# Patient Record
Sex: Female | Born: 1935 | Race: White | Hispanic: No | Smoking: Never smoker
Health system: Southern US, Community
[De-identification: ages and names within clinical notes are randomized; demographics above are authoritative.]

## PROBLEM LIST (undated history)

## (undated) DIAGNOSIS — I10 Essential (primary) hypertension: Secondary | ICD-10-CM

## (undated) DIAGNOSIS — I4891 Unspecified atrial fibrillation: Secondary | ICD-10-CM

## (undated) HISTORY — DX: Unspecified atrial fibrillation: I48.91

## (undated) HISTORY — DX: Essential (primary) hypertension: I10

---

## 2004-07-19 ENCOUNTER — Ambulatory Visit: Payer: Self-pay

## 2005-03-27 ENCOUNTER — Ambulatory Visit: Payer: Self-pay

## 2005-10-17 ENCOUNTER — Ambulatory Visit: Payer: Self-pay | Admitting: Internal Medicine

## 2010-09-21 DIAGNOSIS — M1990 Unspecified osteoarthritis, unspecified site: Secondary | ICD-10-CM | POA: Insufficient documentation

## 2010-09-21 DIAGNOSIS — M353 Polymyalgia rheumatica: Secondary | ICD-10-CM | POA: Insufficient documentation

## 2010-12-12 ENCOUNTER — Inpatient Hospital Stay: Payer: Self-pay | Admitting: Internal Medicine

## 2010-12-20 ENCOUNTER — Encounter: Payer: Self-pay | Admitting: Internal Medicine

## 2010-12-31 ENCOUNTER — Encounter: Payer: Self-pay | Admitting: Internal Medicine

## 2011-01-31 ENCOUNTER — Encounter: Payer: Self-pay | Admitting: Internal Medicine

## 2011-03-03 ENCOUNTER — Encounter: Payer: Self-pay | Admitting: Internal Medicine

## 2011-08-03 DIAGNOSIS — E559 Vitamin D deficiency, unspecified: Secondary | ICD-10-CM | POA: Insufficient documentation

## 2012-12-22 IMAGING — CT CT HEAD WITHOUT CONTRAST
1 series · 16 of 30 positions shown, 20 images · non-contrast
Comparison: none

REASON FOR EXAM: RLE>RUE weakness
COMMENTS:

PROCEDURE:     CT  - CT HEAD WITHOUT CONTRAST  - December 12, 2010  [DATE]
RESULT:     Comparison:  None
TECHNIQUE: Multiple axial images from the foramen magnum to the vertex were
obtained without IV contrast.

[Series 2: soft tissue · axial · 0.42mm/px · z∈[+888,+1022]mm · 16 of 31 slices shown, 20 images]
[im 2/31  brain]
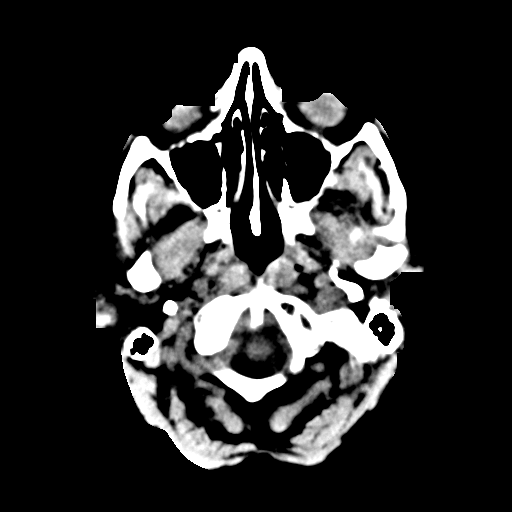
[im 2/31  bone]
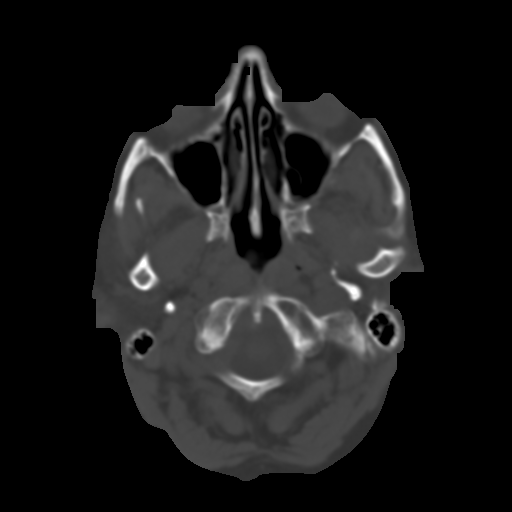
[im 4/31  brain]
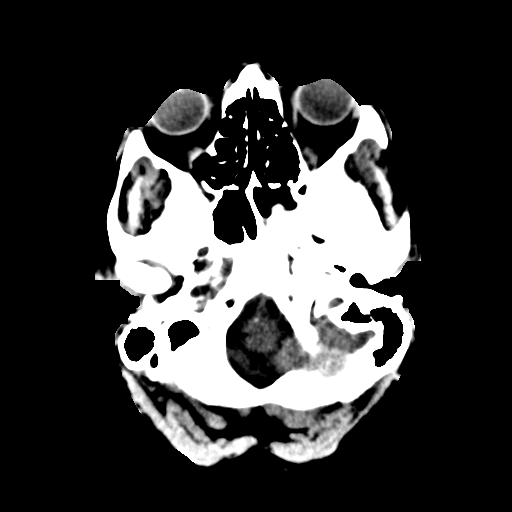
[im 6/31  brain]
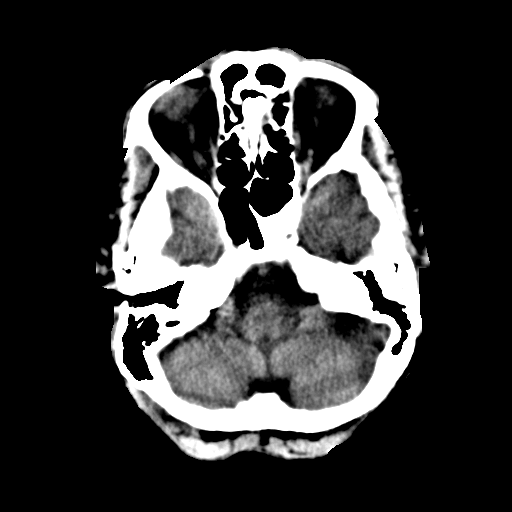
[im 8/31  brain]
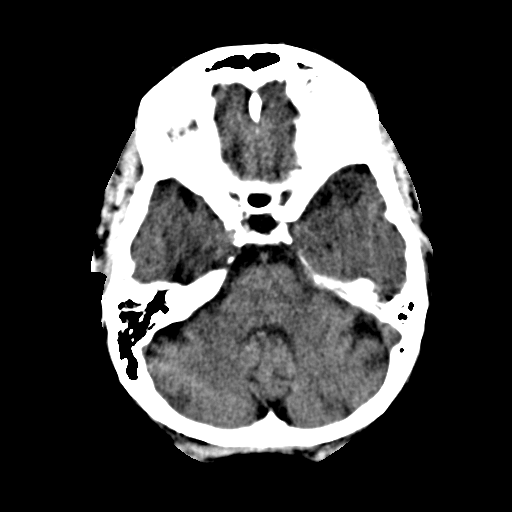
[im 9/31  brain]
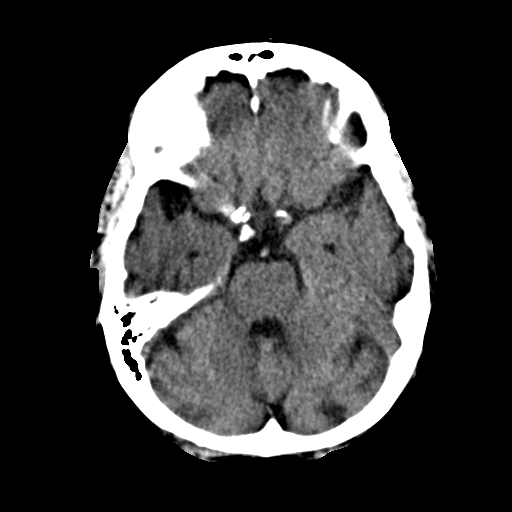
[im 9/31  bone]
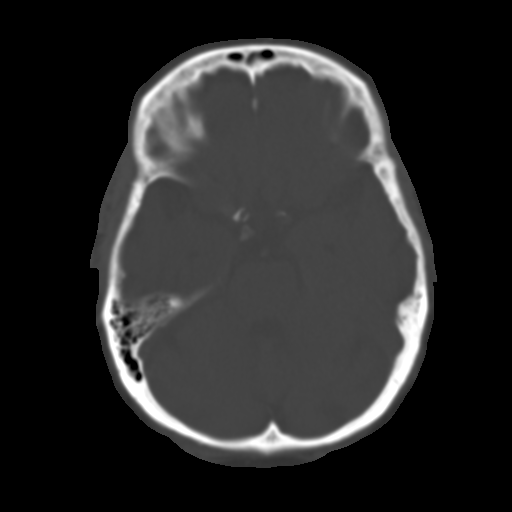
[im 11/31  brain]
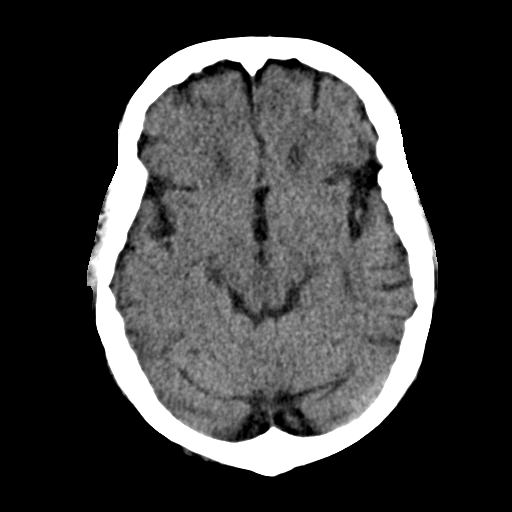
[im 13/31  brain]
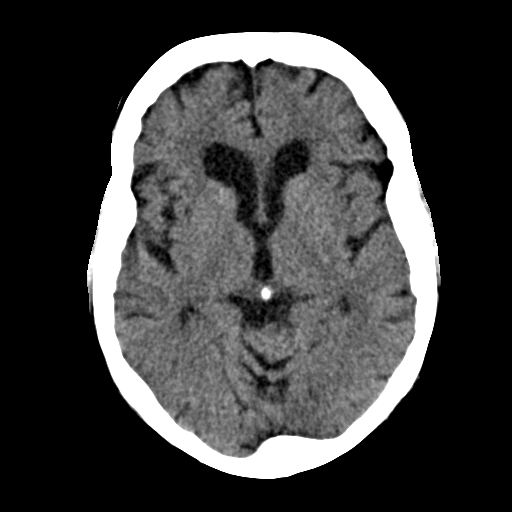
[im 15/31  brain]
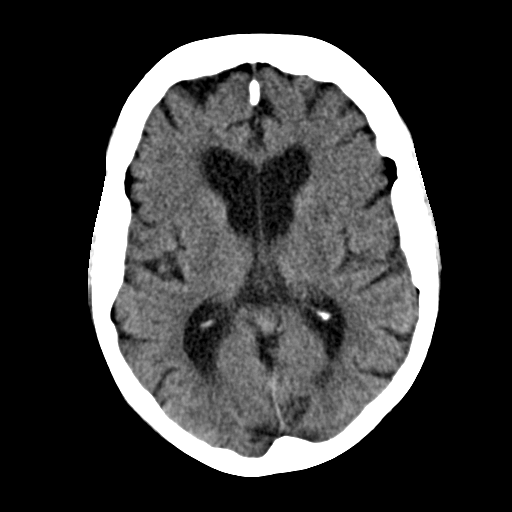
[im 16/31  brain]
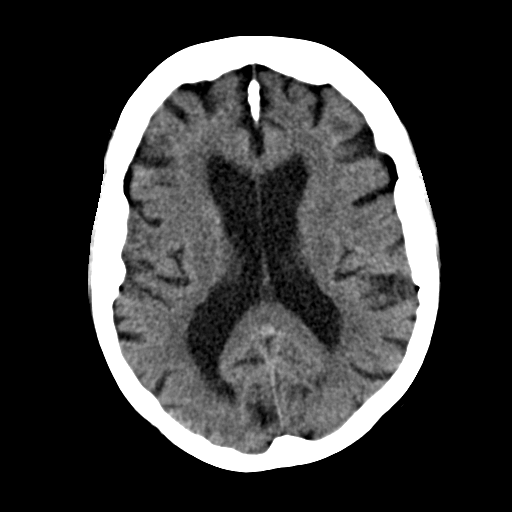
[im 16/31  bone]
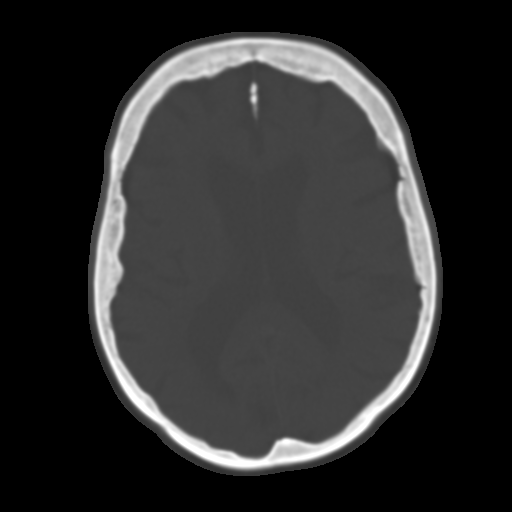
[im 18/31  brain]
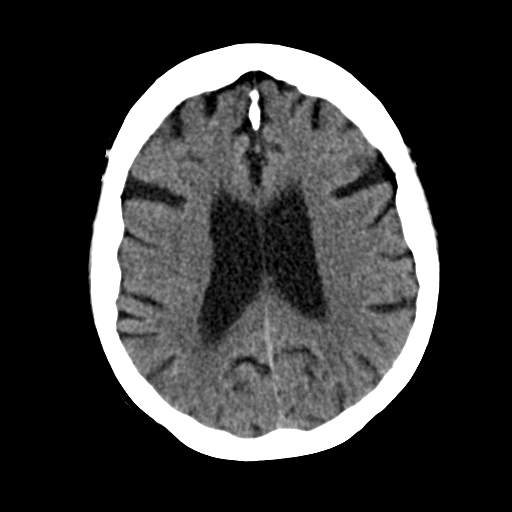
[im 20/31  brain]
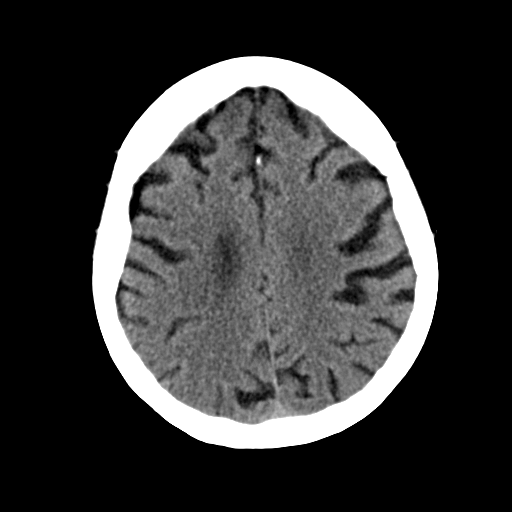
[im 22/31  brain]
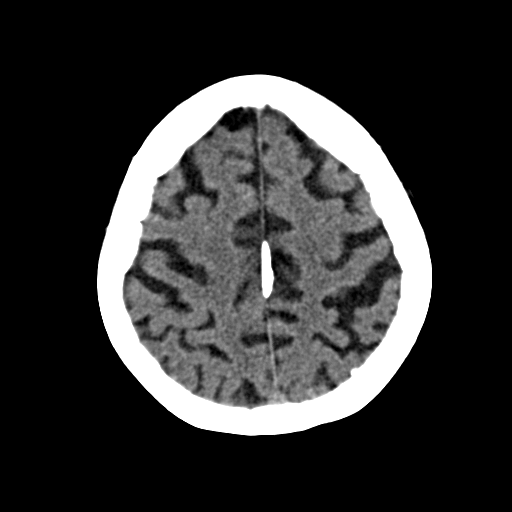
[im 23/31  brain]
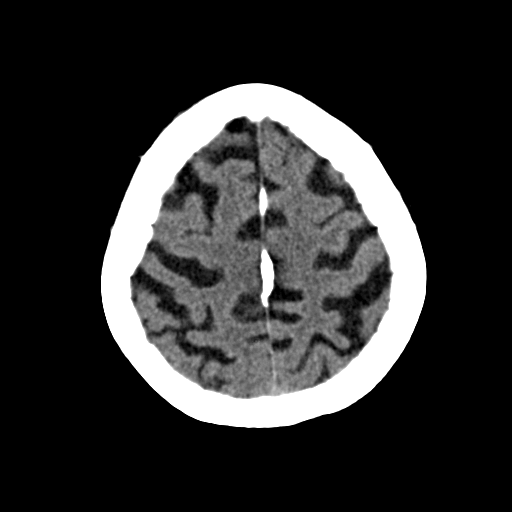
[im 23/31  bone]
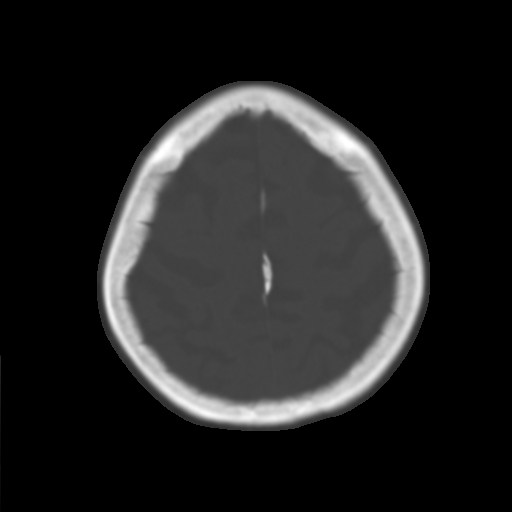
[im 25/31  brain]
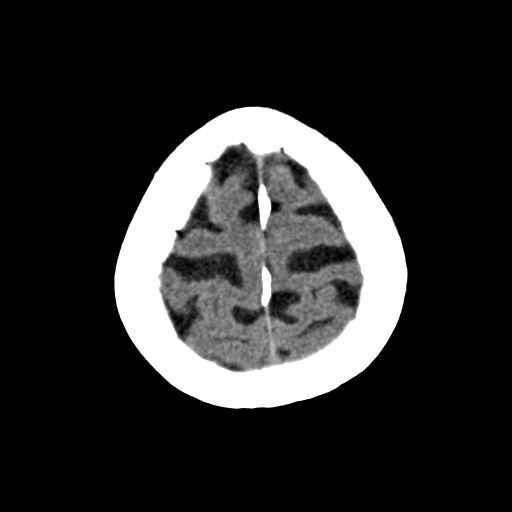
[im 27/31  brain]
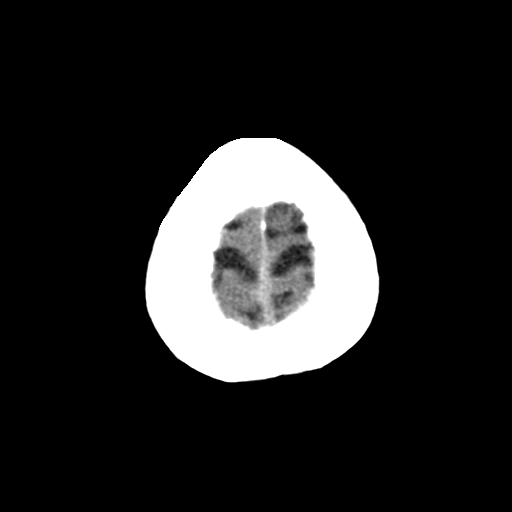
[im 29/31  brain]
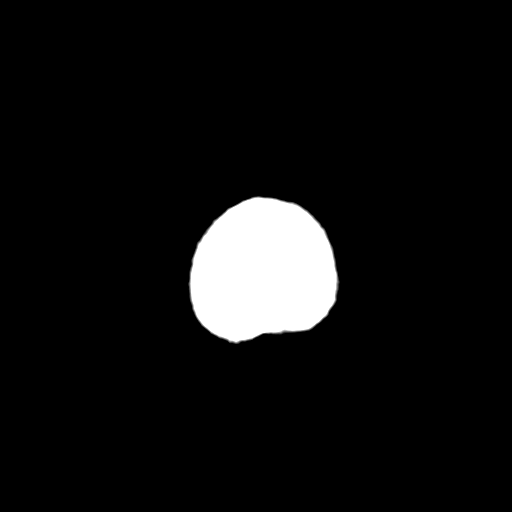

[16 of 30 positions shown; findings below may reference images not displayed]

FINDINGS: There is no evidence of mass effect, midline shift, or extra-axial fluid
collections.  There is no evidence of a space-occupying lesion or
intracranial hemorrhage. There is no evidence of a cortical-based area of
acute infarction. There is generalized cerebral atrophy. There is
periventricular white matter low attenuation likely secondary to
microangiopathy.

The ventricles and sulci are appropriate for the patient's age. The basal
cisterns are patent.

Visualized portions of the orbits are unremarkable. There is a small
air-fluid level in the left sphenoid sinus. Cerebrovascular atherosclerotic
calcifications are noted.

The osseous structures are unremarkable.
IMPRESSION: No acute intracranial process.

## 2013-03-08 ENCOUNTER — Emergency Department: Payer: Self-pay | Admitting: Emergency Medicine

## 2013-03-08 LAB — CBC
HCT: 41.9 % (ref 35.0–47.0)
HGB: 14.1 g/dL (ref 12.0–16.0)
MCH: 33.9 pg (ref 26.0–34.0)
MCHC: 33.7 g/dL (ref 32.0–36.0)
MCV: 101 fL — AB (ref 80–100)
PLATELETS: 213 10*3/uL (ref 150–440)
RBC: 4.16 10*6/uL (ref 3.80–5.20)
RDW: 14.2 % (ref 11.5–14.5)
WBC: 14.4 10*3/uL — ABNORMAL HIGH (ref 3.6–11.0)

## 2013-03-08 LAB — COMPREHENSIVE METABOLIC PANEL
ALK PHOS: 89 U/L
ALT: 25 U/L (ref 12–78)
Albumin: 3.7 g/dL (ref 3.4–5.0)
Anion Gap: 3 — ABNORMAL LOW (ref 7–16)
BILIRUBIN TOTAL: 1.1 mg/dL — AB (ref 0.2–1.0)
BUN: 25 mg/dL — ABNORMAL HIGH (ref 7–18)
CALCIUM: 9.1 mg/dL (ref 8.5–10.1)
Chloride: 109 mmol/L — ABNORMAL HIGH (ref 98–107)
Co2: 25 mmol/L (ref 21–32)
Creatinine: 1.09 mg/dL (ref 0.60–1.30)
EGFR (Non-African Amer.): 46 — ABNORMAL LOW
GFR CALC AF AMER: 53 — AB
Glucose: 101 mg/dL — ABNORMAL HIGH (ref 65–99)
Osmolality: 278 (ref 275–301)
POTASSIUM: 4.3 mmol/L (ref 3.5–5.1)
SGOT(AST): 37 U/L (ref 15–37)
SODIUM: 137 mmol/L (ref 136–145)
Total Protein: 7.1 g/dL (ref 6.4–8.2)

## 2013-03-08 LAB — URINALYSIS, COMPLETE
Bilirubin,UR: NEGATIVE
GLUCOSE, UR: NEGATIVE mg/dL (ref 0–75)
Ketone: NEGATIVE
Leukocyte Esterase: NEGATIVE
Nitrite: NEGATIVE
PH: 5 (ref 4.5–8.0)
Protein: 30
SPECIFIC GRAVITY: 1.017 (ref 1.003–1.030)

## 2013-03-08 LAB — LIPASE, BLOOD: Lipase: 93 U/L (ref 73–393)

## 2013-03-10 LAB — STOOL CULTURE

## 2015-01-17 ENCOUNTER — Ambulatory Visit (HOSPITAL_BASED_OUTPATIENT_CLINIC_OR_DEPARTMENT_OTHER): Payer: Medicare PPO | Attending: Family Medicine | Admitting: *Deleted

## 2015-01-17 VITALS — Ht 64.0 in | Wt 206.0 lb

## 2015-01-17 DIAGNOSIS — I493 Ventricular premature depolarization: Secondary | ICD-10-CM | POA: Insufficient documentation

## 2015-01-17 DIAGNOSIS — R0683 Snoring: Secondary | ICD-10-CM | POA: Diagnosis not present

## 2015-01-17 DIAGNOSIS — G4733 Obstructive sleep apnea (adult) (pediatric): Secondary | ICD-10-CM | POA: Diagnosis not present

## 2015-01-17 DIAGNOSIS — I4891 Unspecified atrial fibrillation: Secondary | ICD-10-CM | POA: Insufficient documentation

## 2015-01-17 DIAGNOSIS — R063 Periodic breathing: Secondary | ICD-10-CM | POA: Insufficient documentation

## 2015-01-17 DIAGNOSIS — R5383 Other fatigue: Secondary | ICD-10-CM | POA: Diagnosis not present

## 2015-01-30 DIAGNOSIS — G4733 Obstructive sleep apnea (adult) (pediatric): Secondary | ICD-10-CM | POA: Diagnosis not present

## 2015-01-30 NOTE — Progress Notes (Signed)
Patient Name: Cynthia Terry, Cynthia Terry Date: 01/17/2015 Gender: Female D.O.B: [birth year] Age (years): 22 Referring Provider: Guy Begin Olmedo Height (inches): 64 Interpreting Physician: Baird Lyons MD, ABSM Weight (lbs): 206 RPSGT: Gerhard Perches BMI: 35 MRN: [id number] Neck Size: 15.00 CLINICAL INFORMATION The patient is referred for a split night study with BPAP.  MEDICATIONS Medications taken by the patient : charted for review Medications administered by patient during sleep study : No sleep medicine administered.  SLEEP STUDY TECHNIQUE As per the AASM Manual for the Scoring of Sleep and Associated Events v2.3 (April 2016) with a hypopnea requiring 4% desaturations. The channels recorded and monitored were frontal, central and occipital EEG, electrooculogram (EOG), submentalis EMG (chin), nasal and oral airflow, thoracic and abdominal wall motion, anterior tibialis EMG, snore microphone, electrocardiogram, and pulse oximetry. Bi-level positive airway pressure (BiPAP) was initiated when the patient met split night criteria and was titrated according to treat sleep-disordered breathing.  RESPIRATORY PARAMETERS Diagnostic Total AHI (/hr): 61.0 RDI (/hr): 62.2 OA Index (/hr): 24.7 CA Index (/hr): 18.6 REM AHI (/hr): N/A NREM AHI (/hr): 61.0 Supine AHI (/hr): 58.9 Non-supine AHI (/hr): 63.54 Min O2 Sat (%): 85.00 Mean O2 (%): 92.95 Time below 88% (min): 6.2   Titration Optimal IPAP Pressure (cm): 17 Optimal EPAP Pressure (cm): 13 AHI at Optimal Pressure (/hr): 1.0 Min O2 at Optimal Pressure (%): 86.0 Sleep % at Optimal (%): 100 Supine % at Optimal (%): 100      SLEEP ARCHITECTURE The study was initiated at 11:18:55 PM and terminated at 6:14:50 AM. The total recorded time was 415.9 minutes. EEG confirmed total sleep time was 362.4 minutes yielding a sleep efficiency of 87.1%. Sleep onset after lights out was 11.5 minutes with a REM latency of 330.0 minutes. The patient spent  7.17% of the night in stage N1 sleep, 72.29% in stage N2 sleep, 0.00% in stage N3 and 20.54% in REM. Wake after sleep onset (WASO) was 42.0 minutes. The Arousal Index was 38.4/hour.  LEG MOVEMENT DATA The total Periodic Limb Movements of Sleep (PLMS) were 15. The PLMS index was 2.48 .  CARDIAC DATA The 2 lead EKG demonstrated pacemaker generated. The mean heart rate was 60.70 beats per minute. Other EKG findings include: Atrial Fibrillation, PVCs.  IMPRESSIONS - Severe  obstructive and ceentral sleep apnea occurred during the diagnostic portion of the study (AHI = 61.0 /hour). An optimal BiPAP pressure was selected for this patient ( 17 / 13 cm of water) - CPAP proved insufficient for control of the central apnea component and  The technician changed to BiPAP per protocol. - Severe central sleep apnea occurred during the diagnostic portion of the study (CAI = 18.6/hour). Cheyne-Stokes respiration noted. - Moderate oxygen desaturation was noted during the diagnostic portion of the study (Min O2 = 85.00%). - The patient snored with Soft snoring volume during the diagnostic portion of the study. - EKG findings include Atrial Fibrillation, PVCs. - Clinically significant periodic limb movements of sleep did not occur during the study.  DIAGNOSIS - Obstructive Sleep Apnea (327.23 [G47.33 ICD-10])  RECOMMENDATIONS - Trial of BiPAP therapy on 17/13 cm H2O with a Small size Resmed Full Face Mask Mirage [place] mask and heated humidification. - Avoid alcohol, sedatives and other CNS depressants that may worsen sleep apnea and disrupt normal sleep architecture. - Sleep hygiene should be reviewed to assess factors that may improve sleep quality. - Weight management and regular exercise should be initiated or continued. - Return to Sleep Center for re-evaluation.  Deneise Lever Diplomate, American Board of Sleep Medicine  ELECTRONICALLY SIGNED ON:  01/30/2015, 10:12 AM [facility] PH: [phone]   FX: [phone] [place]

## 2015-03-09 ENCOUNTER — Encounter: Payer: Self-pay | Admitting: Podiatry

## 2015-03-09 ENCOUNTER — Ambulatory Visit (INDEPENDENT_AMBULATORY_CARE_PROVIDER_SITE_OTHER): Payer: Medicare HMO | Admitting: Podiatry

## 2015-03-09 ENCOUNTER — Ambulatory Visit (INDEPENDENT_AMBULATORY_CARE_PROVIDER_SITE_OTHER): Payer: Medicare HMO

## 2015-03-09 DIAGNOSIS — L03032 Cellulitis of left toe: Secondary | ICD-10-CM | POA: Diagnosis not present

## 2015-03-09 DIAGNOSIS — R52 Pain, unspecified: Secondary | ICD-10-CM

## 2015-03-09 DIAGNOSIS — M109 Gout, unspecified: Secondary | ICD-10-CM | POA: Diagnosis not present

## 2015-03-09 MED ORDER — CEPHALEXIN 500 MG PO CAPS: 500.0000 mg | ORAL_CAPSULE | Freq: Three times a day (TID) | ORAL | Status: DC

## 2015-03-09 NOTE — Progress Notes (Signed)
   Subjective:    Patient ID: Cynthia Terry, female    DOB: 05/01/1924, 82 y.o.   MRN: [id number]  HPI  82 year old female presents the office with her son for concerns of left third toe swelling or redness which is been ongoing for the last month or so. She states that she has pain as he had multiple gout attacks to her left foot however more recently she states that she did develop a cut to her left third toe and the area didn't bleed and since then the toe has been somewhat red and swollen. She has not been on any antibiotics recently. There is been no other injury or trauma. She does take allopurinol daily for gout. She denies any change in her diet recently or other complaints.   Review of Systems  All other systems reviewed and are negative.      Objective:   Physical Exam General: AAO x3, NAD  Dermatological: On the left third toe there is erythema and edema to the distal portion of the toe. There is no open sore or any drainage from around the toenail or drainage to the toe. There is no ascending saline's. There is no fluctuance or crepitus. No malodor.  Vascular: Dorsalis Pedis artery and Posterior Tibial artery pedal pulses are 2/4 bilateral with immedate capillary fill time. Pedal hair growth present. No varicosities and no lower extremity edema present bilateral. There is no pain with calf compression, swelling, warmth, erythema.   Neruologic: Grossly intact via light touch bilateral. Vibratory intact via tuning fork bilateral. Protective threshold with Semmes Wienstein monofilament intact to all pedal sites bilateral. Patellar and Achilles deep tendon reflexes 2+ bilateral. No Babinski or clonus noted bilateral.   Musculoskeletal: There is plantarflexion the metatarsal heads and atrophy of the fat pad. There is hammertoe contractures present and there is apparent dorsal subluxation of the first MTPJ. HAV is present. There is no other areas of tenderness to bilateral lower  extremities.  Gait: Unassisted, Nonantalgic.      Assessment & Plan:  82 year old female left third toe cellulitis versus gout -Treatment options discussed including all alternatives, risks, and complications -X-rays were obtained and reviewed with the patient. There is extensive arthritic changes present and subluxation chronically the first MTPJ. There is chronic changes to the distal phalanx of the third toe likely consistent with either chronic osteomyelitis or gouty changes. For now we will go ahead and start Keflex for infection as this appears to be more likely given her history of a cut. -Offloading pads were dispensed. -Monitor for any clinical signs or symptoms of infection and directed to call the office immediately should any occur or go to the ER. -Follow-up as scheduled or sooner if any problems arise. In the meantime, encouraged to call the office with any questions, concerns, change in symptoms.   Celesta Gentile, DPM

## 2015-03-30 ENCOUNTER — Encounter: Payer: Self-pay | Admitting: Podiatry

## 2015-03-30 ENCOUNTER — Ambulatory Visit (INDEPENDENT_AMBULATORY_CARE_PROVIDER_SITE_OTHER): Payer: Medicare HMO | Admitting: Podiatry

## 2015-03-30 VITALS — BP 165/86 | HR 86 | Resp 18

## 2015-03-30 DIAGNOSIS — M109 Gout, unspecified: Secondary | ICD-10-CM | POA: Diagnosis not present

## 2015-03-31 NOTE — Progress Notes (Signed)
Patient ID: Cynthia Terry, female   DOB: [birth year], 82 y.o.   MRN: [id number]  Subjective: 82 year old female presents the office today for follow-up evaluation of left third toe swelling, redness. She states of this has been ongoing for several months. She states there is been no wound she's had no drainage his last appointment. She states that at times it gets more red and then will resolve. She believes it is gout. Ensure antibiotics. She has not noticed her difference. No other complaints at this time.  Objective: General: AAO x3, NAD  Dermatological: On the left third toe there is erythema and edema to the distal portion of the toe. There is no open sore or any drainage from around the toenail or drainage to the toe. There is no ascending saline's. There is no fluctuance or crepitus. No malodor. Exam appears to be mostly unchanged.   Vascular: Dorsalis Pedis artery and Posterior Tibial artery pedal pulses are 2/4 bilateral with immedate capillary fill time. Pedal hair growth present. No varicosities and no lower extremity edema present bilateral. There is no pain with calf compression, swelling, warmth, erythema.   Neruologic: Grossly intact via light touch bilateral. Vibratory intact via tuning fork bilateral. Protective threshold with Semmes Wienstein monofilament intact to all pedal sites bilateral. Patellar and Achilles deep tendon reflexes 2+ bilateral. No Babinski or clonus noted bilateral.   Musculoskeletal: There is plantarflexion the metatarsal heads and atrophy of the fat pad. There is hammertoe contractures present and there is apparent dorsal subluxation of the first MTPJ. HAV is present. There is no other areas of tenderness to bilateral lower extremities.  Gait: Unassisted, Nonantalgic.   Assessment: Likely chronic gout to the left third toe however cannot rule out infection.   Plan: -Treatment options discussed including all alternatives, risks, and complications -X-ray  findings to should distraction to the distal phalanx of the third toe however this is likely due to gout as opposed to infection. Antibiotics did not seem to help as well. His been ongoing for quite some time and I believe that this is likely due to chronic gout.  -Discussed treatment options for her however given her other medications, cost of colchicine she is not taking any other gout medicine. We'll discuss this with her primary care physician as well. -Follow-up with me if the symptoms are not resolved in the next 3-4 weeks or sooner for any worsening or change in symptoms.  Celesta Gentile, DPM

## 2015-04-01 ENCOUNTER — Telehealth: Payer: Self-pay | Admitting: *Deleted

## 2015-04-01 NOTE — Telephone Encounter (Addendum)
-----   Message from Trula Slade, DPM sent at 03/31/2015  1:53 PM EST ----- Can you call her and see who her PCP is and fax the last clinic note? It's not in the system (or at least I cannot see it). I told her I would talk to the PCP  04/01/2015-I SPOKE WITH PT, she sees Dr. Larita Fife at [facility] at [place] [phone]. I spoke with [place], she states Dr. Johny Drilling is with their facility, fax [phone].  Faxed last clinical notes to Dr. Kym Groom.

## 2015-04-09 LAB — WOUND CULTURE: ORGANISM ID, BACTERIA: NONE SEEN

## 2015-09-13 DIAGNOSIS — I272 Pulmonary hypertension, unspecified: Secondary | ICD-10-CM | POA: Insufficient documentation

## 2016-02-07 DIAGNOSIS — D485 Neoplasm of uncertain behavior of skin: Secondary | ICD-10-CM | POA: Diagnosis not present

## 2016-02-07 DIAGNOSIS — C44622 Squamous cell carcinoma of skin of right upper limb, including shoulder: Secondary | ICD-10-CM | POA: Diagnosis not present

## 2016-03-08 DIAGNOSIS — C44622 Squamous cell carcinoma of skin of right upper limb, including shoulder: Secondary | ICD-10-CM | POA: Diagnosis not present

## 2016-03-08 DIAGNOSIS — L570 Actinic keratosis: Secondary | ICD-10-CM | POA: Diagnosis not present

## 2016-03-09 DIAGNOSIS — I440 Atrioventricular block, first degree: Secondary | ICD-10-CM | POA: Diagnosis not present

## 2016-03-29 DIAGNOSIS — L820 Inflamed seborrheic keratosis: Secondary | ICD-10-CM | POA: Diagnosis not present

## 2016-03-29 DIAGNOSIS — C44622 Squamous cell carcinoma of skin of right upper limb, including shoulder: Secondary | ICD-10-CM | POA: Diagnosis not present

## 2016-04-17 DIAGNOSIS — R0602 Shortness of breath: Secondary | ICD-10-CM | POA: Diagnosis not present

## 2016-04-17 DIAGNOSIS — I340 Nonrheumatic mitral (valve) insufficiency: Secondary | ICD-10-CM | POA: Diagnosis not present

## 2016-04-17 DIAGNOSIS — I071 Rheumatic tricuspid insufficiency: Secondary | ICD-10-CM | POA: Diagnosis not present

## 2016-04-17 DIAGNOSIS — I272 Pulmonary hypertension, unspecified: Secondary | ICD-10-CM | POA: Diagnosis not present

## 2016-04-17 DIAGNOSIS — I10 Essential (primary) hypertension: Secondary | ICD-10-CM | POA: Diagnosis not present

## 2016-04-17 DIAGNOSIS — I495 Sick sinus syndrome: Secondary | ICD-10-CM | POA: Diagnosis not present

## 2016-04-17 DIAGNOSIS — I480 Paroxysmal atrial fibrillation: Secondary | ICD-10-CM | POA: Diagnosis not present

## 2016-06-15 DIAGNOSIS — K219 Gastro-esophageal reflux disease without esophagitis: Secondary | ICD-10-CM | POA: Diagnosis not present

## 2016-06-15 DIAGNOSIS — R609 Edema, unspecified: Secondary | ICD-10-CM | POA: Diagnosis not present

## 2016-06-15 DIAGNOSIS — M1000 Idiopathic gout, unspecified site: Secondary | ICD-10-CM | POA: Diagnosis not present

## 2016-06-15 DIAGNOSIS — F419 Anxiety disorder, unspecified: Secondary | ICD-10-CM | POA: Diagnosis not present

## 2016-06-15 DIAGNOSIS — I10 Essential (primary) hypertension: Secondary | ICD-10-CM | POA: Diagnosis not present

## 2016-07-17 DIAGNOSIS — R0602 Shortness of breath: Secondary | ICD-10-CM | POA: Diagnosis not present

## 2016-07-17 DIAGNOSIS — I480 Paroxysmal atrial fibrillation: Secondary | ICD-10-CM | POA: Diagnosis not present

## 2016-07-17 DIAGNOSIS — I071 Rheumatic tricuspid insufficiency: Secondary | ICD-10-CM | POA: Diagnosis not present

## 2016-07-17 DIAGNOSIS — I272 Pulmonary hypertension, unspecified: Secondary | ICD-10-CM | POA: Diagnosis not present

## 2016-07-17 DIAGNOSIS — I340 Nonrheumatic mitral (valve) insufficiency: Secondary | ICD-10-CM | POA: Diagnosis not present

## 2016-07-17 DIAGNOSIS — I10 Essential (primary) hypertension: Secondary | ICD-10-CM | POA: Diagnosis not present

## 2016-07-17 DIAGNOSIS — I495 Sick sinus syndrome: Secondary | ICD-10-CM | POA: Diagnosis not present

## 2016-08-09 DIAGNOSIS — L570 Actinic keratosis: Secondary | ICD-10-CM | POA: Diagnosis not present

## 2016-08-09 DIAGNOSIS — L918 Other hypertrophic disorders of the skin: Secondary | ICD-10-CM | POA: Diagnosis not present

## 2016-08-09 DIAGNOSIS — L821 Other seborrheic keratosis: Secondary | ICD-10-CM | POA: Diagnosis not present

## 2016-09-14 DIAGNOSIS — I440 Atrioventricular block, first degree: Secondary | ICD-10-CM | POA: Diagnosis not present

## 2016-09-18 DIAGNOSIS — M1000 Idiopathic gout, unspecified site: Secondary | ICD-10-CM | POA: Diagnosis not present

## 2016-09-18 DIAGNOSIS — R7981 Abnormal blood-gas level: Secondary | ICD-10-CM | POA: Diagnosis not present

## 2016-09-18 DIAGNOSIS — M2550 Pain in unspecified joint: Secondary | ICD-10-CM | POA: Diagnosis not present

## 2016-09-18 DIAGNOSIS — I10 Essential (primary) hypertension: Secondary | ICD-10-CM | POA: Diagnosis not present

## 2016-09-18 DIAGNOSIS — E559 Vitamin D deficiency, unspecified: Secondary | ICD-10-CM | POA: Diagnosis not present

## 2016-09-18 DIAGNOSIS — R609 Edema, unspecified: Secondary | ICD-10-CM | POA: Diagnosis not present

## 2016-09-18 DIAGNOSIS — R05 Cough: Secondary | ICD-10-CM | POA: Diagnosis not present

## 2016-09-18 DIAGNOSIS — R531 Weakness: Secondary | ICD-10-CM | POA: Diagnosis not present

## 2016-09-18 DIAGNOSIS — R0602 Shortness of breath: Secondary | ICD-10-CM | POA: Diagnosis not present

## 2016-09-18 DIAGNOSIS — F419 Anxiety disorder, unspecified: Secondary | ICD-10-CM | POA: Diagnosis not present

## 2016-09-21 DIAGNOSIS — R0602 Shortness of breath: Secondary | ICD-10-CM | POA: Diagnosis not present

## 2016-10-04 DIAGNOSIS — R600 Localized edema: Secondary | ICD-10-CM | POA: Diagnosis not present

## 2016-10-04 DIAGNOSIS — I480 Paroxysmal atrial fibrillation: Secondary | ICD-10-CM | POA: Diagnosis not present

## 2016-10-04 DIAGNOSIS — I495 Sick sinus syndrome: Secondary | ICD-10-CM | POA: Diagnosis not present

## 2016-10-04 DIAGNOSIS — I10 Essential (primary) hypertension: Secondary | ICD-10-CM | POA: Diagnosis not present

## 2016-10-18 DIAGNOSIS — R05 Cough: Secondary | ICD-10-CM | POA: Diagnosis not present

## 2016-10-18 DIAGNOSIS — M353 Polymyalgia rheumatica: Secondary | ICD-10-CM | POA: Diagnosis not present

## 2016-10-18 DIAGNOSIS — J449 Chronic obstructive pulmonary disease, unspecified: Secondary | ICD-10-CM | POA: Insufficient documentation

## 2016-10-18 DIAGNOSIS — J441 Chronic obstructive pulmonary disease with (acute) exacerbation: Secondary | ICD-10-CM | POA: Diagnosis not present

## 2016-10-22 DIAGNOSIS — R0602 Shortness of breath: Secondary | ICD-10-CM | POA: Diagnosis not present

## 2016-11-21 DIAGNOSIS — R0602 Shortness of breath: Secondary | ICD-10-CM | POA: Diagnosis not present

## 2016-11-23 DIAGNOSIS — Z23 Encounter for immunization: Secondary | ICD-10-CM | POA: Diagnosis not present

## 2016-11-23 DIAGNOSIS — M353 Polymyalgia rheumatica: Secondary | ICD-10-CM | POA: Diagnosis not present

## 2016-11-23 DIAGNOSIS — J411 Mucopurulent chronic bronchitis: Secondary | ICD-10-CM | POA: Diagnosis not present

## 2016-11-23 DIAGNOSIS — L97529 Non-pressure chronic ulcer of other part of left foot with unspecified severity: Secondary | ICD-10-CM | POA: Diagnosis not present

## 2016-12-11 ENCOUNTER — Encounter: Payer: PPO | Attending: Surgery | Admitting: Surgery

## 2016-12-11 ENCOUNTER — Ambulatory Visit
Admission: RE | Admit: 2016-12-11 | Discharge: 2016-12-11 | Disposition: A | Discharge status: Home / Self Care | Payer: PPO | Source: Ambulatory Visit | Attending: Surgery | Admitting: Surgery

## 2016-12-11 ENCOUNTER — Other Ambulatory Visit: Payer: Self-pay | Admitting: Surgery

## 2016-12-11 DIAGNOSIS — M353 Polymyalgia rheumatica: Secondary | ICD-10-CM | POA: Diagnosis not present

## 2016-12-11 DIAGNOSIS — Z79899 Other long term (current) drug therapy: Secondary | ICD-10-CM | POA: Insufficient documentation

## 2016-12-11 DIAGNOSIS — S81802A Unspecified open wound, left lower leg, initial encounter: Secondary | ICD-10-CM | POA: Insufficient documentation

## 2016-12-11 DIAGNOSIS — S81801A Unspecified open wound, right lower leg, initial encounter: Secondary | ICD-10-CM | POA: Diagnosis not present

## 2016-12-11 DIAGNOSIS — I509 Heart failure, unspecified: Secondary | ICD-10-CM | POA: Diagnosis not present

## 2016-12-11 DIAGNOSIS — M19072 Primary osteoarthritis, left ankle and foot: Secondary | ICD-10-CM | POA: Diagnosis not present

## 2016-12-11 DIAGNOSIS — M10072 Idiopathic gout, left ankle and foot: Secondary | ICD-10-CM | POA: Diagnosis not present

## 2016-12-11 DIAGNOSIS — L97521 Non-pressure chronic ulcer of other part of left foot limited to breakdown of skin: Secondary | ICD-10-CM | POA: Diagnosis not present

## 2016-12-11 DIAGNOSIS — I110 Hypertensive heart disease with heart failure: Secondary | ICD-10-CM | POA: Diagnosis not present

## 2016-12-11 DIAGNOSIS — M79671 Pain in right foot: Secondary | ICD-10-CM | POA: Diagnosis not present

## 2016-12-11 DIAGNOSIS — X58XXXA Exposure to other specified factors, initial encounter: Secondary | ICD-10-CM | POA: Insufficient documentation

## 2016-12-11 DIAGNOSIS — M79672 Pain in left foot: Secondary | ICD-10-CM | POA: Diagnosis not present

## 2016-12-11 DIAGNOSIS — M10071 Idiopathic gout, right ankle and foot: Secondary | ICD-10-CM | POA: Diagnosis not present

## 2016-12-11 DIAGNOSIS — Z7952 Long term (current) use of systemic steroids: Secondary | ICD-10-CM | POA: Diagnosis not present

## 2016-12-11 DIAGNOSIS — L97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 layer exposed: Secondary | ICD-10-CM | POA: Diagnosis not present

## 2016-12-11 DIAGNOSIS — J449 Chronic obstructive pulmonary disease, unspecified: Secondary | ICD-10-CM | POA: Insufficient documentation

## 2016-12-11 DIAGNOSIS — L89892 Pressure ulcer of other site, stage 2: Secondary | ICD-10-CM | POA: Diagnosis not present

## 2016-12-11 NOTE — Progress Notes (Addendum)
HECTOR, VENNE ([id number]) Visit Report for 12/11/2016 Chief Complaint Document Details Patient Name: Cynthia Terry, Cynthia Terry Date of Service: 12/11/2016 12:30 PM Medical Record Number: [id number] Patient Account Number: [id number] Date of Birth/Sex: September 24, 1924 (83 y.o. Female) Treating RN: Cornell Barman Primary Care Provider: Johny Drilling Other Clinician: Referring Provider: Johny Drilling Treating Provider/Extender: Frann Rider in Treatment: 0 Information Obtained from: Patient Chief Complaint Patient seen for complaints of Non-Healing Wound to the left plantar metatarsal region and the right dorsum of her third toe which she's had for about a month Electronic Signature(s) Signed: 12/11/2016 2:16:10 PM By: Christin Fudge MD, FACS Entered By: Christin Fudge on 12/11/2016 14:16:09 Cynthia Terry ([id number]) -------------------------------------------------------------------------------- Debridement Details Patient Name: Cynthia Terry Date of Service: 12/11/2016 12:30 PM Medical Record Number: [id number] Patient Account Number: [id number] Date of Birth/Sex: October 22, 1924 (83 y.o. Female) Treating RN: Cornell Barman Primary Care Provider: Johny Drilling Other Clinician: Referring Provider: Johny Drilling Treating Provider/Extender: Frann Rider in Treatment: 0 Debridement Performed for Wound #1 Left,Plantar Foot Assessment: Performed By: Physician Christin Fudge, MD Debridement: Debridement Pre-procedure Verification/Time Yes - 13:55 Out Taken: Start Time: 13:56 Pain Control: Other : lidocaine 4% Level: Skin/Subcutaneous Tissue Total Area Debrided (L x W): 1 (cm) x 1.2 (cm) = 1.2 (cm) Tissue and other material Viable, Non-Viable, Callus, Fibrin/Slough, Subcutaneous debrided: Instrument: Forceps, Scissors Bleeding: Minimum Hemostasis Achieved: Pressure End Time: 13:56 Procedural Pain: 0 Post Procedural Pain: 0 Response to Treatment: Procedure was tolerated well Post  Debridement Measurements of Total Wound Length: (cm) 1 Stage: Category/Stage II Width: (cm) 1.2 Depth: (cm) 0.3 Volume: (cm) 0.283 Character of Wound/Ulcer Post Stable Debridement: Post Procedure Diagnosis Same as Pre-procedure Electronic Signature(s) Signed: 12/11/2016 2:15:39 PM By: Christin Fudge MD, FACS Signed: 12/11/2016 5:45:37 PM By: Gretta Cool, BSN, RN, CWS, Kim RN, BSN Entered By: Christin Fudge on 12/11/2016 14:15:39 Cynthia Terry ([id number]) -------------------------------------------------------------------------------- HPI Details Patient Name: Cynthia Terry Date of Service: 12/11/2016 12:30 PM Medical Record Number: [id number] Patient Account Number: [id number] Date of Birth/Sex: May 07, 1924 (83 y.o. Female) Treating RN: Cornell Barman Primary Care Provider: Johny Drilling Other Clinician: Referring Provider: Johny Drilling Treating Provider/Extender: Frann Rider in Treatment: 0 History of Present Illness Location: plantar aspect of the left first metatarsal head and the dorsum of the right third toe Quality: Patient reports No Pain. Severity: Patient states wound are getting worse. Duration: Patient has had the wound for < 4 weeks prior to presenting for treatment Context: The wound appeared gradually over time Modifying Factors: Other treatment(s) tried include:local care with Neosporin and hydrogen peroxide Associated Signs and Symptoms: Patient reports having:seen a podiatrist over a year or 2 ago and they said they could not do much for her feet HPI Description: 83 year old female referred by her PCP Dr. Johny Drilling, for a ulcer on the left foot which has been there for about a month. They noted a progressive increase in size of the ulceration had not been treating it with peroxide and Neosporin. She also has polymyalgia rheumatica and is on prednisone chronically. She has never been a smoker. Review of her electronic medical records does not reveal any  recent x-ray of her foot nor is there any evidence of arterial studies done. the patient has seen podiatry about 2 years ago and at that time they did not recommend anything specific for her feet and said there was no solution to the problems due to her deformity with gout Electronic Signature(s) Signed: 12/11/2016 2:17:45 PM By: Christin Fudge MD, FACS  Previous Signature: 12/11/2016 1:19:20 PM Version By: Christin Fudge MD, FACS Previous Signature: 12/11/2016 1:17:28 PM Version By: Christin Fudge MD, FACS Previous Signature: 12/11/2016 1:10:43 PM Version By: Christin Fudge MD, FACS Previous Signature: 12/11/2016 1:00:32 PM Version By: Christin Fudge MD, FACS Entered By: Christin Fudge on 12/11/2016 14:17:44 Cynthia Terry ([id number]) -------------------------------------------------------------------------------- Physical Exam Details Patient Name: Cynthia Terry Date of Service: 12/11/2016 12:30 PM Medical Record Number: [id number] Patient Account Number: [id number] Date of Birth/Sex: [birth year] (83 y.o. Female) Treating RN: Cornell Barman Primary Care Provider: Johny Drilling Other Clinician: Referring Provider: Johny Drilling Treating Provider/Extender: Frann Rider in Treatment: 0 Constitutional . Pulse regular. Respirations normal and unlabored. Afebrile. . Eyes Nonicteric. Reactive to light. Ears, Nose, Mouth, and Throat Lips, teeth, and gums WNL.Marland Kitchen Moist mucosa without lesions. Neck supple and nontender. No palpable supraclavicular or cervical adenopathy. Normal sized without goiter. Respiratory WNL. No retractions.. Cardiovascular Pedal Pulses WNL. ABI on the left is 1.0 on the right is 0.81. No clubbing, cyanosis or edema. Chest Breasts symmetical and no nipple discharge.. Breast tissue WNL, no masses, lumps, or tenderness.. Gastrointestinal (GI) Abdomen without masses or tenderness.. No liver or spleen enlargement or tenderness.. Lymphatic No adneopathy. No adenopathy. No  adenopathy. Musculoskeletal Adexa without tenderness or enlargement.. Digits and nails w/o clubbing, cyanosis, infection, petechiae, ischemia, or inflammatory conditions.. Integumentary (Hair, Skin) No suspicious lesions. No crepitus or fluctuance. No peri-wound warmth or erythema. No masses.Marland Kitchen Psychiatric Judgement and insight Intact.. No evidence of depression, anxiety, or agitation.. 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 On the plantar aspect of her first metatarsal head she has a ulcerated area with a lot of surrounding callus which was sharply debrided with tooth forceps and scissors. She has a fairly superficial ulceration on the dorsum of her right toe in the region of the interphalangeal joints, again possibly due to the deformity of her toes rubbing against the footwear Electronic Signature(s) Signed: 12/11/2016 2:19:17 PM By: Christin Fudge MD, FACS Entered By: Christin Fudge on 12/11/2016 14:19:17 Cynthia Terry ([id number]) -------------------------------------------------------------------------------- Physician Orders Details Patient Name: Cynthia Terry Date of Service: 12/11/2016 12:30 PM Medical Record Number: [id number] Patient Account Number: [id number] Date of Birth/Sex: 12/15/1924 (83 y.o. Female) Treating RN: Roger Shelter Primary Care Provider: Johny Drilling Other Clinician: Referring Provider: Johny Drilling Treating Provider/Extender: Frann Rider in Treatment: 0 Verbal / Phone Orders: No Diagnosis Coding Wound Cleansing Wound #1 Left,Plantar Foot o Clean wound with Normal Saline. Wound #2 Right,Dorsal Foot o Clean wound with Normal Saline. Anesthetic Wound #1 Left,Plantar Foot o Topical Lidocaine 4% cream applied to wound bed prior to debridement Wound #2 Right,Dorsal Foot o Topical Lidocaine 4% cream applied to wound bed prior to debridement Primary Wound  Dressing Wound #1 Left,Plantar Foot o Silvercel Non-Adherent Wound #2 Right,Dorsal Foot o Silvercel Non-Adherent Secondary Dressing Wound #1 Left,Plantar Foot o Other - offloading felt Wound #2 Right,Dorsal Foot o Gauze and Kerlix/Conform Dressing Change Frequency Wound #1 Left,Plantar Foot o Change dressing every other day. Wound #2 Right,Dorsal Foot o Change dressing every other day. Follow-up Appointments Wound #1 Left,Plantar Foot o Return Appointment in 1 week. Wound #2 Right,Dorsal Foot o Return Appointment in 1 week. Consults JAYMIE, MCKIDDY ([id number]) o [facility] o X-ray, foot right - right foot o X-ray, foot left - left foot Electronic Signature(s) Signed: 12/12/2016 7:49:03 AM By: Roger Shelter Signed: 12/12/2016 10:20:41 AM  By: Christin Fudge MD, FACS Previous Signature: 12/11/2016 4:30:10 PM Version By: Christin Fudge MD, FACS Entered By: Roger Shelter on 12/11/2016 17:20:34 Cynthia Terry ([id number]) -------------------------------------------------------------------------------- Problem List Details Patient Name: Cynthia Terry Date of Service: 12/11/2016 12:30 PM Medical Record Number: [id number] Patient Account Number: [id number] Date of Birth/Sex: Feb 26, 1924 (83 y.o. 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/11/2016 Yes exposed M10.072 Idiopathic gout, left ankle and foot 12/11/2016 Yes M10.071 Idiopathic gout, right ankle and foot 12/11/2016 Yes Inactive Problems Resolved Problems Electronic  Signature(s) Signed: 12/11/2016 2:15:19 PM By: Christin Fudge MD, FACS Entered By: Christin Fudge on 12/11/2016 14:15:19 Cynthia Terry ([id number]) -------------------------------------------------------------------------------- Progress Note Details Patient Name: Cynthia Terry Date of Service: 12/11/2016 12:30 PM Medical Record Number: [id number] Patient Account Number: [id number] Date of Birth/Sex: 09-25-1924 (83 y.o. Female) Treating RN: Cornell Barman Primary Care Provider: Johny Drilling Other Clinician: Referring Provider: Johny Drilling Treating Provider/Extender: Frann Rider in Treatment: 0 Subjective Chief Complaint Information obtained from Patient Patient seen for complaints of Non-Healing Wound to the left plantar metatarsal region and the right dorsum of her third toe which she's had for about a month History of Present Illness (HPI) The following HPI elements were documented for the patient's wound: Location: plantar aspect of the left first metatarsal head and the dorsum of the right third toe Quality: Patient reports No Pain. Severity: Patient states wound are getting worse. Duration: Patient has had the wound for < 4 weeks prior to presenting for treatment Context: The wound appeared gradually over time Modifying Factors: Other treatment(s) tried include:local care with Neosporin and hydrogen peroxide Associated Signs and Symptoms: Patient reports having:seen a podiatrist over a year or 2 ago and they said they could not do much for her feet 83 year old female referred by her PCP Dr. Johny Drilling, for a ulcer on the left foot which has been there for about a month. They noted a progressive increase in size of the ulceration had not been treating it with peroxide and Neosporin. She also has polymyalgia rheumatica and is on prednisone chronically. She has never been a smoker. Review of her electronic medical records does not reveal any recent x-ray of her foot nor is  there any evidence of arterial studies done. the patient has seen podiatry about 2 years ago and at that time they did not recommend anything specific for her feet and said there was no solution to the problems due to her deformity with gout Wound History Patient presents with 3 open wounds that have been present for approximately 3 to 4 weeks. Patient has been treating wounds in the following manner: cleaning, peroxide, neosporin and dressing. Laboratory tests have not been performed in the last month. Patient reportedly has not tested positive for an antibiotic resistant organism. Patient reportedly has not tested positive for osteomyelitis. Patient reportedly has not had testing performed to evaluate circulation in the legs. Patient History Information obtained from Patient. Allergies No Known Drug Allergies Family History Cancer - Father, Heart Disease - Mother, Hypertension - Mother,Siblings, No family history of Diabetes, Kidney Disease, Lung Disease, Seizures, Stroke, Thyroid Problems, Tuberculosis. Social History Never smoker, Marital Status - Widowed, Alcohol  Use - Never, Drug Use - No History, Caffeine Use - Rarely. Medical History Cynthia Terry, Cynthia Terry ([id number]) Eyes Patient has history of Cataracts Denies history of Glaucoma, Optic Neuritis Ear/Nose/Mouth/Throat Denies history of Middle ear problems Hematologic/Lymphatic Denies history of Anemia, Hemophilia, Human Immunodeficiency Virus, Lymphedema, Sickle Cell Disease Respiratory Patient has history of Chronic Obstructive Pulmonary Disease (COPD) Denies history of Aspiration, Asthma, Pneumothorax, Sleep Apnea, Tuberculosis Cardiovascular Patient has history of Congestive Heart Failure, Hypertension Denies history of Angina, Arrhythmia, Coronary Artery Disease, Deep Vein Thrombosis, Myocardial Infarction, Peripheral Arterial Disease, Peripheral Venous Disease, Phlebitis, Vasculitis Gastrointestinal Denies history of  Cirrhosis , Colitis, Crohn s, Hepatitis A, Hepatitis B, Hepatitis C Endocrine Denies history of Type I Diabetes, Type II Diabetes Genitourinary Denies history of End Stage Renal Disease Immunological Denies history of Lupus Erythematosus, Raynaud s, Scleroderma Integumentary (Skin) Denies history of History of Burn, History of pressure wounds Musculoskeletal Patient has history of Gout, Rheumatoid Arthritis Denies history of Osteoarthritis, Osteomyelitis Neurologic Denies history of Dementia, Neuropathy, Quadriplegia, Paraplegia, Seizure Disorder Oncologic Denies history of Received Chemotherapy, Received Radiation Psychiatric Denies history of Anorexia/bulimia, Confinement Anxiety Review of Systems (ROS) Constitutional Symptoms (General Health) Denies complaints or symptoms of Fatigue, Fever, Chills, Marked Weight Change. Eyes Denies complaints or symptoms of Dry Eyes, Vision Changes, Glasses / Contacts. Ear/Nose/Mouth/Throat Denies complaints or symptoms of Difficult clearing ears, Sinusitis. Hematologic/Lymphatic Denies complaints or symptoms of Bleeding / Clotting Disorders, Human Immunodeficiency Virus. Respiratory Complains or has symptoms of Chronic or frequent coughs - with exertion, Shortness of Breath - with exertion. Cardiovascular Complains or has symptoms of LE edema. Gastrointestinal Denies complaints or symptoms of Frequent diarrhea, Nausea, Vomiting. Endocrine Denies complaints or symptoms of Hepatitis, Thyroid disease, Polydypsia (Excessive Thirst). Genitourinary Complains or has symptoms of Incontinence/dribbling. Denies complaints or symptoms of Kidney failure/ Dialysis. Immunological Denies complaints or symptoms of Hives, Itching. Integumentary (Skin) Complains or has symptoms of Wounds, Bleeding or bruising tendency, Breakdown, Swelling. Musculoskeletal Denies complaints or symptoms of Muscle Pain, Muscle Weakness. Cynthia Terry, Cynthia Terry  ([id number]) Neurologic Denies complaints or symptoms of Numbness/parasthesias, Focal/Weakness. Oncologic The patient has no complaints or symptoms. Psychiatric Denies complaints or symptoms of Anxiety, Claustrophobia. Medications spironolactone 1 1 tablet daily Atrovent HFA 17 mcg/actuation aerosol inhaler inhalation 2 2 HFA aerosol inhaler inhalations four times daily metoprolol tartrate 100 mg tablet oral one and one half tablets (150 mg.) oral two times daily coenzyme Q10 100 mg tablet oral 1 1 tablet oral daily Xarelto 15 mg tablet oral 1 1 tablet oral daily allopurinol 100 mg tablet oral 1 1 tablet oral daily montelukast 10 mg tablet oral 1 1 tablet oral daily Fish Oil 1,000 mg (120 mg-180 mg) capsule oral 1 1 capsule oral daily furosemide 20 mg tablet oral 1 1 tablet oral daily omeprazole 20 mg tablet,delayed release oral 1 1 tablet,delayed release (DR/EC) oral daily Celexa 10 mg tablet oral 1 1 tablet oral daily Vitamin D2 50,000 unit capsule oral 1 1 capsule oral weekly Objective Constitutional Pulse regular. Respirations normal and unlabored. Afebrile. Vitals Time Taken: 12:47 PM, Height: 63 in, Source: Stated, Weight: 202 lbs, Source: Stated, BMI: 35.8, Temperature: 98.2  F, Pulse: 84 bpm, Respiratory Rate: 18 breaths/min, Blood Pressure: 142/78 mmHg. Eyes Nonicteric. Reactive to light. Ears, Nose, Mouth, and Throat Lips, teeth, and gums WNL.Marland Kitchen Moist mucosa without lesions. Neck supple and nontender. No palpable supraclavicular or cervical adenopathy. Normal sized without goiter. Respiratory WNL. No retractions.. Cardiovascular Pedal Pulses WNL. ABI on the left is 1.0 on the  right is 0.81. No clubbing, cyanosis or edema. Chest Breasts symmetical and no nipple discharge.. Breast tissue WNL, no masses, lumps, or tenderness.. Gastrointestinal (GI) Cynthia Terry, Cynthia S. ([id number]) Abdomen without masses or tenderness.. No liver or spleen enlargement or  tenderness.. Lymphatic No adneopathy. No adenopathy. No adenopathy. Musculoskeletal Adexa without tenderness or enlargement.. Digits and nails w/o clubbing, cyanosis, infection, petechiae, ischemia, or inflammatory conditions.Marland Kitchen Psychiatric Judgement and insight Intact.. No evidence of depression, anxiety, or agitation.. 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 On the plantar aspect of her first metatarsal head she has a ulcerated area with a lot of surrounding callus which was sharply debrided with tooth forceps and scissors. She has a fairly superficial ulceration on the dorsum of her right toe in the region of the interphalangeal joints, again possibly due to the deformity of her toes rubbing against the footwear Integumentary (Hair, Skin) No suspicious lesions. No crepitus or fluctuance. No peri-wound warmth or erythema. No masses.. Wound #1 status is Open. Original cause of wound was Gradually Appeared. The wound is located on the [place]. The wound measures 1cm length x 1.2cm width x 0.2cm depth; 0.942cm^2 area and 0.188cm^3 volume. There is Fat Layer (Subcutaneous Tissue) Exposed exposed. There is a medium amount of serous drainage noted. The wound margin is flat and intact. There is medium (34-66%) pink granulation within the wound bed. There is a medium (34-66%) amount of necrotic tissue within the wound bed including Adherent Slough. The periwound skin appearance exhibited: Callus, Maceration. The periwound skin appearance did not exhibit: Crepitus, Excoriation, Induration, Rash, Scarring, Dry/Scaly, Atrophie Blanche, Cyanosis, Ecchymosis, Hemosiderin Staining, Mottled, Pallor, Rubor, Erythema. Periwound temperature was noted as No Abnormality. Wound #2 status is Open. Original cause of wound was Gradually Appeared. The wound is located on the Right,Dorsal Foot. The wound measures 0.3cm  length x 0.5cm width x 0.2cm depth; 0.118cm^2 area and 0.024cm^3 volume. There is Fat Layer (Subcutaneous Tissue) Exposed exposed. There is no tunneling or undermining noted. There is a medium amount of serosanguineous drainage noted. The wound margin is flat and intact. There is no granulation within the wound bed. There is a large (67-100%) amount of necrotic tissue within the wound bed. The periwound skin appearance exhibited: Excoriation. The periwound skin appearance did not exhibit: Callus, Crepitus, Induration, Rash, Scarring, Dry/Scaly, Maceration, Atrophie Blanche, Cyanosis, Ecchymosis, Hemosiderin Staining, Mottled, Pallor, Rubor, Erythema. 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 After review today and some sharp debridement on the left plantar aspect of her foot I have recommended: Cynthia Terry, Cynthia Terry. ([id number]) 1. Silver alginate and an offloading felt on the plantar aspect of the left foot and also some silver alginate on the dorsum of the right third toe. 2. x-ray of both feet 3. Offloading has been discussed with her in great detail 4. Adequate protein, Vitamin A, vitamin C and zinc. 5. I have asked her to see our podiatry group again to reevaluate regarding options of treating her deformity and also the possibility of getting  her orthotic shoes made 6. regular visits to the wound center Procedures Wound #1 Pre-procedure diagnosis of Wound #1 is a Pressure Ulcer located on the Left,Plantar Foot . There was a Skin/Subcutaneous Tissue  Debridement ([id number]) debridement with total area of 1.2 sq cm performed by Christin Fudge, MD. with the following instrument(s): Forceps and Scissors to remove Viable and Non-Viable tissue/material including Fibrin/Slough, Callus, and Subcutaneous after achieving pain control using Other (lidocaine 4%). A time out was conducted at 13:55, prior to the start of the procedure. A Minimum amount of bleeding was controlled with Pressure. The procedure was tolerated well with a pain level of 0 throughout and a pain level of 0 following the procedure. Post Debridement Measurements: 1cm length x 1.2cm width x 0.3cm depth; 0.283cm^3 volume. Post debridement Stage noted as Category/Stage II. Character of Wound/Ulcer Post Debridement is stable. Post procedure Diagnosis Wound #1: Same as Pre-Procedure Plan Wound Cleansing: Wound #1 Left,Plantar Foot: Clean wound with Normal Saline. Wound #2 Right,Dorsal Foot: Clean wound with Normal Saline. Anesthetic: Wound #1 Left,Plantar Foot: Topical Lidocaine 4% cream applied to wound bed prior to debridement Wound #2 Right,Dorsal Foot: Topical Lidocaine 4% cream applied to wound bed prior to debridement Primary Wound Dressing: Wound #1 Left,Plantar Foot: Silvercel Non-Adherent Wound #2 Right,Dorsal Foot: Silvercel Non-Adherent Secondary Dressing: Wound #1 Left,Plantar Foot: Other - offloading felt Wound #2 Right,Dorsal Foot: Gauze and Kerlix/Conform Dressing Change Frequency: Wound #1 Left,Plantar Foot: Change dressing every other day. Wound #2 Right,Dorsal Foot: Change dressing every other day. Follow-up Appointments: Wound #1 Left,Plantar Foot: Return Appointment in 1 week. Wound #2 Right,Dorsal Foot: Cynthia Terry, Cynthia Terry. ([id number]) Return Appointment in 1 week. 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 After review today and some sharp debridement on the left plantar aspect of her foot I have recommended: 1. Silver alginate and an offloading felt on the plantar aspect of the left foot and also some silver alginate on the dorsum of the right third toe. 2. x-ray of both feet 3. Offloading has been discussed with her in great detail 4. Adequate protein, Vitamin A, vitamin C and zinc. 5. I have asked her to see our podiatry group again to reevaluate regarding options of treating her deformity and also the possibility of getting her orthotic shoes made 6. regular visits to the wound center Electronic Signature(s) Signed: 12/12/2016 10:20:01 AM By: Christin Fudge MD, FACS Previous Signature: 12/11/2016 2:22:01 PM Version By: Christin Fudge MD, FACS Entered By: Christin Fudge on 12/12/2016 10:20:01 Cynthia Terry ([id number]) -------------------------------------------------------------------------------- ROS/PFSH Details Patient Name: Cynthia Terry Date of Service: 12/11/2016 12:30 PM Medical Record Number: [id number] Patient Account Number: [id number] Date of Birth/Sex: 1924-07-24 (83 y.o. Female) Treating RN: Roger Shelter Primary Care Provider: Johny Drilling Other Clinician: Referring Provider: Johny Drilling Treating Provider/Extender: Frann Rider in Treatment: 0 Information Obtained From Patient Wound History Do you currently have one or more open woundso Yes How many open wounds do you currently haveo 3 Approximately how long have you had your woundso 3 to 4 weeks How have you been treating your wound(s) until nowo cleaning, peroxide, neosporin and dressing Has your wound(s) ever healed and then re-openedo No Have you had any lab work done in the past montho No Have  you tested positive for an antibiotic resistant organism (MRSA,  No VRE)o Have you tested positive for osteomyelitis (bone infection)o No Have you had any tests for circulation on your legso No Constitutional Symptoms (General Health) Complaints and Symptoms: Negative for: Fatigue; Fever; Chills; Marked Weight Change Eyes Complaints and Symptoms: Negative for: Dry Eyes; Vision Changes; Glasses / Contacts Medical History: Positive for: Cataracts Negative for: Glaucoma; Optic Neuritis Ear/Nose/Mouth/Throat Complaints and Symptoms: Negative for: Difficult clearing ears; Sinusitis Medical History: Negative for: Middle ear problems Hematologic/Lymphatic Complaints and Symptoms: Negative for: Bleeding / Clotting Disorders; Human Immunodeficiency Virus Medical History: Negative for: Anemia; Hemophilia; Human Immunodeficiency Virus; Lymphedema; Sickle Cell Disease Respiratory Complaints and Symptoms: Positive for: Chronic or frequent coughs - with exertion; Shortness of Breath - with exertion Cynthia Terry, Cynthia S. ([id number]) Medical History: Positive for: Chronic Obstructive Pulmonary Disease (COPD) Negative for: Aspiration; Asthma; Pneumothorax; Sleep Apnea; Tuberculosis Cardiovascular Complaints and Symptoms: Positive for: LE edema Medical History: Positive for: Congestive Heart Failure; Hypertension Negative for: Angina; Arrhythmia; Coronary Artery Disease; Deep Vein Thrombosis; Myocardial Infarction; Peripheral Arterial Disease; Peripheral Venous Disease; Phlebitis; Vasculitis Gastrointestinal Complaints and Symptoms: Negative for: Frequent diarrhea; Nausea; Vomiting Medical History: Negative for: Cirrhosis ; Colitis; Crohnos; Hepatitis A; Hepatitis B; Hepatitis C Endocrine Complaints and Symptoms: Negative for: Hepatitis; Thyroid disease; Polydypsia (Excessive Thirst) Medical History: Negative for: Type I Diabetes; Type II Diabetes Genitourinary Complaints and Symptoms: Positive for: Incontinence/dribbling Negative for: Kidney failure/  Dialysis Medical History: Negative for: End Stage Renal Disease Immunological Complaints and Symptoms: Negative for: Hives; Itching Medical History: Negative for: Lupus Erythematosus; Raynaudos; Scleroderma Integumentary (Skin) Complaints and Symptoms: Positive for: Wounds; Bleeding or bruising tendency; Breakdown; Swelling Medical History: Negative for: History of Burn; History of pressure wounds Musculoskeletal Complaints and Symptoms: Negative for: Muscle Pain; Muscle Weakness Cynthia Terry, Cynthia S. ([id number]) Medical History: Positive for: Gout; Rheumatoid Arthritis Negative for: Osteoarthritis; Osteomyelitis Neurologic Complaints and Symptoms: Negative for: Numbness/parasthesias; Focal/Weakness Medical History: Negative for: Dementia; Neuropathy; Quadriplegia; Paraplegia; Seizure Disorder Psychiatric Complaints and Symptoms: Negative for: Anxiety; Claustrophobia Medical History: Negative for: Anorexia/bulimia; Confinement Anxiety Oncologic Complaints and Symptoms: No Complaints or Symptoms Medical History: Negative for: Received Chemotherapy; Received Radiation HBO Extended History Items Eyes: Cataracts Immunizations Pneumococcal Vaccine: Received Pneumococcal Vaccination: Yes Tetanus Vaccine: Last tetanus shot: 12/12/2014 Implantable Devices Family and Social History Cancer: Yes - Father; Diabetes: No; Heart Disease: Yes - Mother; Hypertension: Yes - Mother,Siblings; Kidney Disease: No; Lung Disease: No; Seizures: No; Stroke: No; Thyroid Problems: No; Tuberculosis: No; Never smoker; Marital Status - Widowed; Alcohol Use: Never; Drug Use: No History; Caffeine Use: Rarely; Financial Concerns: No; Food, Clothing or Shelter Needs: No; Support System Lacking: No; Transportation Concerns: No; Advanced Directives: Yes (Not Provided); Patient does not want information on Advanced Directives; Do not resuscitate: No; Living Will: Yes (Not Provided) Physician Affirmation I  have reviewed and agree with the above information. Electronic Signature(s) Signed: 12/11/2016 4:30:10 PM By: Christin Fudge MD, FACS Signed: 12/12/2016 7:49:03 AM By: Roger Shelter Entered By: Christin Fudge on 12/11/2016 14:13:17 Cynthia Terry ([id number]) -------------------------------------------------------------------------------- SuperBill Details Patient Name: Cynthia Terry Date of Service: 12/11/2016 Medical Record Number: [id number] Patient Account Number: [id number] Date of Birth/Sex: 1924-01-31 (83 y.o. 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, left ankle and foot M10.071 Idiopathic gout, right ankle and foot Facility Procedures CPT4 Code Description: [id number] 99213 - WOUND CARE VISIT-LEV 3 EST PT Modifier: Quantity: 1 CPT4 Code Description: [id number] 11042 - DEB SUBQ TISSUE 20 SQ CM/< ICD-10 Diagnosis Description L97.521 Non-pressure chronic ulcer of other part of left foot limited t M10.071 Idiopathic gout, right ankle and foot L97.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, left ankle and foot Modifier: o breakdown of s layer exposed Quantity: 1 kin Physician Procedures CPT4 Code Description: [id number] 75170 - WC PHYS LEVEL 4 - NEW PT ICD-10 Diagnosis Description L97.521 Non-pressure chronic ulcer of other part of left foot limited t L97.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 M10.072  Idiopathic gout, left ankle and foot M10.071 Idiopathic gout, right ankle and foot Modifier: 25 o breakdown of sk layer  exposed Quantity: 1 in CPT4 Code Description: [id number] 96759 - WC PHYS SUBQ TISS 20 SQ CM ICD-10 Diagnosis Description L97.521 Non-pressure chronic ulcer of other part of left foot limited t M10.071 Idiopathic gout, right ankle and foot L97.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

## 2016-12-12 NOTE — Progress Notes (Signed)
DAWNETTA, COPENHAVER ([id number]) Visit Report for 12/11/2016 Abuse/Suicide Risk Screen Details Patient Name: Cynthia Terry, Cynthia Terry Date of Service: 12/11/2016 12:30 PM Medical Record Number: [id number] Patient Account Number: [id number] Date of Birth/Sex: 11/28/1924 (83 y.o. Female) Treating RN: Roger Shelter Primary Care Ashford Clouse: Johny Drilling Other Clinician: Referring Miaa Latterell: Johny Drilling Treating Robb Sibal/Extender: Frann Rider in Treatment: 0 Abuse/Suicide Risk Screen Items Answer ABUSE/SUICIDE RISK SCREEN: Has anyone close to you tried to hurt or harm you recentlyo No Do you feel uncomfortable with anyone in your familyo No Has anyone forced you do things that you didnot want to doo No Do you have any thoughts of harming yourselfo No Patient displays signs or symptoms of abuse and/or neglect. No Electronic Signature(s) Signed: 12/12/2016 7:49:03 AM By: Roger Shelter Entered By: Roger Shelter on 12/11/2016 13:03:52 Cynthia Terry ([id number]) -------------------------------------------------------------------------------- Activities of Daily Living Details Patient Name: Cynthia Terry, Cynthia Terry Date of Service: 12/11/2016 12:30 PM Medical Record Number: [id number] Patient Account Number: [id number] Date of Birth/Sex: 03-Jan-1925 (83 y.o. Female) Treating RN: Roger Shelter Primary Care Ruqayyah Lute: Johny Drilling Other Clinician: Referring Marly Schuld: Johny Drilling Treating Donyale Falcon/Extender: Frann Rider in Treatment: 0 Activities of Daily Living Items Answer Activities of Daily Living (Please select one for each item) Drive Automobile Need Assistance Take Medications Need Assistance Use Telephone Completely Able Care for Appearance Need Assistance Use Toilet Need Assistance Bath / Shower Completely Able Dress Self Completely Able Feed Self Completely Able Walk Need Assistance Get In / Out Bed Need Assistance Housework Need Assistance Prepare Meals Not  Able Handle Money Need Assistance Shop for Self Need Assistance Electronic Signature(s) Signed: 12/12/2016 7:49:03 AM By: Roger Shelter Entered By: Roger Shelter on 12/11/2016 13:05:18 Cynthia Terry ([id number]) -------------------------------------------------------------------------------- Education Assessment Details Patient Name: Cynthia Terry Date of Service: 12/11/2016 12:30 PM Medical Record Number: [id number] Patient Account Number: [id number] Date of Birth/Sex: May 11, 1924 (83 y.o. Female) Treating RN: Roger Shelter Primary Care Gillie Crisci: Johny Drilling Other Clinician: Referring Jodeen Mclin: Johny Drilling Treating Koden Hunzeker/Extender: Frann Rider in Treatment: 0 Primary Learner Assessed: Patient Learning Preferences/Education Level/Primary Language Learning Preference: Explanation Highest Education Level: High School Preferred Language: English Cognitive Barrier Assessment/Beliefs Language Barrier: No Translator Needed: No Memory Deficit: No Emotional Barrier: No Cultural/Religious Beliefs Affecting Medical Care: No Physical Barrier Assessment Impaired Vision: No Impaired Hearing: No Decreased Hand dexterity: No Knowledge/Comprehension Assessment Knowledge Level: High Comprehension Level: High Ability to understand written High instructions: Ability to understand verbal High instructions: Motivation Assessment Anxiety Level: Calm Cooperation: Cooperative Education Importance: Acknowledges Need Interest in Health Problems: Asks Questions Perception: Coherent Willingness to Engage in Self- High Management Activities: Readiness to Engage in Self- High Management Activities: Electronic Signature(s) Signed: 12/12/2016 7:49:03 AM By: Roger Shelter Entered By: Roger Shelter on 12/11/2016 13:05:58 Cynthia Terry ([id number]) -------------------------------------------------------------------------------- Fall Risk Assessment  Details Patient Name: Cynthia Terry Date of Service: 12/11/2016 12:30 PM Medical Record Number: [id number] Patient Account Number: [id number] Date of Birth/Sex: Feb 08, 1924 (83 y.o. Female) Treating RN: Roger Shelter Primary Care Kendric Sindelar: Johny Drilling Other Clinician: Referring Debbera Wolken: Johny Drilling Treating Islah Eve/Extender: Frann Rider in Treatment: 0 Fall Risk Assessment Items Have you had 2 or more falls in the last 12 monthso 0 Yes Have you had any fall that resulted in injury in the last 12 monthso 0 No FALL RISK ASSESSMENT: History of falling - immediate or within 3 months 0 No Secondary diagnosis 0 No Ambulatory aid None/bed rest/wheelchair/nurse 0 Yes Crutches/cane/walker 15 Yes Furniture 0 No IV Access/Saline Lock 0  No Gait/Training Normal/bed rest/immobile 0 No Weak 10 Yes Impaired 0 No Mental Status Oriented to own ability 0 Yes Electronic Signature(s) Signed: 12/12/2016 7:49:03 AM By: Roger Shelter Entered By: Roger Shelter on 12/11/2016 13:06:59 Cynthia Terry ([id number]) -------------------------------------------------------------------------------- Foot Assessment Details Patient Name: Cynthia Terry Date of Service: 12/11/2016 12:30 PM Medical Record Number: [id number] Patient Account Number: [id number] Date of Birth/Sex: 11-12-1924 (83 y.o. Female) Treating RN: Roger Shelter Primary Care Glorious Flicker: Johny Drilling Other Clinician: Referring Terea Neubauer: Johny Drilling Treating Skyann Ganim/Extender: Frann Rider in Treatment: 0 Foot Assessment Items Site Locations + = Sensation present, - = Sensation absent, C = Callus, U = Ulcer R = Redness, W = Warmth, M = Maceration, PU = Pre-ulcerative lesion F = Fissure, S = Swelling, D = Dryness Assessment Right: Left: Other Deformity: Yes Yes Prior Foot Ulcer: Yes Yes Prior Amputation: No No Charcot Joint: No No Ambulatory Status: Ambulatory With Help Assistance Device: Walker Gait:  Administrator, arts) Signed: 12/12/2016 7:49:03 AM By: Roger Shelter Entered By: Roger Shelter on 12/11/2016 13:12:52 Cynthia Terry ([id number]) -------------------------------------------------------------------------------- Nutrition Risk Assessment Details Patient Name: Cynthia Terry Date of Service: 12/11/2016 12:30 PM Medical Record Number: [id number] Patient Account Number: [id number] Date of Birth/Sex: 07-06-1924 (83 y.o. Female) Treating RN: Roger Shelter Primary Care Brayden Brodhead: Johny Drilling Other Clinician: Referring Jin Capote: Johny Drilling Treating Cabrini Ruggieri/Extender: Frann Rider in Treatment: 0 Height (in): 63 Weight (lbs): 202 Body Mass Index (BMI): 35.8 Nutrition Risk Assessment Items NUTRITION RISK SCREEN: I have an illness or condition that made me change the kind and/or amount of 0 No food I eat I eat fewer than two meals per day 0 No I eat few fruits and vegetables, or milk products 0 No I have three or more drinks of beer, liquor or wine almost every day 0 No I have tooth or mouth problems that make it hard for me to eat 0 No I don't always have enough money to buy the food I need 0 No I eat alone most of the time 0 No I take three or more different prescribed or over-the-counter drugs a day 0 No Without wanting to, I have lost or gained 10 pounds in the last six months 0 No I am not always physically able to shop, cook and/or feed myself 0 No Nutrition Protocols Good Risk Protocol 0 No interventions needed Moderate Risk Protocol Electronic Signature(s) Signed: 12/12/2016 7:49:03 AM By: Roger Shelter Entered By: Roger Shelter on 12/11/2016 13:07:50

## 2016-12-14 NOTE — Progress Notes (Signed)
INAYA, GILLHAM ([id number]) Visit Report for 12/11/2016 Allergy List Details Patient Name: Cynthia Terry, Cynthia Terry Date of Service: 12/11/2016 12:30 PM Medical Record Number: [id number] Patient Account Number: [id number] Date of Birth/Sex: August 24, 1924 (83 y.o. Female) Treating RN: Roger Shelter Primary Care Manraj Yeo: Johny Drilling Other Clinician: Referring Derra Shartzer: Johny Drilling Treating Kuper Rennels/Extender: Frann Rider in Treatment: 0 Allergies Active Allergies No Known Drug Allergies Allergy Notes Electronic Signature(s) Signed: 12/12/2016 7:49:03 AM By: Roger Shelter Entered By: Roger Shelter on 12/11/2016 12:49:54 Cynthia Terry ([id number]) -------------------------------------------------------------------------------- Arrival Information Details Patient Name: Cynthia Terry Date of Service: 12/11/2016 12:30 PM Medical Record Number: [id number] Patient Account Number: [id number] Date of Birth/Sex: [birth year] (83 y.o. Female) Treating RN: Roger Shelter Primary Care Senora Lacson: Johny Drilling Other Clinician: Referring Leah Thornberry: Johny Drilling Treating Jordyn Hofacker/Extender: Frann Rider in Treatment: 0 Visit Information Patient Arrived: Wheel Chair Arrival Time: 12:46 Accompanied By: son and daughter Transfer Assistance: None Patient Identification Verified: Yes Secondary Verification Process Completed: Yes Patient Requires Transmission-Based No Precautions: Patient Has Alerts: No Electronic Signature(s) Signed: 12/12/2016 7:49:03 AM By: Roger Shelter Entered By: Roger Shelter on 12/11/2016 12:46:42 Cynthia Terry ([id number]) -------------------------------------------------------------------------------- Clinic Level of Care Assessment Details Patient Name: Cynthia Terry Date of Service: 12/11/2016 12:30 PM Medical Record Number: [id number] Patient Account Number: [id number] Date of Birth/Sex: May 06, 1924 (83 y.o. Female) Treating RN: Roger Shelter Primary Care Chamille Werntz: Johny Drilling Other Clinician: Referring Amias Hutchinson: Johny Drilling Treating Vinny Taranto/Extender: Frann Rider in Treatment: 0 Clinic Level of Care Assessment Items TOOL 1 Quantity Score []  - Use when EandM and Procedure is performed on INITIAL visit 0 ASSESSMENTS - Nursing Assessment / Reassessment X - General Physical Exam (combine w/ comprehensive assessment (listed just below) when 1 20 performed on new pt. evals) X- 1 25 Comprehensive Assessment (HX, ROS, Risk Assessments, Wounds Hx, etc.) ASSESSMENTS - Wound and Skin Assessment / Reassessment []  - Dermatologic / Skin Assessment (not related to wound area) 0 ASSESSMENTS - Ostomy and/or Continence Assessment and Care []  - Incontinence Assessment and Management 0 []  - 0 Ostomy Care Assessment and Management (repouching, etc.) PROCESS - Coordination of Care X - Simple Patient / Family Education for ongoing care 1 15 []  - 0 Complex (extensive) Patient / Family Education for ongoing care X- 1 10 Staff obtains Programmer, systems, Records, Test Results / Process Orders []  - 0 Staff telephones HHA, Nursing Homes / Clarify orders / etc []  - 0 Routine Transfer to another Facility (non-emergent condition) []  - 0 Routine Hospital Admission (non-emergent condition) X- 1 15 New Admissions / Biomedical engineer / Ordering NPWT, Apligraf, etc. []  - 0 Emergency Hospital Admission (emergent condition) PROCESS - Special Needs []  - Pediatric / Minor Patient Management 0 []  - 0 Isolation Patient Management []  - 0 Hearing / Language / Visual special needs []  - 0 Assessment of Community assistance (transportation, D/C planning, etc.) []  - 0 Additional assistance / Altered mentation []  - 0 Support Surface(s) Assessment (bed, cushion, seat, etc.) Fluharty, Dail S. ([id number]) INTERVENTIONS - Miscellaneous []  - External ear exam 0 []  - 0 Patient Transfer (multiple staff / Civil Service fast streamer / Similar devices) []  -  0 Simple Staple / Suture removal (25 or less) []  - 0 Complex Staple / Suture removal (26 or more) []  - 0 Hypo/Hyperglycemic Management (do not check if billed separately) X- 1 15 Ankle / Brachial Index (ABI) - do not check if billed separately Has the patient been seen at the hospital within the last three years: Yes Total  Score: 100 Level Of Care: New/Established - Level 3 Electronic Signature(s) Signed: 12/12/2016 7:49:03 AM By: Roger Shelter Entered By: Roger Shelter on 12/11/2016 13:55:10 Cynthia Terry ([id number]) -------------------------------------------------------------------------------- Encounter Discharge Information Details Patient Name: Cynthia Terry Date of Service: 12/11/2016 12:30 PM Medical Record Number: [id number] Patient Account Number: [id number] Date of Birth/Sex: 1924/07/07 (83 y.o. Female) Treating RN: Cornell Barman Primary Care Lutie Pickler: Johny Drilling Other Clinician: Referring Ryanne Morand: Johny Drilling Treating Aliviana Burdell/Extender: Frann Rider in Treatment: 0 Encounter Discharge Information Items Schedule Follow-up Appointment: No Medication Reconciliation completed and No provided to Patient/Care Darra Rosa: Provided on Clinical Summary of Care: 12/11/2016 Form Type Recipient Paper Patient BI Electronic Signature(s) Signed: 12/13/2016 11:35:39 AM By: Ruthine Dose Entered By: Ruthine Dose on 12/11/2016 14:17:52 Cynthia Terry ([id number]) -------------------------------------------------------------------------------- Lower Extremity Assessment Details Patient Name: Cynthia Terry Date of Service: 12/11/2016 12:30 PM Medical Record Number: [id number] Patient Account Number: [id number] Date of Birth/Sex: 07-03-1924 (83 y.o. Female) Treating RN: Roger Shelter Primary Care Nuno Brubacher: Johny Drilling Other Clinician: Referring Tremont Gavitt: Johny Drilling Treating [place] Lesure/Extender: Frann Rider in Treatment: 0 Edema  Assessment Assessed: Cynthia Terry: No] [Right: No] Edema: [Left: No] [Right: No] Calf Left: Right: Point of Measurement: 30 cm From Medial Instep 43 cm 43 cm Ankle Left: Right: Point of Measurement: 10 cm From Medial Instep 26 cm 25.6 cm Vascular Assessment Claudication: Claudication Assessment [Left:None] [Right:None] Pulses: Dorsalis Pedis Palpable: [Left:Yes] [Right:Yes] Doppler Audible: [Left:Yes] [Right:Yes] Posterior Tibial Palpable: [Left:No] [Right:Yes] Doppler Audible: [Left:Yes] [Right:Yes] Extremity colors, hair growth, and conditions: Extremity Color: [Left:Normal] [Right:Normal] Hair Growth on Extremity: [Left:No] [Right:No] Temperature of Extremity: [Left:Cool] [Right:Cool] Capillary Refill: [Left:> 3 seconds] [Right:> 3 seconds] Blood Pressure: Brachial: [Left:160] [Right:130] Dorsalis Pedis: 160 [Left:Dorsalis Pedis: 70] Ankle: Posterior Tibial: [Left:Posterior Tibial: 130 1.00] [Right:0.81] Toe Nail Assessment Left: Right: Thick: No No Discolored: No No Deformed: No No Improper Length and Hygiene: No No Electronic Signature(s) Signed: 12/12/2016 7:49:03 AM By: Marijean Bravo ([id number]) Entered By: Roger Shelter on 12/11/2016 13:27:31 Cynthia Terry ([id number]) -------------------------------------------------------------------------------- Multi Wound Chart Details Patient Name: Cynthia Terry Date of Service: 12/11/2016 12:30 PM Medical Record Number: [id number] Patient Account Number: [id number] Date of Birth/Sex: 1924-11-30 (83 y.o. Female) Treating RN: Roger Shelter Primary Care Shean Gerding: Johny Drilling Other Clinician: Referring Corion Sherrod: Johny Drilling Treating Tarris Delbene/Extender: Frann Rider in Treatment: 0 Vital Signs Height(in): 11 Pulse(bpm): 75 Weight(lbs): 202 Blood Pressure(mmHg): 142/78 Body Mass Index(BMI): 36 Temperature(F): 98.2 Respiratory Rate 18 (breaths/min): Photos: [N/A:N/A] Wound  Location: Left Foot - Plantar Right Foot - Dorsal N/A Wounding Event: Gradually Appeared Gradually Appeared N/A Primary Etiology: Pressure Ulcer Pressure Ulcer N/A Comorbid History: Cataracts, Chronic Obstructive Cataracts, Chronic Obstructive N/A Pulmonary Disease (COPD), Pulmonary Disease (COPD), Congestive Heart Failure, Congestive Heart Failure, Hypertension, Gout, Hypertension, Gout, Rheumatoid Arthritis Rheumatoid Arthritis Date Acquired: 11/03/2016 11/03/2016 N/A Weeks of Treatment: 0 0 N/A Wound Status: Open Open N/A Measurements L x W x D 1x1.2x0.2 0.3x0.5x0.2 N/A (cm) Area (cm) : 0.942 0.118 N/A Volume (cm) : 0.188 0.024 N/A % Reduction in Area: 0.00% 0.00% N/A % Reduction in Volume: 0.00% 0.00% N/A Classification: Category/Stage II Category/Stage II N/A Exudate Amount: Medium Medium N/A Exudate Type: Serous Serosanguineous N/A Exudate Color: amber red, brown N/A Wound Margin: Flat and Intact Flat and Intact N/A Granulation Amount: Medium (34-66%) None Present (0%) N/A Granulation Quality: Pink N/A N/A Necrotic Amount: Medium (34-66%) Large (67-100%) N/A Exposed Structures: Fat Layer (Subcutaneous Fat Layer (Subcutaneous N/A Tissue) Exposed: Yes Tissue) Exposed: Yes Fascia: No  Fascia: No Tendon: No Tendon: No Muscle: No Muscle: No Copado, Faylynn S. ([id number]) Joint: No Joint: No Bone: No Bone: No Epithelialization: None None N/A Debridement: Debridement ([id number]) N/A N/A Pre-procedure 13:55 N/A N/A Verification/Time Out Taken: Pain Control: Other N/A N/A Tissue Debrided: Fibrin/Slough, Callus, N/A N/A Subcutaneous Level: Skin/Subcutaneous Tissue N/A N/A Debridement Area (sq cm): 1.2 N/A N/A Instrument: Forceps, Scissors N/A N/A Bleeding: Minimum N/A N/A Hemostasis Achieved: Pressure N/A N/A Procedural Pain: 0 N/A N/A Post Procedural Pain: 0 N/A N/A Debridement Treatment Procedure was tolerated well N/A N/A Response: Post Debridement 1x1.2x0.3 N/A  N/A Measurements L x W x D (cm) Post Debridement Volume: 0.283 N/A N/A (cm) Post Debridement Stage: Category/Stage II N/A N/A Periwound Skin Texture: Callus: Yes Excoriation: Yes N/A Excoriation: No Induration: No Induration: No Callus: No Crepitus: No Crepitus: No Rash: No Rash: No Scarring: No Scarring: No Periwound Skin Moisture: Maceration: Yes Maceration: No N/A Dry/Scaly: No Dry/Scaly: No Periwound Skin Color: Atrophie Blanche: No Atrophie Blanche: No N/A Cyanosis: No Cyanosis: No Ecchymosis: No Ecchymosis: No Erythema: No Erythema: No Hemosiderin Staining: No Hemosiderin Staining: No Mottled: No Mottled: No Pallor: No Pallor: No Rubor: No Rubor: No Temperature: No Abnormality N/A N/A Tenderness on Palpation: No No N/A Wound Preparation: Ulcer Cleansing: Ulcer Cleansing: N/A Rinsed/Irrigated with Saline Rinsed/Irrigated with Saline Topical Anesthetic Applied: Topical Anesthetic Applied: Other: lidocaine 4 % Other: lidocaine 4 % Procedures Performed: Debridement N/A N/A Treatment Notes Electronic Signature(s) Signed: 12/11/2016 2:15:26 PM By: Christin Fudge MD, FACS Entered By: Christin Fudge on 12/11/2016 14:15:26 Cynthia Terry ([id number]) -------------------------------------------------------------------------------- [place] Details Patient Name: Cynthia Terry Date of Service: 12/11/2016 12:30 PM Medical Record Number: [id number] Patient Account Number: [id number] Date of Birth/Sex: 13-Jan-1925 (83 y.o. Female) Treating RN: Roger Shelter Primary Care Teneisha Gignac: Johny Drilling Other Clinician: Referring Ordean Fouts: Johny Drilling Treating Fotini Lemus/Extender: Frann Rider in Treatment: 0 Active Inactive ` Orientation to the Wound Care Program Nursing Diagnoses: Knowledge deficit related to the wound healing center program Goals: Patient/caregiver will verbalize understanding of the [place]  Program Date Initiated: 12/11/2016 Target Resolution Date: 01/01/2017 Goal Status: Active Interventions: Provide education on orientation to the wound center Notes: ` Pressure Nursing Diagnoses: Knowledge deficit related to causes and risk factors for pressure ulcer development Knowledge deficit related to management of pressures ulcers Goals: Patient will remain free from development of additional pressure ulcers Date Initiated: 12/11/2016 Target Resolution Date: 01/02/2017 Goal Status: Active Patient will remain free of pressure ulcers Date Initiated: 12/11/2016 Target Resolution Date: 01/02/2017 Goal Status: Active Patient/caregiver will verbalize risk factors for pressure ulcer development Date Initiated: 12/11/2016 Target Resolution Date: 01/02/2017 Goal Status: Active Patient/caregiver will verbalize understanding of pressure ulcer management Date Initiated: 12/11/2016 Target Resolution Date: 01/02/2017 Goal Status: Active Interventions: Assess: immobility, friction, shearing, incontinence upon admission and as needed Assess potential for pressure ulcer upon admission and as needed Provide education on pressure ulcers Notes: Cynthia Terry, Cynthia Terry ([id number]) ` Wound/Skin Impairment Nursing Diagnoses: Impaired tissue integrity Knowledge deficit related to ulceration/compromised skin integrity Goals: Ulcer/skin breakdown will have a volume reduction of 30% by week 4 Date Initiated: 12/11/2016 Target Resolution Date: 01/02/2017 Goal Status: Active Interventions: Assess patient/caregiver ability to obtain necessary supplies Assess patient/caregiver ability to perform ulcer/skin care regimen upon admission and as needed Assess ulceration(s) every visit Provide education on ulcer and skin care Notes: Electronic Signature(s) Signed: 12/12/2016 7:49:03 AM By: Roger Shelter Entered By: Roger Shelter on 12/11/2016 13:40:06 Cynthia Terry  ([id number]) -------------------------------------------------------------------------------- Pain  Assessment Details Patient Name: Cynthia Terry, Cynthia Terry Date of Service: 12/11/2016 12:30 PM Medical Record Number: [id number] Patient Account Number: [id number] Date of Birth/Sex: 10-Jul-1924 (83 y.o. Female) Treating RN: Roger Shelter Primary Care Jelitza Manninen: Johny Drilling Other Clinician: Referring Arabelle Bollig: Johny Drilling Treating Luisdavid Hamblin/Extender: Frann Rider in Treatment: 0 Active Problems Location of Pain Severity and Description of Pain Patient Has Paino No Site Locations Pain Management and Medication Current Pain Management: Electronic Signature(s) Signed: 12/12/2016 7:49:03 AM By: Roger Shelter Entered By: Roger Shelter on 12/11/2016 12:46:57 Cynthia Terry ([id number]) -------------------------------------------------------------------------------- Wound Assessment Details Patient Name: Cynthia Terry Date of Service: 12/11/2016 12:30 PM Medical Record Number: [id number] Patient Account Number: [id number] Date of Birth/Sex: [birth year] (83 y.o. Female) Treating RN: Roger Shelter Primary Care Staley Budzinski: Johny Drilling Other Clinician: Referring Katrece Roediger: Johny Drilling Treating Yamaira Spinner/Extender: Frann Rider in Treatment: 0 Wound Status Wound Number: 1 Primary Pressure Ulcer Etiology: Wound Location: Left Foot - Plantar Wound Open Wounding Event: Gradually Appeared Status: Date Acquired: 11/03/2016 Comorbid Cataracts, Chronic Obstructive Pulmonary Weeks Of Treatment: 0 History: Disease (COPD), Congestive Heart Failure, Clustered Wound: No Hypertension, Gout, Rheumatoid Arthritis Photos Wound Measurements Length: (cm) 1 Width: (cm) 1.2 Depth: (cm) 0.2 Area: (cm) 0.942 Volume: (cm) 0.188 % Reduction in Area: 0% % Reduction in Volume: 0% Epithelialization: None Wound Description Classification: Category/Stage II Wound Margin: Flat and  Intact Exudate Amount: Medium Exudate Type: Serous Exudate Color: amber Foul Odor After Cleansing: No Slough/Fibrino No Wound Bed Granulation Amount: Medium (34-66%) Exposed Structure Granulation Quality: Pink Fascia Exposed: No Necrotic Amount: Medium (34-66%) Fat Layer (Subcutaneous Tissue) Exposed: Yes Necrotic Quality: Adherent Slough Tendon Exposed: No Muscle Exposed: No Joint Exposed: No Bone Exposed: No Periwound Skin Texture Texture Color No Abnormalities Noted: No No Abnormalities Noted: No Callus: Yes Atrophie Blanche: No Cynthia Terry, PHO. ([id number]) Crepitus: No Cyanosis: No Excoriation: No Ecchymosis: No Induration: No Erythema: No Rash: No Hemosiderin Staining: No Scarring: No Mottled: No Pallor: No Moisture Rubor: No No Abnormalities Noted: No Dry / Scaly: No Temperature / Pain Maceration: Yes Temperature: No Abnormality Wound Preparation Ulcer Cleansing: Rinsed/Irrigated with Saline Topical Anesthetic Applied: Other: lidocaine 4 %, Electronic Signature(s) Signed: 12/12/2016 8:57:55 AM By: Roger Shelter Previous Signature: 12/12/2016 7:49:03 AM Version By: Roger Shelter Entered By: Roger Shelter on 12/12/2016 07:53:52 Cynthia Terry ([id number]) -------------------------------------------------------------------------------- Wound Assessment Details Patient Name: Cynthia Terry Date of Service: 12/11/2016 12:30 PM Medical Record Number: [id number] Patient Account Number: [id number] Date of Birth/Sex: Sep 26, 1924 (83 y.o. Female) Treating RN: Roger Shelter Primary Care Evelean Bigler: Johny Drilling Other Clinician: Referring Deshanta Lady: Johny Drilling Treating Amel Kitch/Extender: Frann Rider in Treatment: 0 Wound Status Wound Number: 2 Primary Pressure Ulcer Etiology: Wound Location: Right Foot - Dorsal Wound Open Wounding Event: Gradually Appeared Status: Date Acquired: 11/03/2016 Comorbid Cataracts, Chronic Obstructive  Pulmonary Weeks Of Treatment: 0 History: Disease (COPD), Congestive Heart Failure, Clustered Wound: No Hypertension, Gout, Rheumatoid Arthritis Photos Wound Measurements Length: (cm) 0.3 Width: (cm) 0.5 Depth: (cm) 0.2 Area: (cm) 0.118 Volume: (cm) 0.024 % Reduction in Area: 0% % Reduction in Volume: 0% Epithelialization: None Tunneling: No Undermining: No Wound Description Classification: Category/Stage II Foul Odor Wound Margin: Flat and Intact Slough/Fi Exudate Amount: Medium Exudate Type: Serosanguineous Exudate Color: red, brown After Cleansing: No brino Yes Wound Bed Granulation Amount: None Present (0%) Exposed Structure Necrotic Amount: Large (67-100%) Fascia Exposed: No Fat Layer (Subcutaneous Tissue) Exposed: Yes Tendon Exposed: No Muscle Exposed: No Joint Exposed: No Bone Exposed: No Periwound Skin Texture Texture Color  No Abnormalities Noted: No No Abnormalities Noted: No Callus: No Atrophie Blanche: No Cynthia Terry, ORMAND. ([id number]) Crepitus: No Cyanosis: No Excoriation: Yes Ecchymosis: No Induration: No Erythema: No Rash: No Hemosiderin Staining: No Scarring: No Mottled: No Pallor: No Moisture Rubor: No No Abnormalities Noted: No Dry / Scaly: No Maceration: No Wound Preparation Ulcer Cleansing: Rinsed/Irrigated with Saline Topical Anesthetic Applied: Other: lidocaine 4 %, Electronic Signature(s) Signed: 12/12/2016 8:57:55 AM By: Roger Shelter Previous Signature: 12/12/2016 7:49:03 AM Version By: Roger Shelter Entered By: Roger Shelter on 12/12/2016 07:54:09 Cynthia Terry ([id number]) -------------------------------------------------------------------------------- Vitals Details Patient Name: Cynthia Terry Date of Service: 12/11/2016 12:30 PM Medical Record Number: [id number] Patient Account Number: [id number] Date of Birth/Sex: 30-May-1924 (83 y.o. Female) Treating RN: Roger Shelter Primary Care Laelle Bridgett: Johny Drilling Other Clinician: Referring Felisia Balcom: Johny Drilling Treating Jef Futch/Extender: Frann Rider in Treatment: 0 Vital Signs Time Taken: 12:47 Temperature (F): 98.2 Height (in): 63 Pulse (bpm): 84 Source: Stated Respiratory Rate (breaths/min): 18 Weight (lbs): 202 Blood Pressure (mmHg): 142/78 Source: Stated Reference Range: 80 - 120 mg / dl Body Mass Index (BMI): 35.8 Electronic Signature(s) Signed: 12/12/2016 7:49:03 AM By: Roger Shelter Entered By: Roger Shelter on 12/11/2016 12:49:07

## 2016-12-18 ENCOUNTER — Encounter: Payer: PPO | Admitting: Surgery

## 2016-12-18 DIAGNOSIS — L89892 Pressure ulcer of other site, stage 2: Secondary | ICD-10-CM | POA: Diagnosis not present

## 2016-12-18 DIAGNOSIS — L97521 Non-pressure chronic ulcer of other part of left foot limited to breakdown of skin: Secondary | ICD-10-CM | POA: Diagnosis not present

## 2016-12-18 NOTE — Progress Notes (Addendum)
Cynthia Terry ([id number]) Visit Report for 12/18/2016 Chief Complaint Document Details Patient Name: Cynthia Terry Date of Service: 12/18/2016 3:30 PM Medical Record Number: [id number] Patient Account Number: [id number] Date of Birth/Sex: Sep 01, 1924 (83 y.o. Female) Treating RN: Roger Shelter Primary Care Provider: Johny Drilling Other Clinician: Referring Provider: Johny Drilling Treating Provider/Extender: Frann Rider in Treatment: 1 Information Obtained from: Patient Chief Complaint Patient seen for complaints of Non-Healing Wound to the left plantar metatarsal region and the right dorsum of her third toe which she's had for about a month Electronic Signature(s) Signed: 12/18/2016 4:09:03 PM By: Christin Fudge MD, FACS Entered By: Christin Fudge on 12/18/2016 16:09:03 Cynthia Terry ([id number]) -------------------------------------------------------------------------------- Debridement Details Patient Name: Cynthia Terry Date of Service: 12/18/2016 3:30 PM Medical Record Number: [id number] Patient Account Number: [id number] Date of Birth/Sex: [birth year] (83 y.o. Female) Treating RN: Roger Shelter Primary Care Provider: Johny Drilling Other Clinician: Referring Provider: Johny Drilling Treating Provider/Extender: Frann Rider in Treatment: 1 Debridement Performed for Wound #1 Left,Plantar Foot Assessment: Performed By: Physician Christin Fudge, MD Debridement: Debridement Pre-procedure Verification/Time Yes - 03:41 Out Taken: Start Time: 03:42 Pain Control: Other : lidocaine 4% Level: Skin/Subcutaneous Tissue Total Area Debrided (L x W): 0.8 (cm) x 0.8 (cm) = 0.64 (cm) Tissue and other material Viable, Non-Viable, Callus, Eschar, Fibrin/Slough, Subcutaneous debrided: Instrument: Curette Bleeding: Minimum Hemostasis Achieved: Pressure End Time: 03:42 Procedural Pain: 0 Post Procedural Pain: 0 Response to Treatment: Procedure was tolerated  well Post Debridement Measurements of Total Wound Length: (cm) 0.8 Stage: Category/Stage II Width: (cm) 0.8 Depth: (cm) 0.2 Volume: (cm) 0.101 Character of Wound/Ulcer Post Stable Debridement: Post Procedure Diagnosis Same as Pre-procedure Electronic Signature(s) Signed: 12/18/2016 4:01:21 PM By: Roger Shelter Signed: 12/18/2016 4:20:07 PM By: Christin Fudge MD, FACS Entered By: Roger Shelter on 12/18/2016 15:43:28 Cynthia Terry ([id number]) -------------------------------------------------------------------------------- HPI Details Patient Name: Cynthia Terry Date of Service: 12/18/2016 3:30 PM Medical Record Number: [id number] Patient Account Number: [id number] Date of Birth/Sex: 1924-06-17 (83 y.o. Female) Treating RN: Roger Shelter Primary Care Provider: Johny Drilling Other Clinician: Referring Provider: Johny Drilling Treating Provider/Extender: Frann Rider in Treatment: 1 History of Present Illness Location: plantar aspect of the left first metatarsal head and the dorsum of the right third toe Quality: Patient reports No Pain. Severity: Patient states wound are getting worse. Duration: Patient has had the wound for < 4 weeks prior to presenting for treatment Context: The wound appeared gradually over time Modifying Factors: Other treatment(s) tried include:local care with Neosporin and hydrogen peroxide Associated Signs and Symptoms: Patient reports having:seen a podiatrist over a year or 2 ago and they said they could not do much for her feet HPI Description: 83 year old female referred by her PCP Dr. Johny Drilling, for a ulcer on the left foot which has been there for about a month. They noted a progressive increase in size of the ulceration had not been treating it with peroxide and Neosporin. She also has polymyalgia rheumatica and is on prednisone chronically. She has never been a smoker. Review of her electronic medical records does not reveal any  recent x-ray of her foot nor is there any evidence of arterial studies done. the patient has seen podiatry about 2 years ago and at that time they did not recommend anything specific for her feet and said there was no solution to the problems due to her deformity with gout. 12/18/2016 -- -- left foot x-ray -- IMPRESSION: Soft tissue wound and degenerative changes as described.  No acute abnormality noted. Right foot x-ray -- IMPRESSION: Soft tissue changes without acute abnormality. she is still awaiting an appointment from the podiatrist Dr. Elvina Mattes Electronic Signature(s) Signed: 12/18/2016 4:09:26 PM By: Christin Fudge MD, FACS Previous Signature: 12/18/2016 3:16:13 PM Version By: Christin Fudge MD, FACS Entered By: Christin Fudge on 12/18/2016 16:09:26 Cynthia Terry ([id number]) -------------------------------------------------------------------------------- Physical Exam Details Patient Name: Cynthia Terry Date of Service: 12/18/2016 3:30 PM Medical Record Number: [id number] Patient Account Number: [id number] Date of Birth/Sex: 1925-01-16 (83 y.o. Female) Treating RN: Roger Shelter Primary Care Provider: Johny Drilling Other Clinician: Referring Provider: Johny Drilling Treating Provider/Extender: Frann Rider in Treatment: 1 Constitutional . Pulse regular. Respirations normal and unlabored. Afebrile. . Eyes Nonicteric. Reactive to light. Ears, Nose, Mouth, and Throat Lips, teeth, and gums WNL.Marland Kitchen Moist mucosa without lesions. Neck supple and nontender. No palpable supraclavicular or cervical adenopathy. Normal sized without goiter. Respiratory WNL. No retractions.. Cardiovascular Pedal Pulses WNL. No clubbing, cyanosis or edema. Lymphatic No adneopathy. No adenopathy. No adenopathy. Musculoskeletal Adexa without tenderness or enlargement.. Digits and nails w/o clubbing, cyanosis, infection, petechiae, ischemia, or inflammatory conditions.. Integumentary (Hair,  Skin) No suspicious lesions. No crepitus or fluctuance. No peri-wound warmth or erythema. No masses.Marland Kitchen Psychiatric Judgement and insight Intact.. No evidence of depression, anxiety, or agitation.. Notes the plantar aspect of her first metatarsal head needed some sharp debridement with a #3 curet and the wound was saucerized hence a lot of the superficial debris and subcutaneous tissue was removed. Electronic Signature(s) Signed: 12/18/2016 4:10:36 PM By: Christin Fudge MD, FACS Entered By: Christin Fudge on 12/18/2016 16:10:35 Cynthia Terry ([id number]) -------------------------------------------------------------------------------- Physician Orders Details Patient Name: Cynthia Terry Date of Service: 12/18/2016 3:30 PM Medical Record Number: [id number] Patient Account Number: [id number] Date of Birth/Sex: 1924/03/30 (83 y.o. Female) Treating RN: Roger Shelter Primary Care Provider: Johny Drilling Other Clinician: Referring Provider: Johny Drilling Treating Provider/Extender: Frann Rider in Treatment: 1 Verbal / Phone Orders: No Diagnosis Coding Wound Cleansing Wound #1 Left,Plantar Foot o Clean wound with Normal Saline. Wound #2 Right,Dorsal Foot o Clean wound with Normal Saline. Anesthetic Wound #1 Left,Plantar Foot o Topical Lidocaine 4% cream applied to wound bed prior to debridement Wound #2 Right,Dorsal Foot o Topical Lidocaine 4% cream applied to wound bed prior to debridement Primary Wound Dressing Wound #1 Left,Plantar Foot o Silvercel Non-Adherent Wound #2 Right,Dorsal Foot o Silvercel Non-Adherent Secondary Dressing Wound #1 Left,Plantar Foot o Dry Gauze Wound #2 Right,Dorsal Foot o Gauze and Kerlix/Conform Dressing Change Frequency Wound #1 Left,Plantar Foot o Change dressing every other day. Wound #2 Right,Dorsal Foot o Change dressing every other day. Follow-up Appointments Wound #1 Left,Plantar Foot o Return Appointment in  1 week. Wound #2 Right,Dorsal Foot o Return Appointment in 1 week. Cynthia Terry, Cynthia Terry ([id number]) Electronic Signature(s) Signed: 12/18/2016 4:20:07 PM By: Christin Fudge MD, FACS Previous Signature: 12/18/2016 4:01:21 PM Version By: Roger Shelter Entered By: Christin Fudge on 12/18/2016 16:10:51 Cynthia Terry ([id number]) -------------------------------------------------------------------------------- Problem List Details Patient Name: Cynthia Terry Date of Service: 12/18/2016 3:30 PM Medical Record Number: [id number] Patient Account Number: [id number] Date of Birth/Sex: Dec 29, 1924 (83 y.o. Female) Treating RN: Roger Shelter Primary Care Provider: Johny Drilling Other Clinician: Referring Provider: Johny Drilling Treating Provider/Extender: Frann Rider in Treatment: 1 Active Problems ICD-10 Encounter Code Description Active Date Diagnosis L97.521 Non-pressure chronic ulcer of other part of left foot limited to 12/11/2016 Yes breakdown of skin L97.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  layer 12/11/2016 Yes exposed M10.072 Idiopathic gout, left ankle and foot 12/11/2016 Yes M10.071 Idiopathic gout, right ankle and foot 12/11/2016 Yes Inactive Problems Resolved Problems Electronic Signature(s) Signed: 12/18/2016 4:08:42 PM By: Christin Fudge MD, FACS Entered By: Christin Fudge on 12/18/2016 16:08:42 Cynthia Terry ([id number]) -------------------------------------------------------------------------------- Progress Note Details Patient Name: Cynthia Terry Date of Service: 12/18/2016 3:30 PM Medical Record Number: [id number] Patient Account Number: [id number] Date of Birth/Sex: [birth year] (83 y.o. Female) Treating RN: Roger Shelter Primary Care Provider: Johny Drilling Other Clinician: Referring Provider: Johny Drilling Treating Provider/Extender: Frann Rider in Treatment: 1 Subjective Chief Complaint Information obtained  from Patient Patient seen for complaints of Non-Healing Wound to the left plantar metatarsal region and the right dorsum of her third toe which she's had for about a month History of Present Illness (HPI) The following HPI elements were documented for the patient's wound: Location: plantar aspect of the left first metatarsal head and the dorsum of the right third toe Quality: Patient reports No Pain. Severity: Patient states wound are getting worse. Duration: Patient has had the wound for < 4 weeks prior to presenting for treatment Context: The wound appeared gradually over time Modifying Factors: Other treatment(s) tried include:local care with Neosporin and hydrogen peroxide Associated Signs and Symptoms: Patient reports having:seen a podiatrist over a year or 2 ago and they said they could not do much for her feet 83 year old female referred by her PCP Dr. Johny Drilling, for a ulcer on the left foot which has been there for about a month. They noted a progressive increase in size of the ulceration had not been treating it with peroxide and Neosporin. She also has polymyalgia rheumatica and is on prednisone chronically. She has never been a smoker. Review of her electronic medical records does not reveal any recent x-ray of her foot nor is there any evidence of arterial studies done. the patient has seen podiatry about 2 years ago and at that time they did not recommend anything specific for her feet and said there was no solution to the problems due to her deformity with gout. 12/18/2016 -- -- left foot x-ray -- IMPRESSION: Soft tissue wound and degenerative changes as described. No acute abnormality noted. Right foot x-ray -- IMPRESSION: Soft tissue changes without acute abnormality. she is still awaiting an appointment from the podiatrist Dr. Elvina Mattes Patient History Information obtained from Patient. Family History Cancer - Father, Heart Disease - Mother, Hypertension -  Mother,Siblings, No family history of Diabetes, Kidney Disease, Lung Disease, Seizures, Stroke, Thyroid Problems, Tuberculosis. Social History Never smoker, Marital Status - Widowed, Alcohol Use - Never, Drug Use - No History, Caffeine Use - Rarely. Cynthia Terry, Cynthia Terry ([id number]) Objective Constitutional Pulse regular. Respirations normal and unlabored. Afebrile. Vitals Time Taken: 3:15 AM, Height: 63 in, Weight: 202 lbs, BMI: 35.8, Temperature: 97.5 F, Pulse: 68 bpm, Respiratory Rate: 18 breaths/min, Blood Pressure: 177/74 mmHg. Eyes Nonicteric. Reactive to light. Ears, Nose, Mouth, and Throat Lips, teeth, and gums WNL.Marland Kitchen Moist mucosa without lesions. Neck supple and nontender. No palpable supraclavicular or cervical adenopathy. Normal sized without goiter. Respiratory WNL. No retractions.. Cardiovascular Pedal Pulses WNL. No clubbing, cyanosis or edema. Lymphatic No adneopathy. No adenopathy. No adenopathy. Musculoskeletal Adexa without tenderness or enlargement.. Digits and nails w/o clubbing, cyanosis, infection, petechiae, ischemia, or inflammatory conditions.Marland Kitchen Psychiatric Judgement and insight Intact.. No evidence of depression, anxiety, or agitation.. General Notes: the plantar aspect of her first metatarsal head needed some sharp debridement with a #3 curet and the  wound was saucerized hence a lot of the superficial debris and subcutaneous tissue was removed. Integumentary (Hair, Skin) No suspicious lesions. No crepitus or fluctuance. No peri-wound warmth or erythema. No masses.. Wound #1 status is Open. Original cause of wound was Gradually Appeared. The wound is located on the Windmill. The wound measures 0.8cm length x 0.8cm width x 0.2cm depth; 0.503cm^2 area and 0.101cm^3 volume. There is Fat Layer (Subcutaneous Tissue) Exposed exposed. There is no tunneling or undermining noted. There is a medium amount of serous drainage noted. The wound margin is flat and  intact. There is large (67-100%) pink granulation within the wound bed. There is a small (1-33%) amount of necrotic tissue within the wound bed including Adherent Slough. The periwound skin appearance exhibited: Callus, Maceration. The periwound skin appearance did not exhibit: Crepitus, Excoriation, Induration, Rash, Scarring, Dry/Scaly, Atrophie Blanche, Cyanosis, Ecchymosis, Hemosiderin Staining, Mottled, Pallor, Rubor, Erythema. Periwound temperature was noted as No Abnormality. Wound #2 status is Open. Original cause of wound was Gradually Appeared. The wound is located on the Right,Dorsal Foot. The wound measures 0.4cm length x 0.6cm width x 0.1cm depth; 0.188cm^2 area and 0.019cm^3 volume. There is Fat Layer (Subcutaneous Tissue) Exposed exposed. There is no tunneling or undermining noted. There is a medium amount of Shook, Cynthia S. ([id number]) serosanguineous drainage noted. The wound margin is flat and intact. There is no granulation within the wound bed. There is a large (67-100%) amount of necrotic tissue within the wound bed including Eschar. The periwound skin appearance exhibited: Excoriation. The periwound skin appearance did not exhibit: Callus, Crepitus, Induration, Rash, Scarring, Dry/Scaly, Maceration, Atrophie Blanche, Cyanosis, Ecchymosis, Hemosiderin Staining, Mottled, Pallor, Rubor, Erythema. 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 There was a Skin/Subcutaneous Tissue Debridement (16606-30160) debridement with total area of 0.64 sq cm performed by Christin Fudge, MD. with the following  instrument(s): Curette to remove Viable and Non-Viable tissue/material including Fibrin/Slough, Eschar, Callus, and Subcutaneous after achieving pain control using Other (lidocaine 4%). A time out was conducted at 03:41, prior to the start of the procedure. A Minimum amount of bleeding was controlled with Pressure. The procedure was tolerated well with a pain level of 0 throughout and a pain level of 0 following the procedure. Post Debridement Measurements: 0.8cm length x 0.8cm width x 0.2cm depth; 0.101cm^3 volume. Post debridement Stage noted as Category/Stage II. Character of Wound/Ulcer Post Debridement is stable. Post procedure Diagnosis Wound #1: Same as Pre-Procedure Plan Wound Cleansing: Wound #1 Left,Plantar Foot: Clean wound with Normal Saline. Wound #2 Right,Dorsal Foot: Clean wound with Normal Saline. Anesthetic: Wound #1 Left,Plantar Foot: Topical Lidocaine 4% cream applied to wound bed prior to debridement Wound #2 Right,Dorsal Foot: Topical Lidocaine 4% cream applied to wound bed prior to debridement Primary Wound Dressing: Wound #1 Left,Plantar Foot: Silvercel Non-Adherent Wound #2 Right,Dorsal Foot: Silvercel Non-Adherent Cynthia Terry, Cynthia S. ([id number]) Secondary Dressing: Wound #1 Left,Plantar Foot: Dry Gauze Wound #2 Right,Dorsal Foot: Gauze and Kerlix/Conform Dressing Change Frequency: Wound #1 Left,Plantar Foot: Change dressing every other day. Wound #2 Right,Dorsal Foot: Change dressing every other day. Follow-up Appointments: Wound #1 Left,Plantar Foot: Return Appointment in 1 week. Wound #  2 Right,Dorsal Foot: Return Appointment in 1 week. She is still awaiting her appointment from the podiatrist. After review today and some sharp debridement on the left plantar aspect of her foot I have recommended: 1. Silver alginate and an offloading felt on the plantar aspect of the left foot and also some silver alginate on the dorsum of the right third toe. 2. x-ray  of both feet -- results reviewed with the patient and her family 3. Offloading has been discussed with her in great detail 4. Adequate protein, Vitamin A, vitamin C and zinc. 5. I have asked her to see our podiatry group again to reevaluate regarding options of treating her deformity and also the possibility of getting her orthotic shoes made -- appointment still pending 6. regular visits to the wound center Electronic Signature(s) Signed: 12/18/2016 4:12:00 PM By: Christin Fudge MD, FACS Entered By: Christin Fudge on 12/18/2016 16:12:00 Cynthia Terry ([id number]) -------------------------------------------------------------------------------- ROS/PFSH Details Patient Name: Cynthia Terry Date of Service: 12/18/2016 3:30 PM Medical Record Number: [id number] Patient Account Number: [id number] Date of Birth/Sex: 1924/05/24 (83 y.o. Female) Treating RN: Roger Shelter Primary Care Provider: Johny Drilling Other Clinician: Referring Provider: Johny Drilling Treating Provider/Extender: Frann Rider in Treatment: 1 Information Obtained From Patient Wound History Do you currently have one or more open woundso Yes How many open wounds do you currently haveo 3 Approximately how long have you had your woundso 3 to 4 weeks How have you been treating your wound(s) until nowo cleaning, peroxide, neosporin and dressing Has your wound(s) ever healed and then re-openedo No Have you had any lab work done in the past montho No Have you tested positive for an antibiotic resistant organism (MRSA, No VRE)o Have you tested positive for osteomyelitis (bone infection)o No Have you had any tests for circulation on your legso No Eyes Medical History: Positive for: Cataracts Negative for: Glaucoma; Optic Neuritis Ear/Nose/Mouth/Throat Medical History: Negative for: Middle ear problems Hematologic/Lymphatic Medical History: Negative for: Anemia; Hemophilia; Human Immunodeficiency Virus;  Lymphedema; Sickle Cell Disease Respiratory Medical History: Positive for: Chronic Obstructive Pulmonary Disease (COPD) Negative for: Aspiration; Asthma; Pneumothorax; Sleep Apnea; Tuberculosis Cardiovascular Medical History: Positive for: Congestive Heart Failure; Hypertension Negative for: Angina; Arrhythmia; Coronary Artery Disease; Deep Vein Thrombosis; Myocardial Infarction; Peripheral Arterial Disease; Peripheral Venous Disease; Phlebitis; Vasculitis Gastrointestinal Medical History: Negative for: Cirrhosis ; Colitis; Crohnos; Hepatitis A; Hepatitis B; Hepatitis C Antony, Maytal S. ([id number]) Endocrine Medical History: Negative for: Type I Diabetes; Type II Diabetes Genitourinary Medical History: Negative for: End Stage Renal Disease Immunological Medical History: Negative for: Lupus Erythematosus; Raynaudos; Scleroderma Integumentary (Skin) Medical History: Negative for: History of Burn; History of pressure wounds Musculoskeletal Medical History: Positive for: Gout; Rheumatoid Arthritis Negative for: Osteoarthritis; Osteomyelitis Neurologic Medical History: Negative for: Dementia; Neuropathy; Quadriplegia; Paraplegia; Seizure Disorder Oncologic Medical History: Negative for: Received Chemotherapy; Received Radiation Psychiatric Medical History: Negative for: Anorexia/bulimia; Confinement Anxiety HBO Extended History Items Eyes: Cataracts Immunizations Pneumococcal Vaccine: Received Pneumococcal Vaccination: Yes Tetanus Vaccine: Last tetanus shot: 12/12/2014 Implantable Devices Family and Social History Cancer: Yes - Father; Diabetes: No; Heart Disease: Yes - Mother; Hypertension: Yes - Mother,Siblings; Kidney Disease: No; Lung Disease: No; Seizures: No; Stroke: No; Thyroid Problems: No; Tuberculosis: No; Never smoker; Marital Status - Widowed; Alcohol Use: Never; Drug Use: No History; Caffeine Use: Rarely; Financial Concerns: No; Food, Clothing or Shelter  Needs: No; Support System Lacking: No; Transportation Concerns: No; Advanced Directives: Yes (Not Provided); Patient does not want information on Advanced Directives; Do not  resuscitate: No; Living Will: Yes (Not Provided) Cynthia Terry, Cynthia Terry ([id number]) Physician Affirmation I have reviewed and agree with the above information. Electronic Signature(s) Signed: 12/18/2016 4:16:06 PM By: Roger Shelter Signed: 12/18/2016 4:20:07 PM By: Christin Fudge MD, FACS Previous Signature: 12/18/2016 4:01:21 PM Version By: Roger Shelter Entered By: Christin Fudge on 12/18/2016 16:09:41 Cynthia Terry ([id number]) -------------------------------------------------------------------------------- SuperBill Details Patient Name: Cynthia Terry Date of Service: 12/18/2016 Medical Record Number: [id number] Patient Account Number: [id number] Date of Birth/Sex: 04-30-1924 (83 y.o. 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 M10.072 Idiopathic gout, left ankle and foot M10.071 Idiopathic gout, right ankle and foot Facility Procedures CPT4 Code Description: [id number] 11042 - DEB SUBQ TISSUE 20 SQ CM/< ICD-10 Diagnosis Description L97.521 Non-pressure chronic ulcer of other part of left foot limited t L97.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 M10.072  Idiopathic gout, left ankle and foot M10.071 Idiopathic gout, right ankle and foot Modifier: o breakdown of s layer exposed Quantity: 1 kin Physician Procedures CPT4 Code Description: [id number] 76195 - WC PHYS LEVEL 3 - EST PT ICD-10 Diagnosis Description L97.521  Non-pressure chronic ulcer of other part of left foot limited t L97.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 M10.072  Idiopathic gout, left ankle and foot M10.071 Idiopathic gout, right ankle and foot Modifier: 25 o breakdown of sk layer exposed Quantity: 1 in CPT4 Code Description: [id number] 11042 - WC PHYS SUBQ TISS 20 SQ CM ICD-10 Diagnosis Description L97.521 Non-pressure chronic ulcer of other part of left foot limited t L97.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 M10.072  Idiopathic gout, left ankle and foot M10.071 Idiopathic gout, right ankle and foot Modifier: o breakdown of sk layer exposed Quantity: 1 in Electronic Signature(s) Signed: 12/18/2016 4:12:22 PM By: Christin Fudge MD, FACS Entered By: Christin Fudge on 12/18/2016 16:12:21

## 2016-12-18 NOTE — Progress Notes (Addendum)
Cynthia Terry, Cynthia Terry ([id number]) Visit Report for 12/18/2016 Arrival Information Details Patient Name: Cynthia Terry, Cynthia Terry Date of Service: 12/18/2016 3:30 PM Medical Record Number: [id number] Patient Account Number: [id number] Date of Birth/Sex: 06/09/1924 (83 y.o. Female) Treating RN: Roger Shelter Primary Care Jaquawn Saffran: Johny Drilling Other Clinician: Referring Primrose Oler: Johny Drilling Treating Isac Lincks/Extender: Frann Rider in Treatment: 1 Visit Information History Since Last Visit All ordered tests and consults were completed: No Patient Arrived: Wheel Chair Added or deleted any medications: No Arrival Time: 15:14 Any new allergies or adverse reactions: No Accompanied By: family Had a fall or experienced change in No activities of daily living that may affect Transfer Assistance: None risk of falls: Patient Identification Verified: Yes Signs or symptoms of abuse/neglect since last visito No Secondary Verification Process Completed: Yes Hospitalized since last visit: No Patient Requires Transmission-Based No Pain Present Now: No Precautions: Patient Has Alerts: No Electronic Signature(s) Signed: 12/18/2016 4:01:21 PM By: Roger Shelter Entered By: Roger Shelter on 12/18/2016 15:15:27 Cynthia Terry ([id number]) -------------------------------------------------------------------------------- Encounter Discharge Information Details Patient Name: Cynthia Terry Date of Service: 12/18/2016 3:30 PM Medical Record Number: [id number] Patient Account Number: [id number] Date of Birth/Sex: 01/08/1925 (83 y.o. Female) Treating RN: Roger Shelter Primary Care Ozell Ferrera: Johny Drilling Other Clinician: Referring Westen Dinino: Johny Drilling Treating Yoseph Haile/Extender: Frann Rider in Treatment: 1 Encounter Discharge Information Items Discharge Pain Level: 0 Discharge Condition: Stable Ambulatory Status: Wheelchair Discharge Destination: Home Transportation: Private  Auto daughter and Accompanied By: son Schedule Follow-up Appointment: Yes Medication Reconciliation completed and No provided to Patient/Care Stephaun Million: Provided on Clinical Summary of Care: 12/18/2016 Form Type Recipient Paper Patient BI Electronic Signature(s) Signed: 12/18/2016 4:01:21 PM By: Roger Shelter Entered By: Roger Shelter on 12/18/2016 15:59:10 Cynthia Terry ([id number]) -------------------------------------------------------------------------------- Lower Extremity Assessment Details Patient Name: Cynthia Terry Date of Service: 12/18/2016 3:30 PM Medical Record Number: [id number] Patient Account Number: [id number] Date of Birth/Sex: April 10, 1924 (83 y.o. Female) Treating RN: Roger Shelter Primary Care Vihana Kydd: Johny Drilling Other Clinician: Referring Jakya Dovidio: Johny Drilling Treating Robbert Langlinais/Extender: Frann Rider in Treatment: 1 Edema Assessment Assessed: [Left: No] [Right: No] Edema: [Left: Yes] [Right: Yes] Vascular Assessment Claudication: Claudication Assessment [Left:None] [Right:None] Pulses: Dorsalis Pedis Palpable: [Left:Yes] [Right:Yes] Posterior Tibial Extremity colors, hair growth, and conditions: Extremity Color: [Left:Normal] [Right:Normal] Hair Growth on Extremity: [Left:No] [Right:No] Temperature of Extremity: [Left:Cool] [Right:Cool] Capillary Refill: [Left:< 3 seconds] [Right:< 3 seconds] Toe Nail Assessment Left: Right: Thick: No No Discolored: No No Deformed: No No Improper Length and Hygiene: No No Electronic Signature(s) Signed: 12/18/2016 4:01:21 PM By: Roger Shelter Entered By: Roger Shelter on 12/18/2016 15:30:38 Cynthia Terry ([id number]) -------------------------------------------------------------------------------- Multi Terry Chart Details Patient Name: Cynthia Terry Date of Service: 12/18/2016 3:30 PM Medical Record Number: [id number] Patient Account Number: [id number] Date of Birth/Sex:  [birth year] (83 y.o. Female) Treating RN: Roger Shelter Primary Care Boyd Buffalo: Johny Drilling Other Clinician: Referring Marquell Saenz: Johny Drilling Treating Lanson Randle/Extender: Frann Rider in Treatment: 1 Vital Signs Height(in): 47 Pulse(bpm): 44 Weight(lbs): 202 Blood Pressure(mmHg): 177/74 Body Mass Index(BMI): 36 Temperature(F): 97.5 Respiratory Rate 18 (breaths/min): Photos: [N/A:N/A] Terry Location: Left Foot - Plantar Right Foot - Dorsal N/A Wounding Event: Gradually Appeared Gradually Appeared N/A Primary Etiology: Pressure Ulcer Pressure Ulcer N/A Comorbid History: Cataracts, Chronic Obstructive Cataracts, Chronic Obstructive N/A Pulmonary Disease (COPD), Pulmonary Disease (COPD), Congestive Heart Failure, Congestive Heart Failure, Hypertension, Gout, Hypertension, Gout, Rheumatoid Arthritis Rheumatoid Arthritis Date Acquired: 11/03/2016 11/03/2016 N/A Weeks of Treatment: 1 1 N/A Terry Status: Open Open N/A Measurements L  x W x D 0.8x0.8x0.2 0.4x0.6x0.1 N/A (cm) Area (cm) : 0.503 0.188 N/A Volume (cm) : 0.101 0.019 N/A % Reduction in Area: 46.60% -59.30% N/A % Reduction in Volume: 46.30% 20.80% N/A Classification: Category/Stage II Category/Stage II N/A Exudate Amount: Medium Medium N/A Exudate Type: Serous Serosanguineous N/A Exudate Color: amber red, brown N/A Terry Margin: Flat and Intact Flat and Intact N/A Granulation Amount: Large (67-100%) None Present (0%) N/A Granulation Quality: Pink N/A N/A Necrotic Amount: Small (1-33%) Large (67-100%) N/A Necrotic Tissue: Adherent Slough Eschar N/A Exposed Structures: Fat Layer (Subcutaneous Fat Layer (Subcutaneous N/A Tissue) Exposed: Yes Tissue) Exposed: Yes Fascia: No Fascia: No Tendon: No Tendon: No Muscle: No Muscle: No Cynthia, Carlotta S. ([id number]) Joint: No Joint: No Bone: No Bone: No Epithelialization: None None N/A Debridement: Debridement ([id number]) N/A N/A Pre-procedure 03:41 N/A  N/A Verification/Time Out Taken: Pain Control: Other N/A N/A Tissue Debrided: Necrotic/Eschar, N/A N/A Fibrin/Slough, Callus, Subcutaneous Level: Skin/Subcutaneous Tissue N/A N/A Debridement Area (sq cm): 0.64 N/A N/A Instrument: Curette N/A N/A Bleeding: Minimum N/A N/A Hemostasis Achieved: Pressure N/A N/A Procedural Pain: 0 N/A N/A Post Procedural Pain: 0 N/A N/A Debridement Treatment Procedure was tolerated well N/A N/A Response: Post Debridement 0.8x0.8x0.2 N/A N/A Measurements L x W x D (cm) Post Debridement Volume: 0.101 N/A N/A (cm) Post Debridement Stage: Category/Stage II N/A N/A Periwound Skin Texture: Callus: Yes Excoriation: Yes N/A Excoriation: No Induration: No Induration: No Callus: No Crepitus: No Crepitus: No Rash: No Rash: No Scarring: No Scarring: No Periwound Skin Moisture: Maceration: Yes Maceration: No N/A Dry/Scaly: No Dry/Scaly: No Periwound Skin Color: Atrophie Blanche: No Atrophie Blanche: No N/A Cyanosis: No Cyanosis: No Ecchymosis: No Ecchymosis: No Erythema: No Erythema: No Hemosiderin Staining: No Hemosiderin Staining: No Mottled: No Mottled: No Pallor: No Pallor: No Rubor: No Rubor: No Temperature: No Abnormality N/A N/A Tenderness on Palpation: No No N/A Terry Preparation: Ulcer Cleansing: Ulcer Cleansing: N/A Rinsed/Irrigated with Saline Rinsed/Irrigated with Saline Topical Anesthetic Applied: Topical Anesthetic Applied: Other: lidocaine 4 % Other: lidocaine 4 % Procedures Performed: Debridement N/A N/A Treatment Notes Terry #1 (Left, Plantar Foot) 1. Cleansed with: Clean Terry with Normal Saline 2. Anesthetic Topical Lidocaine 4% cream to Terry bed prior to debridement 4. Dressing Applied: Cynthia Terry, Cynthia Terry ([id number]) Other dressing (specify in notes) 5. Secondary Dressing Applied Dry Gauze Notes kerlix and conform Terry #2 (Right, Dorsal Foot) 1. Cleansed with: Clean Terry with Normal Saline 2.  Anesthetic Topical Lidocaine 4% cream to Terry bed prior to debridement Notes coverlet Electronic Signature(s) Signed: 12/18/2016 4:08:49 PM By: Christin Fudge MD, FACS Previous Signature: 12/18/2016 4:01:21 PM Version By: Roger Shelter Entered By: Christin Fudge on 12/18/2016 16:08:49 Cynthia Terry ([id number]) -------------------------------------------------------------------------------- Towns Details Patient Name: Cynthia Terry Date of Service: 12/18/2016 3:30 PM Medical Record Number: [id number] Patient Account Number: [id number] Date of Birth/Sex: 10/16/1924 (83 y.o. Female) Treating RN: Roger Shelter Primary Care Marguerette Sheller: Johny Drilling Other Clinician: Referring Gabrille Kilbride: Johny Drilling Treating Gennesis Hogland/Extender: Frann Rider in Treatment: 1 Active Inactive ` Orientation to the [facility] Nursing Diagnoses: Knowledge deficit related to the [facility] program Goals: Patient/caregiver will verbalize understanding of the [place] [facility] Date Initiated: 12/11/2016 Target Resolution Date: 01/01/2017 Goal Status: Active Interventions: Provide education on orientation to the [facility] Notes: ` Pressure Nursing Diagnoses: Knowledge deficit related to causes and risk factors for pressure ulcer development Knowledge deficit related to management of pressures ulcers Goals: Patient will remain free from development of additional  pressure ulcers Date Initiated: 12/11/2016 Target Resolution Date: 01/02/2017 Goal Status: Active Patient will remain free of pressure ulcers Date Initiated: 12/11/2016 Target Resolution Date: 01/02/2017 Goal Status: Active Patient/caregiver will verbalize risk factors for pressure ulcer development Date Initiated: 12/11/2016 Target Resolution Date: 01/02/2017 Goal Status: Active Patient/caregiver will verbalize understanding of pressure ulcer management Date Initiated:  12/11/2016 Target Resolution Date: 01/02/2017 Goal Status: Active Interventions: Assess: immobility, friction, shearing, incontinence upon admission and as needed Assess potential for pressure ulcer upon admission and as needed Provide education on pressure ulcers Notes: EMILY, MASSAR ([id number]) ` Terry/Skin Impairment Nursing Diagnoses: Impaired tissue integrity Knowledge deficit related to ulceration/compromised skin integrity Goals: Ulcer/skin breakdown will have a volume reduction of 30% by week 4 Date Initiated: 12/11/2016 Target Resolution Date: 01/02/2017 Goal Status: Active Interventions: Assess patient/caregiver ability to obtain necessary supplies Assess patient/caregiver ability to perform ulcer/skin care regimen upon admission and as needed Assess ulceration(s) every visit Provide education on ulcer and skin care Notes: Electronic Signature(s) Signed: 12/18/2016 4:01:21 PM By: Roger Shelter Entered By: Roger Shelter on 12/18/2016 15:30:46 Cynthia Terry ([id number]) -------------------------------------------------------------------------------- Pain Assessment Details Patient Name: Cynthia Terry Date of Service: 12/18/2016 3:30 PM Medical Record Number: [id number] Patient Account Number: [id number] Date of Birth/Sex: 10/20/1924 (83 y.o. Female) Treating RN: Roger Shelter Primary Care Tanji Storrs: Johny Drilling Other Clinician: Referring Edda Orea: Johny Drilling Treating Nolita Kutter/Extender: Frann Rider in Treatment: 1 Active Problems Location of Pain Severity and Description of Pain Patient Has Paino No Site Locations Pain Management and Medication Current Pain Management: Electronic Signature(s) Signed: 12/18/2016 4:01:21 PM By: Roger Shelter Entered By: Roger Shelter on 12/18/2016 15:15:35 Cynthia Terry ([id number]) -------------------------------------------------------------------------------- Patient/Caregiver Education  Details Patient Name: Cynthia Terry Date of Service: 12/18/2016 3:30 PM Medical Record Number: [id number] Patient Account Number: [id number] Date of Birth/Gender: 02-01-1924 (83 y.o. Female) Treating RN: Roger Shelter Primary Care Physician: Johny Drilling Other Clinician: Referring Physician: Johny Drilling Treating Physician/Extender: Frann Rider in Treatment: 1 Education Assessment Education Provided To: Patient and Caregiver daughter Education Topics Provided Terry Debridement: Handouts: Terry Debridement Methods: Explain/Verbal Responses: State content correctly Terry/Skin Impairment: Handouts: Caring for Your Ulcer Methods: Explain/Verbal Responses: State content correctly Electronic Signature(s) Signed: 12/18/2016 4:01:21 PM By: Roger Shelter Entered By: Roger Shelter on 12/18/2016 15:59:35 Cynthia Terry ([id number]) -------------------------------------------------------------------------------- Terry Assessment Details Patient Name: Cynthia Terry Date of Service: 12/18/2016 3:30 PM Medical Record Number: [id number] Patient Account Number: [id number] Date of Birth/Sex: Dec 01, 1924 (83 y.o. Female) Treating RN: Roger Shelter Primary Care Linzy Laury: Johny Drilling Other Clinician: Referring Amaura Authier: Johny Drilling Treating Ebunoluwa Gernert/Extender: Frann Rider in Treatment: 1 Terry Status Terry Number: 1 Primary Pressure Ulcer Etiology: Terry Location: Left Foot - Plantar Terry Open Wounding Event: Gradually Appeared Status: Date Acquired: 11/03/2016 Comorbid Cataracts, Chronic Obstructive Pulmonary Weeks Of Treatment: 1 History: Disease (COPD), Congestive Heart Failure, Clustered Terry: No Hypertension, Gout, Rheumatoid Arthritis Photos Terry Measurements Length: (cm) 0.8 Width: (cm) 0.8 Depth: (cm) 0.2 Area: (cm) 0.503 Volume: (cm) 0.101 % Reduction in Area: 46.6% % Reduction in Volume: 46.3% Epithelialization: None Tunneling:  No Undermining: No Terry Description Classification: Category/Stage II Foul Odo Terry Margin: Flat and Intact Slough/F Exudate Amount: Medium Exudate Type: Serous Exudate Color: amber r After Cleansing: No ibrino No Terry Bed Granulation Amount: Large (67-100%) Exposed Structure Granulation Quality: Pink Fascia Exposed: No Necrotic Amount: Small (1-33%) Fat Layer (Subcutaneous Tissue) Exposed: Yes Necrotic Quality: Adherent Slough Tendon Exposed: No Muscle Exposed: No Joint Exposed: No Bone Exposed: No Periwound Skin  Texture Texture Color No Abnormalities Noted: No No Abnormalities Noted: No Callus: Yes Atrophie Blanche: No Cynthia Terry, Cynthia Terry. ([id number]) Crepitus: No Cyanosis: No Excoriation: No Ecchymosis: No Induration: No Erythema: No Rash: No Hemosiderin Staining: No Scarring: No Mottled: No Pallor: No Moisture Rubor: No No Abnormalities Noted: No Dry / Scaly: No Temperature / Pain Maceration: Yes Temperature: No Abnormality Terry Preparation Ulcer Cleansing: Rinsed/Irrigated with Saline Topical Anesthetic Applied: Other: lidocaine 4 %, Treatment Notes Terry #1 (Left, Plantar Foot) 1. Cleansed with: Clean Terry with Normal Saline 2. Anesthetic Topical Lidocaine 4% cream to Terry bed prior to debridement 4. Dressing Applied: Other dressing (specify in notes) 5. Secondary Dressing Applied Dry Gauze Notes kerlix and conform Electronic Signature(s) Signed: 12/18/2016 4:01:21 PM By: Roger Shelter Entered By: Roger Shelter on 12/18/2016 15:27:40 Cynthia Terry ([id number]) -------------------------------------------------------------------------------- Terry Assessment Details Patient Name: Cynthia Terry Date of Service: 12/18/2016 3:30 PM Medical Record Number: [id number] Patient Account Number: [id number] Date of Birth/Sex: [birth year] (83 y.o. Female) Treating RN: Roger Shelter Primary Care Finnick Orosz: Johny Drilling Other  Clinician: Referring Jaylenn Altier: Johny Drilling Treating Lamone Ferrelli/Extender: Frann Rider in Treatment: 1 Terry Status Terry Number: 2 Primary Pressure Ulcer Etiology: Terry Location: Right Foot - Dorsal Terry Open Wounding Event: Gradually Appeared Status: Date Acquired: 11/03/2016 Comorbid Cataracts, Chronic Obstructive Pulmonary Weeks Of Treatment: 1 History: Disease (COPD), Congestive Heart Failure, Clustered Terry: No Hypertension, Gout, Rheumatoid Arthritis Photos Terry Measurements Length: (cm) 0.4 Width: (cm) 0.6 Depth: (cm) 0.1 Area: (cm) 0.188 Volume: (cm) 0.019 % Reduction in Area: -59.3% % Reduction in Volume: 20.8% Epithelialization: None Tunneling: No Undermining: No Terry Description Classification: Category/Stage II Terry Margin: Flat and Intact Exudate Amount: Medium Exudate Type: Serosanguineous Exudate Color: red, brown Foul Odor After Cleansing: No Slough/Fibrino Yes Terry Bed Granulation Amount: None Present (0%) Exposed Structure Necrotic Amount: Large (67-100%) Fascia Exposed: No Necrotic Quality: Eschar Fat Layer (Subcutaneous Tissue) Exposed: Yes Tendon Exposed: No Muscle Exposed: No Joint Exposed: No Bone Exposed: No Periwound Skin Texture Texture Color No Abnormalities Noted: No No Abnormalities Noted: No Callus: No Atrophie Blanche: No Cynthia Terry, Cynthia Terry. ([id number]) Crepitus: No Cyanosis: No Excoriation: Yes Ecchymosis: No Induration: No Erythema: No Rash: No Hemosiderin Staining: No Scarring: No Mottled: No Pallor: No Moisture Rubor: No No Abnormalities Noted: No Dry / Scaly: No Maceration: No Terry Preparation Ulcer Cleansing: Rinsed/Irrigated with Saline Topical Anesthetic Applied: Other: lidocaine 4 %, Treatment Notes Terry #2 (Right, Dorsal Foot) 1. Cleansed with: Clean Terry with Normal Saline 2. Anesthetic Topical Lidocaine 4% cream to Terry bed prior to debridement Notes coverlet Electronic  Signature(s) Signed: 12/18/2016 4:01:21 PM By: Roger Shelter Entered By: Roger Shelter on 12/18/2016 15:29:31 Cynthia Terry ([id number]) -------------------------------------------------------------------------------- Vitals Details Patient Name: Cynthia Terry Date of Service: 12/18/2016 3:30 PM Medical Record Number: [id number] Patient Account Number: [id number] Date of Birth/Sex: 12-31-1924 (83 y.o. Female) Treating RN: Roger Shelter Primary Care Lindi Abram: Johny Drilling Other Clinician: Referring Cypress Fanfan: Johny Drilling Treating Meiah Zamudio/Extender: Frann Rider in Treatment: 1 Vital Signs Time Taken: 03:15 Temperature (F): 97.5 Height (in): 63 Pulse (bpm): 68 Weight (lbs): 202 Respiratory Rate (breaths/min): 18 Body Mass Index (BMI): 35.8 Blood Pressure (mmHg): 177/74 Reference Range: 80 - 120 mg / dl Electronic Signature(s) Signed: 12/18/2016 4:01:21 PM By: Roger Shelter Entered By: Roger Shelter on 12/18/2016 15:17:43

## 2016-12-22 DIAGNOSIS — R0602 Shortness of breath: Secondary | ICD-10-CM | POA: Diagnosis not present

## 2016-12-28 ENCOUNTER — Encounter: Payer: PPO | Admitting: Surgery

## 2016-12-28 DIAGNOSIS — L89892 Pressure ulcer of other site, stage 2: Secondary | ICD-10-CM | POA: Diagnosis not present

## 2016-12-28 DIAGNOSIS — L97521 Non-pressure chronic ulcer of other part of left foot limited to breakdown of skin: Secondary | ICD-10-CM | POA: Diagnosis not present

## 2016-12-30 NOTE — Progress Notes (Signed)
Cynthia Terry, Cynthia Terry ([id number]) Visit Report for 12/28/2016 Chief Complaint Document Details Patient Name: JNYA, BROSSARD Date of Service: 12/28/2016 9:00 AM Medical Record Number: [id number] Patient Account Number: [id number] Date of Birth/Sex: 1924/12/03 (83 y.o. Female) Treating RN: Primary Care Provider: Johny Drilling Other Clinician: Referring Provider: Johny Drilling Treating Provider/Extender: Frann Rider in Treatment: 2 Information Obtained from: Patient Chief Complaint Patient seen for complaints of Non-Healing Wound to the left plantar metatarsal region and the right dorsum of her third toe which she's had for about a month Electronic Signature(s) Signed: 12/28/2016 10:05:03 AM By: Christin Fudge MD, FACS Entered By: Christin Fudge on 12/28/2016 10:05:03 Cynthia Terry ([id number]) -------------------------------------------------------------------------------- Debridement Details Patient Name: Cynthia Terry Date of Service: 12/28/2016 9:00 AM Medical Record Number: [id number] Patient Account Number: [id number] Date of Birth/Sex: August 29, 1924 (83 y.o. Female) Treating RN: Primary Care Provider: Johny Drilling Other Clinician: Referring Provider: Johny Drilling Treating Provider/Extender: Frann Rider in Treatment: 2 Debridement Performed for Wound #1 Left,Plantar Foot Assessment: Performed By: Physician Christin Fudge, MD Debridement: Debridement Pre-procedure Verification/Time Yes - 09:25 Out Taken: Start Time: 09:26 Pain Control: Other : lidocaine 4% Level: Skin/Subcutaneous Tissue Total Area Debrided (L x W): 0.8 (cm) x 1 (cm) = 0.8 (cm) Tissue and other material Viable, Non-Viable, Callus, Fibrin/Slough, Skin, Subcutaneous debrided: Instrument: Forceps, Scissors Bleeding: Minimum Hemostasis Achieved: Pressure End Time: 09:30 Procedural Pain: 0 Post Procedural Pain: 0 Response to Treatment: Procedure was tolerated well Post Debridement Measurements  of Total Wound Length: (cm) 0.8 Stage: Category/Stage II Width: (cm) 1 Depth: (cm) 0.2 Volume: (cm) 0.126 Character of Wound/Ulcer Post Stable Debridement: Post Procedure Diagnosis Same as Pre-procedure Electronic Signature(s) Signed: 12/28/2016 10:04:56 AM By: Christin Fudge MD, FACS Entered By: Christin Fudge on 12/28/2016 10:04:55 Cynthia Terry ([id number]) -------------------------------------------------------------------------------- HPI Details Patient Name: Cynthia Terry Date of Service: 12/28/2016 9:00 AM Medical Record Number: [id number] Patient Account Number: [id number] Date of Birth/Sex: 1924/07/24 (83 y.o. Female) Treating RN: Primary Care Provider: Johny Drilling Other Clinician: Referring Provider: Johny Drilling Treating Provider/Extender: Frann Rider in Treatment: 2 History of Present Illness Location: plantar aspect of the left first metatarsal head and the dorsum of the right third toe Quality: Patient reports No Pain. Severity: Patient states wound are getting worse. Duration: Patient has had the wound for < 4 weeks prior to presenting for treatment Context: The wound appeared gradually over time Modifying Factors: Other treatment(s) tried include:local care with Neosporin and hydrogen peroxide Associated Signs and Symptoms: Patient reports having:seen a podiatrist over a year or 2 ago and they said they could not do much for her feet HPI Description: 83 year old female referred by her PCP Dr. Johny Drilling, for a ulcer on the left foot which has been there for about a month. They noted a progressive increase in size of the ulceration had not been treating it with peroxide and Neosporin. She also has polymyalgia rheumatica and is on prednisone chronically. She has never been a smoker. Review of her electronic medical records does not reveal any recent x-ray of her foot nor is there any evidence of arterial studies done. the patient has seen podiatry  about 2 years ago and at that time they did not recommend anything specific for her feet and said there was no solution to the problems due to her deformity with gout. 12/18/2016 -- -- left foot x-ray -- IMPRESSION: Soft tissue wound and degenerative changes as described. No acute abnormality noted. Right foot x-ray -- IMPRESSION: Soft tissue changes  without acute abnormality. she is still awaiting an appointment from the podiatrist Dr. Elvina Mattes Electronic Signature(s) Signed: 12/28/2016 10:05:08 AM By: Christin Fudge MD, FACS Entered By: Christin Fudge on 12/28/2016 10:05:07 Cynthia Terry ([id number]) -------------------------------------------------------------------------------- Physical Exam Details Patient Name: Cynthia Terry Date of Service: 12/28/2016 9:00 AM Medical Record Number: [id number] Patient Account Number: [id number] Date of Birth/Sex: 02-09-1924 (83 y.o. Female) Treating RN: Primary Care Provider: Johny Drilling Other Clinician: Referring Provider: Johny Drilling Treating Provider/Extender: Frann Rider in Treatment: 2 Constitutional . Pulse regular. Respirations normal and unlabored. Afebrile. . Eyes Nonicteric. Reactive to light. Ears, Nose, Mouth, and Throat Lips, teeth, and gums WNL.Marland Kitchen Moist mucosa without lesions. Neck supple and nontender. No palpable supraclavicular or cervical adenopathy. Normal sized without goiter. Respiratory WNL. No retractions.. Cardiovascular Pedal Pulses WNL. No clubbing, cyanosis or edema. Lymphatic No adneopathy. No adenopathy. No adenopathy. Musculoskeletal Adexa without tenderness or enlargement.. Digits and nails w/o clubbing, cyanosis, infection, petechiae, ischemia, or inflammatory conditions.. Integumentary (Hair, Skin) No suspicious lesions. No crepitus or fluctuance. No peri-wound warmth or erythema. No masses.Marland Kitchen Psychiatric Judgement and insight Intact.. No evidence of depression, anxiety, or agitation.. Notes the  left foot plantar wound. Some sharp debridement with forceps and scissors and the wound was nicely saucerized. The right foot is looking good. Electronic Signature(s) Signed: 12/28/2016 10:06:02 AM By: Christin Fudge MD, FACS Entered By: Christin Fudge on 12/28/2016 10:06:00 Cynthia Terry ([id number]) -------------------------------------------------------------------------------- Physician Orders Details Patient Name: Cynthia Terry Date of Service: 12/28/2016 9:00 AM Medical Record Number: [id number] Patient Account Number: [id number] Date of Birth/Sex: [birth year] (83 y.o. Female) Treating RN: Cornell Barman Primary Care Provider: Johny Drilling Other Clinician: Referring Provider: Johny Drilling Treating Provider/Extender: Frann Rider in Treatment: 2 Verbal / Phone Orders: No Diagnosis Coding Wound Cleansing Wound #1 Left,Plantar Foot o Clean wound with Normal Saline. Wound #3 Right,Distal Toe Second o Clean wound with Normal Saline. Anesthetic Wound #1 Left,Plantar Foot o Topical Lidocaine 4% cream applied to wound bed prior to debridement Wound #3 Right,Distal Toe Second o Topical Lidocaine 4% cream applied to wound bed prior to debridement Primary Wound Dressing Wound #1 Left,Plantar Foot o Silvercel Non-Adherent Wound #3 Right,Distal Toe Second o Silvercel Non-Adherent Secondary Dressing Wound #1 Left,Plantar Foot o Dry Gauze Wound #3 Right,Distal Toe Second o Dry Gauze Dressing Change Frequency Wound #1 Left,Plantar Foot o Change dressing every other day. Wound #3 Right,Distal Toe Second o Change dressing every other day. Follow-up Appointments Wound #1 Left,Plantar Foot o Return Appointment in 1 week. Off-Loading Wound #1 Left,Plantar Foot o Other: - stay off foot as mush as possible. Cynthia Terry, Cynthia Terry ([id number]) Wound #3 Right,Distal Toe Second o Other: - stay off foot as mush as possible. Electronic Signature(s) Signed:  12/28/2016 4:36:05 PM By: Christin Fudge MD, FACS Signed: 12/28/2016 5:08:11 PM By: Gretta Cool BSN, RN, CWS, Kim RN, BSN Entered By: Gretta Cool, BSN, RN, CWS, Kim on 12/28/2016 09:31:46 Cynthia Terry, Cynthia Terry ([id number]) -------------------------------------------------------------------------------- Problem List Details Patient Name: Cynthia Terry Date of Service: 12/28/2016 9:00 AM Medical Record Number: [id number] Patient Account Number: [id number] Date of Birth/Sex: [birth year] (83 y.o. 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 Female) Treating RN: Primary Care Provider: Johny Drilling Other Clinician: Referring Provider: Johny Drilling Treating Provider/Extender: Frann Rider in Treatment: 2 Subjective Chief Complaint Information obtained from Patient Patient seen for complaints of Non-Healing Wound to the left plantar metatarsal region and the right dorsum of her third toe which she's had for  about a month History of Present Illness (HPI) The following HPI elements were documented for the patient's wound: Location: plantar aspect of the left first metatarsal head and the dorsum of the right third toe Quality: Patient reports No Pain. Severity: Patient states wound are getting worse. Duration: Patient has had the wound for < 4 weeks prior to presenting for treatment Context: The wound appeared gradually over time Modifying Factors: Other treatment(s) tried include:local care with Neosporin and hydrogen peroxide Associated Signs and Symptoms: Patient reports having:seen a podiatrist over a year or 2 ago and they said they could not do much for her feet 83 year old female referred by her PCP Dr. Johny Drilling, for a ulcer on the left foot which has been there for about a month. They noted a progressive increase in size of the ulceration had not been treating it with peroxide and Neosporin. She also has polymyalgia rheumatica and is on prednisone chronically. She has never been a smoker. Review of her electronic medical records does not reveal any recent x-ray of her foot nor is there any evidence of arterial studies done. the patient has seen podiatry about 2 years ago and at that time they did not recommend anything specific for her feet and said there was no solution to the problems due to her deformity with gout. 12/18/2016 -- -- left foot x-ray -- IMPRESSION: Soft tissue wound and degenerative changes as described. No acute abnormality noted. Right foot x-ray -- IMPRESSION: Soft tissue changes without acute abnormality. she is still awaiting an appointment from the podiatrist Dr. Elvina Mattes Patient History Information obtained from Patient. Family History Cancer - Father, Heart Disease - Mother, Hypertension - Mother,Siblings, No family history of Diabetes, Kidney Disease, Lung Disease, Seizures, Stroke, Thyroid Problems, Tuberculosis. Social History Never smoker, Marital Status  - Widowed, Alcohol Use - Never, Drug Use - No History, Caffeine Use - Rarely. Cynthia Terry, Cynthia Terry ([id number]) Objective Constitutional Pulse regular. Respirations normal and unlabored. Afebrile. Vitals Time Taken: 9:06 AM, Height: 63 in, Weight: 202 lbs, BMI: 35.8, Temperature: 97.9 F, Pulse: 69 bpm, Respiratory Rate: 18 breaths/min, Blood Pressure: 163/75 mmHg, Pulse Oximetry: 97 %. Eyes Nonicteric. Reactive to light. Ears, Nose, Mouth, and Throat Lips, teeth, and gums WNL.Marland Kitchen Moist mucosa without lesions. Neck supple and nontender. No palpable supraclavicular or cervical adenopathy. Normal sized without goiter. Respiratory WNL. No retractions.. Cardiovascular Pedal Pulses WNL. No clubbing, cyanosis or edema. Lymphatic No adneopathy. No adenopathy. No adenopathy. Musculoskeletal Adexa without tenderness or enlargement.. Digits and nails w/o clubbing, cyanosis, infection, petechiae, ischemia, or inflammatory conditions.Marland Kitchen Psychiatric Judgement and insight Intact.. No evidence of depression, anxiety, or agitation.. General Notes: the left foot plantar wound. Some sharp debridement with forceps and scissors and the wound was nicely saucerized. The right foot is  looking good. Integumentary (Hair, Skin) No suspicious lesions. No crepitus or fluctuance. No peri-wound warmth or erythema. No masses.. Wound #1 status is Open. Original cause of wound was Gradually Appeared. The wound is located on the [place]. The wound measures 0.6cm length x 0.8cm width x 0.1cm depth; 0.377cm^2 area and 0.038cm^3 volume. There is Fat Layer (Subcutaneous Tissue) Exposed exposed. There is a medium amount of serous drainage noted. The wound margin is flat and intact. There is medium (34-66%) pink granulation within the wound bed. There is a medium (34-66%) amount of necrotic tissue within the wound bed including Adherent Slough. The periwound skin appearance exhibited: Callus, Maceration. The periwound  skin appearance did not exhibit: Crepitus, Excoriation, Induration, Rash, Scarring, Dry/Scaly, Atrophie Blanche, Cyanosis, Ecchymosis, Hemosiderin Staining, Mottled, Pallor, Rubor, Erythema. Periwound temperature was noted as No Abnormality. Wound #2 status is Healed - Epithelialized. Original cause of wound was Gradually Appeared. The wound is located on the Right,Dorsal Foot. The wound measures 0cm length x 0cm width x 0cm depth; 0cm^2 area and 0cm^3 volume. There is a medium amount of serosanguineous drainage noted. The wound margin is flat and intact. There is no granulation within the Cynthia Terry, Cynthia S. ([id number]) wound bed. There is no necrotic tissue within the wound bed. The periwound skin appearance did not exhibit: Callus, Crepitus, Excoriation, Induration, Rash, Scarring, Dry/Scaly, Maceration, Atrophie Blanche, Cyanosis, Ecchymosis, Hemosiderin Staining, Mottled, Pallor, Rubor, Erythema. Wound #3 status is Open. Original cause of wound was Gradually Appeared. The wound is located on the Right,Distal Toe Second. The wound measures 0.8cm length x 0.9cm width x 0.1cm depth; 0.565cm^2 area and 0.057cm^3 volume. There is no tunneling or undermining noted. There is a small amount of serous drainage noted. The wound margin is flat and intact. There is no granulation within the wound bed. There is a large (67-100%) amount of necrotic tissue within the wound bed including Eschar. The periwound skin appearance did not exhibit: Callus, Crepitus, Excoriation, Induration, Rash, Scarring, Dry/Scaly, Maceration, Atrophie Blanche, Cyanosis, Ecchymosis, Hemosiderin Staining, Mottled, Pallor, Rubor, Erythema. 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 There was a Skin/Subcutaneous Tissue Debridement ([id number]) debridement with total area of 0.8 sq cm performed by Christin Fudge, MD. with the following instrument(s): Forceps and Scissors to remove Viable and Non-Viable tissue/material including Fibrin/Slough, Skin, Callus, and Subcutaneous after achieving pain control using Other (lidocaine 4%). A time out was conducted at 09:25, prior to the start of the procedure. A Minimum amount of bleeding was controlled with Pressure. The procedure was tolerated well with a pain level of 0 throughout and a pain level of 0 following the procedure. Post Debridement Measurements: 0.8cm length x 1cm width x 0.2cm depth; 0.126cm^3 volume. Post debridement Stage noted as Category/Stage II. Character of Wound/Ulcer Post Debridement is stable. Post procedure Diagnosis Wound #1: Same as Pre-Procedure Plan Wound Cleansing: Wound #1 Left,Plantar Foot: Clean wound with Normal Saline. Wound #3 Right,Distal Toe Second: Clean wound with Normal Saline. Anesthetic: Wound #1 Left,Plantar Foot: Topical Lidocaine 4% cream applied to wound bed prior to debridement Wound #3 Right,Distal Toe SecondCLARISSE, Cynthia Terry. ([id number]) Topical Lidocaine 4% cream applied to  wound bed prior to debridement Primary Wound Dressing: Wound #1 Left,Plantar Foot: Silvercel Non-Adherent Wound #3 Right,Distal Toe Second: Silvercel Non-Adherent Secondary Dressing: Wound #1 Left,Plantar Foot: Dry Gauze Wound #3 Right,Distal Toe Second: Dry Gauze Dressing Change Frequency: Wound #1 Left,Plantar Foot: Change dressing every other day. Wound #3 Right,Distal Toe Second: Change dressing every other day. Follow-up Appointments: Wound #1 Left,Plantar Foot: Return Appointment in 1 week. Off-Loading: Wound #1 Left,Plantar Foot: Other: - stay off foot as mush  as possible. Wound #3 Right,Distal Toe Second: Other: - stay off foot as mush as possible. her appointment with the podiatrist as this coming Monday and we are awaiting his opinion. After review today and some sharp debridement on the left plantar aspect of her foot I have recommended: 1. Silver alginate and an offloading felt on the plantar aspect of the left foot and also some silver alginate on the dorsum of the right third toe. 2. Offloading has been discussed with her in great detail 3. Adequate protein, Vitamin A, vitamin C and zinc. 4. I have asked her to see our podiatry group again to reevaluate regarding options of treating her deformity and also the possibility of getting her orthotic shoes made -- appointment still pending 5. regular visits to the wound center Electronic Signature(s) Signed: 12/28/2016 10:06:59 AM By: Christin Fudge MD, FACS Entered By: Christin Fudge on 12/28/2016 10:06:59 Cynthia Terry ([id number]) -------------------------------------------------------------------------------- ROS/PFSH Details Patient Name: Cynthia Terry Date of Service: 12/28/2016 9:00 AM Medical Record Number: [id number] Patient Account Number: [id number] Date of Birth/Sex: 06-27-1924 (83 y.o. Female) Treating RN: Primary Care Provider: Johny Drilling Other Clinician: Referring Provider: Johny Drilling Treating Provider/Extender: Frann Rider in Treatment: 2 Information Obtained From Patient Wound History Do you currently have one or more open woundso Yes How many open wounds do you currently haveo 3 Approximately how long have you had your woundso 3 to 4 weeks How have you been treating your wound(s) until nowo cleaning, peroxide, neosporin and dressing Has your wound(s) ever healed and then re-openedo No Have you had any lab work done in the past montho No Have you tested positive for an antibiotic resistant organism (MRSA, No VRE)o Have you tested positive for  osteomyelitis (bone infection)o No Have you had any tests for circulation on your legso No Eyes Medical History: Positive for: Cataracts Negative for: Glaucoma; Optic Neuritis Ear/Nose/Mouth/Throat Medical History: Negative for: Middle ear problems Hematologic/Lymphatic Medical History: Negative for: Anemia; Hemophilia; Human Immunodeficiency Virus; Lymphedema; Sickle Cell Disease Respiratory Medical History: Positive for: Chronic Obstructive Pulmonary Disease (COPD) Negative for: Aspiration; Asthma; Pneumothorax; Sleep Apnea; Tuberculosis Cardiovascular Medical History: Positive for: Congestive Heart Failure; Hypertension Negative for: Angina; Arrhythmia; Coronary Artery Disease; Deep Vein Thrombosis; Myocardial Infarction; Peripheral Arterial Disease; Peripheral Venous Disease; Phlebitis; Vasculitis Gastrointestinal Medical History: Negative for: Cirrhosis ; Colitis; Crohnos; Hepatitis A; Hepatitis B; Hepatitis C Cynthia Terry, Cynthia S. ([id number]) Endocrine Medical History: Negative for: Type I Diabetes; Type II Diabetes Genitourinary Medical History: Negative for: End Stage Renal Disease Immunological Medical History: Negative for: Lupus Erythematosus; Raynaudos; Scleroderma Integumentary (Skin) Medical History: Negative for: History of Burn; History of pressure wounds Musculoskeletal Medical History: Positive for: Gout; Rheumatoid Arthritis Negative for: Osteoarthritis; Osteomyelitis Neurologic Medical History: Negative for: Dementia; Neuropathy; Quadriplegia; Paraplegia; Seizure Disorder Oncologic Medical History: Negative for: Received Chemotherapy; Received Radiation Psychiatric Medical History: Negative for: Anorexia/bulimia; Confinement Anxiety HBO Extended History Items Eyes: Cataracts Immunizations Pneumococcal Vaccine: Received Pneumococcal Vaccination: Yes Tetanus Vaccine: Last tetanus shot: 12/12/2014 Implantable Devices Family and Social  History Cancer: Yes - Father; Diabetes: No; Heart Disease: Yes - Mother; Hypertension: Yes - Mother,Siblings; Kidney Disease: No; Lung Disease: No; Seizures: No; Stroke: No; Thyroid Problems: No; Tuberculosis: No; Never smoker; Marital Status - Widowed; Alcohol Use: Never; Drug Use: No History; Caffeine Use: Rarely; Financial Concerns: No; Food, Clothing or Shelter Needs: No; Support System Lacking: No; Transportation Concerns: No; Advanced Directives: Yes (Not Provided); Patient does not want information on Advanced Directives; Do not resuscitate: No; Living Will: Yes (Not Provided) Cynthia Terry, Cynthia Terry ([id number]) Physician Affirmation I have reviewed and agree with the above information. Electronic Signature(s) Signed: 12/28/2016 4:36:05 PM By: Christin Fudge MD, FACS Entered By: Christin Fudge on 12/28/2016 10:05:17 Cynthia Terry ([id number]) -------------------------------------------------------------------------------- SuperBill Details Patient Name: Cynthia Terry Date of Service: 12/28/2016 Medical Record Number: [id number] Patient Account Number: [id number] Date of Birth/Sex: 1924/10/22 (83 y.o. 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 M10.072 Idiopathic gout, left ankle and foot M10.071 Idiopathic gout, right ankle and foot Facility Procedures CPT4 Code Description: [id number] 11042 - DEB SUBQ TISSUE 20 SQ CM/< ICD-10 Diagnosis Description L97.521 Non-pressure chronic ulcer of other part of left foot limited t L97.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 M10.072  Idiopathic gout, left ankle and foot M10.071  Idiopathic gout, right ankle and foot Modifier: o breakdown of sk layer exposed Quantity: 1 in Physician Procedures CPT4 Code Description: [id number] 11042 - WC PHYS SUBQ TISS 20 SQ CM ICD-10 Diagnosis Description L97.521 Non-pressure chronic ulcer of other part of left foot limited t L97.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 M10.072  Idiopathic gout, left ankle and foot M10.071 Idiopathic gout, right ankle and foot Modifier: o breakdown of sk layer exposed Quantity: 1 in Electronic Signature(s) Signed: 12/28/2016 10:07:12 AM By: Christin Fudge MD, FACS Entered By: Christin Fudge on 12/28/2016 10:07:11

## 2016-12-30 NOTE — Progress Notes (Addendum)
Cynthia Terry, Cynthia Terry ([id number]) Visit Report for 12/28/2016 Arrival Information Details Patient Name: Cynthia Terry, Cynthia Terry Date of Service: 12/28/2016 9:00 AM Medical Record Number: [id number] Patient Account Number: [id number] Date of Birth/Sex: [birth year] (83 y.o. Female) Treating RN: Cornell Barman Primary Care Tilford Deaton: Johny Drilling Other Clinician: Referring Caroleena Paolini: Johny Drilling Treating Vyctoria Dickman/Extender: Frann Rider in Treatment: 2 Visit Information History Since Last Visit Added or deleted any medications: No Patient Arrived: Wheel Chair Any new allergies or adverse reactions: No Arrival Time: 08:59 Had a fall or experienced change in No activities of daily living that may affect Accompanied By: family risk of falls: Transfer Assistance: Manual Signs or symptoms of abuse/neglect since last visito No Patient Identification Verified: Yes Hospitalized since last visit: No Secondary Verification Process Completed: Yes Has Dressing in Place as Prescribed: Yes Patient Requires Transmission-Based No Pain Present Now: No Precautions: Patient Has Alerts: No Electronic Signature(s) Signed: 12/28/2016 5:08:11 PM By: Gretta Cool, BSN, RN, CWS, Kim RN, BSN Entered By: Gretta Cool, BSN, RN, CWS, Kim on 12/28/2016 09:06:17 Cynthia Terry ([id number]) -------------------------------------------------------------------------------- Encounter Discharge Information Details Patient Name: Cynthia Terry Date of Service: 12/28/2016 9:00 AM Medical Record Number: [id number] Patient Account Number: [id number] Date of Birth/Sex: 04-Jul-1924 (83 y.o. Female) Treating RN: Cornell Barman Primary Care Jaylen Knope: Johny Drilling Other Clinician: Referring Keyna Blizard: Johny Drilling Treating Deforest Maiden/Extender: Frann Rider in Treatment: 2 Encounter Discharge Information Items Discharge Pain Level: 0 Discharge Condition: Stable Ambulatory Status: Wheelchair Discharge Destination: Home Transportation: Private  Auto Accompanied By: =family Schedule Follow-up Appointment: Yes Medication Reconciliation completed and Yes provided to Patient/Care Abhiram Criado: Provided on Clinical Summary of Care: 12/28/2016 Form Type Recipient Paper Patient BI Electronic Signature(s) Signed: 12/28/2016 9:42:55 AM By: Sharon Mt Entered By: Sharon Mt on 12/28/2016 09:42:55 Cynthia Terry ([id number]) -------------------------------------------------------------------------------- Lower Extremity Assessment Details Patient Name: Cynthia Terry Date of Service: 12/28/2016 9:00 AM Medical Record Number: [id number] Patient Account Number: [id number] Date of Birth/Sex: [birth year] (83 y.o. Female) Treating RN: Cornell Barman Primary Care Hali Balgobin: Johny Drilling Other Clinician: Referring Lenzie Sandler: Johny Drilling Treating Alizza Sacra/Extender: Frann Rider in Treatment: 2 Vascular Assessment Pulses: Dorsalis Pedis Palpable: [Left:Yes] [Right:Yes] Posterior Tibial Extremity colors, hair growth, and conditions: Extremity Color: [Left:Normal] [Right:Normal] Hair Growth on Extremity: [Left:No] [Right:No] Temperature of Extremity: [Left:Warm] [Right:Warm] Capillary Refill: [Left:< 3 seconds] [Right:< 3 seconds] Toe Nail Assessment Left: Right: Thick: No No Discolored: No No Deformed: No No Improper Length and Hygiene: No No Electronic Signature(s) Signed: 12/28/2016 5:08:11 PM By: Gretta Cool, BSN, RN, CWS, Kim RN, BSN Entered By: Gretta Cool, BSN, RN, CWS, Kim on 12/28/2016 09:16:50 Cynthia Terry ([id number]) -------------------------------------------------------------------------------- Multi Wound Chart Details Patient Name: Cynthia Terry Date of Service: 12/28/2016 9:00 AM Medical Record Number: [id number] Patient Account Number: [id number] Date of Birth/Sex: 26-Sep-1924 (83 y.o. Female) Treating RN: Cornell Barman Primary Care Nishaan Stanke: Johny Drilling Other Clinician: Referring Thoams Siefert: Johny Drilling Treating  Jaslyn Bansal/Extender: Frann Rider in Treatment: 2 Vital Signs Height(in): 24 Pulse(bpm): 16 Weight(lbs): 202 Blood Pressure(mmHg): 163/75 Body Mass Index(BMI): 36 Temperature(F): 97.9 Respiratory Rate 18 (breaths/min): Photos: Wound Location: Left Foot - Plantar Right Foot - Dorsal Right Toe Second - Distal Wounding Event: Gradually Appeared Gradually Appeared Gradually Appeared Primary Etiology: Pressure Ulcer Pressure Ulcer Pressure Ulcer Comorbid History: Cataracts, Chronic Obstructive Cataracts, Chronic Obstructive Cataracts, Chronic Obstructive Pulmonary Disease (COPD), Pulmonary Disease (COPD), Pulmonary Disease (COPD), Congestive Heart Failure, Congestive Heart Failure, Congestive Heart Failure, Hypertension, Gout, Hypertension, Gout, Hypertension, Gout, Rheumatoid Arthritis Rheumatoid Arthritis Rheumatoid Arthritis Date Acquired: 11/03/2016 11/03/2016 11/30/2016  Weeks of Treatment: 2 2 0 Wound Status: Open Healed - Epithelialized Open Measurements L x W x D 0.6x0.8x0.1 0x0x0 0.8x0.9x0.1 (cm) Area (cm) : 0.377 0 0.565 Volume (cm) : 0.038 0 0.057 % Reduction in Area: 60.00% 100.00% N/A % Reduction in Volume: 79.80% 100.00% N/A Classification: Category/Stage II Category/Stage II Category/Stage II Exudate Amount: Medium Medium Small Exudate Type: Serous Serosanguineous Serous Exudate Color: amber red, brown amber Wound Margin: Flat and Intact Flat and Intact Flat and Intact Granulation Amount: Medium (34-66%) None Present (0%) None Present (0%) Granulation Quality: Pink N/A N/A Necrotic Amount: Medium (34-66%) None Present (0%) Large (67-100%) Necrotic Tissue: Adherent Slough N/A Eschar Exposed Structures: Fat Layer (Subcutaneous Fascia: No Fascia: No Tissue) Exposed: Yes Fat Layer (Subcutaneous Fat Layer (Subcutaneous Fascia: No Tissue) Exposed: No Tissue) Exposed: No Tendon: No Tendon: No Tendon: No Muscle: No Muscle: No Muscle: No Cynthia Terry, Cynthia S.  ([id number]) Joint: No Joint: No Joint: No Bone: No Bone: No Bone: No Epithelialization: None Large (67-100%) None Debridement: Debridement ([id number]) N/A N/A Pre-procedure 09:25 N/A N/A Verification/Time Out Taken: Pain Control: Other N/A N/A Tissue Debrided: Fibrin/Slough, Skin, Callus, N/A N/A Subcutaneous Level: Skin/Subcutaneous Tissue N/A N/A Debridement Area (sq cm): 0.8 N/A N/A Instrument: Forceps, Scissors N/A N/A Bleeding: Minimum N/A N/A Hemostasis Achieved: Pressure N/A N/A Procedural Pain: 0 N/A N/A Post Procedural Pain: 0 N/A N/A Debridement Treatment Procedure was tolerated well N/A N/A Response: Post Debridement 0.8x1x0.2 N/A N/A Measurements L x W x D (cm) Post Debridement Volume: 0.126 N/A N/A (cm) Post Debridement Stage: Category/Stage II N/A N/A Periwound Skin Texture: Callus: Yes Excoriation: No Excoriation: No Excoriation: No Induration: No Induration: No Induration: No Callus: No Callus: No Crepitus: No Crepitus: No Crepitus: No Rash: No Rash: No Rash: No Scarring: No Scarring: No Scarring: No Periwound Skin Moisture: Maceration: Yes Maceration: No Maceration: No Dry/Scaly: No Dry/Scaly: No Dry/Scaly: No Periwound Skin Color: Atrophie Blanche: No Atrophie Blanche: No Atrophie Blanche: No Cyanosis: No Cyanosis: No Cyanosis: No Ecchymosis: No Ecchymosis: No Ecchymosis: No Erythema: No Erythema: No Erythema: No Hemosiderin Staining: No Hemosiderin Staining: No Hemosiderin Staining: No Mottled: No Mottled: No Mottled: No Pallor: No Pallor: No Pallor: No Rubor: No Rubor: No Rubor: No Temperature: No Abnormality N/A N/A Tenderness on Palpation: No No No Wound Preparation: Ulcer Cleansing: Ulcer Cleansing: Ulcer Cleansing: Rinsed/Irrigated with Saline Rinsed/Irrigated with Saline Rinsed/Irrigated with Saline Topical Anesthetic Applied: Topical Anesthetic Applied: Topical Anesthetic Applied: Other: lidocaine 4  % None Other: idocaine 4% Procedures Performed: Debridement N/A N/A Treatment Notes Wound #1 (Left, Plantar Foot) 1. Cleansed with: Clean wound with Normal Saline 2. Anesthetic Topical Lidocaine 4% cream to wound bed prior to debridement 4. Dressing Applied: Other dressing (specify in notes) Cynthia Terry, Cynthia S. ([id number]) 5. Secondary Dressing Applied Dry Gauze Notes Silvercell, kerlix and conform Wound #3 (Right, Distal Toe Second) 1. Cleansed with: Clean wound with Normal Saline 2. Anesthetic Topical Lidocaine 4% cream to wound bed prior to debridement 4. Dressing Applied: Other dressing (specify in notes) 5. Secondary Dressing Applied Dry Gauze Notes Silvercell, kerlix and conform Electronic Signature(s) Signed: 12/28/2016 10:04:47 AM By: Christin Fudge MD, FACS Entered By: Christin Fudge on 12/28/2016 10:04:47 Cynthia Terry ([id number]) -------------------------------------------------------------------------------- [place] Details Patient Name: Cynthia Terry Date of Service: 12/28/2016 9:00 AM Medical Record Number: [id number] Patient Account Number: [id number] Date of Birth/Sex: 15-Sep-1924 (83 y.o. Female) Treating RN: Cornell Barman Primary Care Kellina Dreese: Johny Drilling Other Clinician: Referring Mikaylah Libbey: Johny Drilling Treating Taylon Coole/Extender: Frann Rider  in Treatment: 2 Active Inactive Electronic Signature(s) Signed: 01/04/2017 8:37:46 AM By: Gretta Cool, BSN, RN, CWS, Kim RN, BSN Previous Signature: 12/28/2016 5:08:11 PM Version By: Gretta Cool, BSN, RN, CWS, Kim RN, BSN Entered By: Gretta Cool, BSN, RN, CWS, Kim on 01/04/2017 08:37:45 Cynthia Terry ([id number]) -------------------------------------------------------------------------------- Pain Assessment Details Patient Name: Cynthia Terry Date of Service: 12/28/2016 9:00 AM Medical Record Number: [id number] Patient Account Number: [id number] Date of Birth/Sex: [birth year] (83 y.o.  Female) Treating RN: Cornell Barman Primary Care Starlet Gallentine: Johny Drilling Other Clinician: Referring Salle Brandle: Johny Drilling Treating Asusena Sigley/Extender: Frann Rider in Treatment: 2 Active Problems Location of Pain Severity and Description of Pain Patient Has Paino No Site Locations With Dressing Change: No Pain Management and Medication Current Pain Management: Electronic Signature(s) Signed: 12/28/2016 5:08:11 PM By: Gretta Cool, BSN, RN, CWS, Kim RN, BSN Entered By: Gretta Cool, BSN, RN, CWS, Kim on 12/28/2016 22:02:54 Cynthia Terry ([id number]) -------------------------------------------------------------------------------- Patient/Caregiver Education Details Patient Name: Cynthia Terry Date of Service: 12/28/2016 9:00 AM Medical Record Number: [id number] Patient Account Number: [id number] Date of Birth/Gender: 1924-08-03 (83 y.o. Female) Treating RN: Cornell Barman Primary Care Physician: Johny Drilling Other Clinician: Referring Physician: Johny Drilling Treating Physician/Extender: Frann Rider in Treatment: 2 Education Assessment Education Provided To: Patient Education Topics Provided Wound/Skin Impairment: Handouts: Caring for Your Ulcer, Other: continue wound care as prescribed Methods: Demonstration Responses: State content correctly Electronic Signature(s) Signed: 12/28/2016 5:08:11 PM By: Gretta Cool, BSN, RN, CWS, Kim RN, BSN Entered By: Gretta Cool, BSN, RN, CWS, Kim on 12/28/2016 09:33:30 Cynthia Terry ([id number]) -------------------------------------------------------------------------------- Wound Assessment Details Patient Name: Cynthia Terry Date of Service: 12/28/2016 9:00 AM Medical Record Number: [id number] Patient Account Number: [id number] Date of Birth/Sex: 06-Dec-1924 (83 y.o. Female) Treating RN: Cornell Barman Primary Care Jaeven Wanzer: Johny Drilling Other Clinician: Referring Kasiya Burck: Johny Drilling Treating Eliane Hammersmith/Extender: Frann Rider in Treatment:  2 Wound Status Wound Number: 1 Primary Pressure Ulcer Etiology: Wound Location: Left Foot - Plantar Wound Open Wounding Event: Gradually Appeared Status: Date Acquired: 11/03/2016 Comorbid Cataracts, Chronic Obstructive Pulmonary Weeks Of Treatment: 2 History: Disease (COPD), Congestive Heart Failure, Clustered Wound: No Hypertension, Gout, Rheumatoid Arthritis Photos Wound Measurements Length: (cm) 0.6 Width: (cm) 0.8 Depth: (cm) 0.1 Area: (cm) 0.377 Volume: (cm) 0.038 % Reduction in Area: 60% % Reduction in Volume: 79.8% Epithelialization: None Wound Description Classification: Category/Stage II Wound Margin: Flat and Intact Exudate Amount: Medium Exudate Type: Serous Exudate Color: amber Foul Odor After Cleansing: No Slough/Fibrino Yes Wound Bed Granulation Amount: Medium (34-66%) Exposed Structure Granulation Quality: Pink Fascia Exposed: No Necrotic Amount: Medium (34-66%) Fat Layer (Subcutaneous Tissue) Exposed: Yes Necrotic Quality: Adherent Slough Tendon Exposed: No Muscle Exposed: No Joint Exposed: No Bone Exposed: No Periwound Skin Texture Texture Color No Abnormalities Noted: No No Abnormalities Noted: No Callus: Yes Atrophie Blanche: No Cynthia Terry, RACZ. ([id number]) Crepitus: No Cyanosis: No Excoriation: No Ecchymosis: No Induration: No Erythema: No Rash: No Hemosiderin Staining: No Scarring: No Mottled: No Pallor: No Moisture Rubor: No No Abnormalities Noted: No Dry / Scaly: No Temperature / Pain Maceration: Yes Temperature: No Abnormality Wound Preparation Ulcer Cleansing: Rinsed/Irrigated with Saline Topical Anesthetic Applied: Other: lidocaine 4 %, Electronic Signature(s) Signed: 12/28/2016 5:08:11 PM By: Gretta Cool, BSN, RN, CWS, Kim RN, BSN Entered By: Gretta Cool, BSN, RN, CWS, Kim on 12/28/2016 09:15:00 Cynthia Terry ([id number]) -------------------------------------------------------------------------------- Wound Assessment  Details Patient Name: Cynthia Terry Date of Service: 12/28/2016 9:00 AM Medical Record Number: [id number] Patient Account Number: [id number] Date of Birth/Sex: November 11, 1924 (83 y.o. Female) Treating  RN: Cornell Barman Primary Care Tilak Oakley: Johny Drilling Other Clinician: Referring Cassadee Vanzandt: Johny Drilling Treating Cillian Gwinner/Extender: Frann Rider in Treatment: 2 Wound Status Wound Number: 2 Primary Pressure Ulcer Etiology: Wound Location: Right Foot - Dorsal Wound Healed - Epithelialized Wounding Event: Gradually Appeared Status: Date Acquired: 11/03/2016 Comorbid Cataracts, Chronic Obstructive Pulmonary Weeks Of Treatment: 2 History: Disease (COPD), Congestive Heart Failure, Clustered Wound: No Hypertension, Gout, Rheumatoid Arthritis Photos Wound Measurements Length: (cm) 0 % Reduc Width: (cm) 0 % Reduc Depth: (cm) 0 Epithel Area: (cm) 0 Volume: (cm) 0 tion in Area: 100% tion in Volume: 100% ialization: Large (67-100%) Wound Description Classification: Category/Stage II Foul Od Wound Margin: Flat and Intact Slough/ Exudate Amount: Medium Exudate Type: Serosanguineous Exudate Color: red, brown or After Cleansing: No Fibrino Yes Wound Bed Granulation Amount: None Present (0%) Exposed Structure Necrotic Amount: None Present (0%) Fascia Exposed: No Fat Layer (Subcutaneous Tissue) Exposed: No Tendon Exposed: No Muscle Exposed: No Joint Exposed: No Bone Exposed: No Periwound Skin Texture Texture Color No Abnormalities Noted: No No Abnormalities Noted: No Callus: No Atrophie Blanche: No Cynthia Terry, Cynthia Terry ([id number]) Crepitus: No Cyanosis: No Excoriation: No Ecchymosis: No Induration: No Erythema: No Rash: No Hemosiderin Staining: No Scarring: No Mottled: No Pallor: No Moisture Rubor: No No Abnormalities Noted: No Dry / Scaly: No Maceration: No Wound Preparation Ulcer Cleansing: Rinsed/Irrigated with Saline Topical Anesthetic Applied:  None Electronic Signature(s) Signed: 12/28/2016 5:08:11 PM By: Gretta Cool, BSN, RN, CWS, Kim RN, BSN Entered By: Gretta Cool, BSN, RN, CWS, Kim on 12/28/2016 09:15:42 Cynthia Terry ([id number]) -------------------------------------------------------------------------------- Wound Assessment Details Patient Name: Cynthia Terry Date of Service: 12/28/2016 9:00 AM Medical Record Number: [id number] Patient Account Number: [id number] Date of Birth/Sex: [birth year] (83 y.o. Female) Treating RN: Cornell Barman Primary Care Burhanuddin Kohlmann: Johny Drilling Other Clinician: Referring Brystol Wasilewski: Johny Drilling Treating Leory Allinson/Extender: Frann Rider in Treatment: 2 Wound Status Wound Number: 3 Primary Pressure Ulcer Etiology: Wound Location: Right Toe Second - Distal Wound Open Wounding Event: Gradually Appeared Status: Date Acquired: 11/30/2016 Comorbid Cataracts, Chronic Obstructive Pulmonary Weeks Of Treatment: 0 History: Disease (COPD), Congestive Heart Failure, Clustered Wound: No Hypertension, Gout, Rheumatoid Arthritis Photos Wound Measurements Length: (cm) 0.8 Width: (cm) 0.9 Depth: (cm) 0.1 Area: (cm) 0.565 Volume: (cm) 0.057 % Reduction in Area: % Reduction in Volume: Epithelialization: None Tunneling: No Undermining: No Wound Description Classification: Category/Stage II Wound Margin: Flat and Intact Exudate Amount: Small Exudate Type: Serous Exudate Color: amber Foul Odor After Cleansing: No Slough/Fibrino No Wound Bed Granulation Amount: None Present (0%) Exposed Structure Necrotic Amount: Large (67-100%) Fascia Exposed: No Necrotic Quality: Eschar Fat Layer (Subcutaneous Tissue) Exposed: No Tendon Exposed: No Muscle Exposed: No Joint Exposed: No Bone Exposed: No Periwound Skin Texture Texture Color No Abnormalities Noted: No No Abnormalities Noted: No Callus: No Atrophie Blanche: No Cynthia Terry, Cynthia Terry. ([id number]) Crepitus: No Cyanosis: No Excoriation:  No Ecchymosis: No Induration: No Erythema: No Rash: No Hemosiderin Staining: No Scarring: No Mottled: No Pallor: No Moisture Rubor: No No Abnormalities Noted: No Dry / Scaly: No Maceration: No Wound Preparation Ulcer Cleansing: Rinsed/Irrigated with Saline Topical Anesthetic Applied: Other: idocaine 4%, Electronic Signature(s) Signed: 12/28/2016 5:08:11 PM By: Gretta Cool, BSN, RN, CWS, Kim RN, BSN Entered By: Gretta Cool, BSN, RN, CWS, Kim on 12/28/2016 09:20:02 Cynthia Terry ([id number]) -------------------------------------------------------------------------------- Vitals Details Patient Name: Cynthia Terry Date of Service: 12/28/2016 9:00 AM Medical Record Number: [id number] Patient Account Number: [id number] Date of Birth/Sex: 1924/09/15 (83 y.o. Female) Treating RN: Cornell Barman Primary Care Kashay Cavenaugh: Kym Groom,  Freida Busman Other Clinician: Referring Milarose Savich: Johny Drilling Treating Katira Dumais/Extender: Frann Rider in Treatment: 2 Vital Signs Time Taken: 09:06 Temperature (F): 97.9 Height (in): 63 Pulse (bpm): 69 Weight (lbs): 202 Respiratory Rate (breaths/min): 18 Body Mass Index (BMI): 35.8 Blood Pressure (mmHg): 163/75 Reference Range: 80 - 120 mg / dl Pulse Oximetry (%): 97 Electronic Signature(s) Signed: 12/28/2016 5:08:11 PM By: Gretta Cool, BSN, RN, CWS, Kim RN, BSN Entered By: Gretta Cool, BSN, RN, CWS, Kim on 12/28/2016 09:06:54

## 2017-01-02 DIAGNOSIS — Q661 Congenital talipes calcaneovarus: Secondary | ICD-10-CM | POA: Diagnosis not present

## 2017-01-02 DIAGNOSIS — L97521 Non-pressure chronic ulcer of other part of left foot limited to breakdown of skin: Secondary | ICD-10-CM | POA: Diagnosis not present

## 2017-01-02 DIAGNOSIS — M2032 Hallux varus (acquired), left foot: Secondary | ICD-10-CM | POA: Diagnosis not present

## 2017-01-02 DIAGNOSIS — G609 Hereditary and idiopathic neuropathy, unspecified: Secondary | ICD-10-CM | POA: Diagnosis not present

## 2017-01-08 ENCOUNTER — Ambulatory Visit: Payer: PPO | Admitting: Surgery

## 2017-01-10 DIAGNOSIS — M2032 Hallux varus (acquired), left foot: Secondary | ICD-10-CM | POA: Diagnosis not present

## 2017-01-10 DIAGNOSIS — Q661 Congenital talipes calcaneovarus: Secondary | ICD-10-CM | POA: Diagnosis not present

## 2017-01-10 DIAGNOSIS — L97521 Non-pressure chronic ulcer of other part of left foot limited to breakdown of skin: Secondary | ICD-10-CM | POA: Diagnosis not present

## 2017-01-21 DIAGNOSIS — R0602 Shortness of breath: Secondary | ICD-10-CM | POA: Diagnosis not present

## 2017-01-31 DIAGNOSIS — L97521 Non-pressure chronic ulcer of other part of left foot limited to breakdown of skin: Secondary | ICD-10-CM | POA: Diagnosis not present

## 2017-02-07 DIAGNOSIS — L02612 Cutaneous abscess of left foot: Secondary | ICD-10-CM | POA: Diagnosis not present

## 2017-02-14 DIAGNOSIS — M2032 Hallux varus (acquired), left foot: Secondary | ICD-10-CM | POA: Diagnosis not present

## 2017-02-14 DIAGNOSIS — Q661 Congenital talipes calcaneovarus: Secondary | ICD-10-CM | POA: Diagnosis not present

## 2017-02-14 DIAGNOSIS — L97521 Non-pressure chronic ulcer of other part of left foot limited to breakdown of skin: Secondary | ICD-10-CM | POA: Diagnosis not present

## 2017-02-14 DIAGNOSIS — G609 Hereditary and idiopathic neuropathy, unspecified: Secondary | ICD-10-CM | POA: Diagnosis not present

## 2017-02-21 DIAGNOSIS — R0602 Shortness of breath: Secondary | ICD-10-CM | POA: Diagnosis not present

## 2017-02-27 DIAGNOSIS — F419 Anxiety disorder, unspecified: Secondary | ICD-10-CM | POA: Diagnosis not present

## 2017-02-27 DIAGNOSIS — R531 Weakness: Secondary | ICD-10-CM | POA: Diagnosis not present

## 2017-02-27 DIAGNOSIS — I10 Essential (primary) hypertension: Secondary | ICD-10-CM | POA: Diagnosis not present

## 2017-02-27 DIAGNOSIS — K219 Gastro-esophageal reflux disease without esophagitis: Secondary | ICD-10-CM | POA: Insufficient documentation

## 2017-02-27 DIAGNOSIS — J441 Chronic obstructive pulmonary disease with (acute) exacerbation: Secondary | ICD-10-CM | POA: Diagnosis not present

## 2017-02-27 DIAGNOSIS — M353 Polymyalgia rheumatica: Secondary | ICD-10-CM | POA: Diagnosis not present

## 2017-02-27 DIAGNOSIS — R609 Edema, unspecified: Secondary | ICD-10-CM | POA: Diagnosis not present

## 2017-03-07 DIAGNOSIS — I110 Hypertensive heart disease with heart failure: Secondary | ICD-10-CM | POA: Diagnosis not present

## 2017-03-07 DIAGNOSIS — Z7901 Long term (current) use of anticoagulants: Secondary | ICD-10-CM | POA: Diagnosis not present

## 2017-03-07 DIAGNOSIS — L97521 Non-pressure chronic ulcer of other part of left foot limited to breakdown of skin: Secondary | ICD-10-CM | POA: Diagnosis not present

## 2017-03-07 DIAGNOSIS — F419 Anxiety disorder, unspecified: Secondary | ICD-10-CM | POA: Diagnosis not present

## 2017-03-07 DIAGNOSIS — J441 Chronic obstructive pulmonary disease with (acute) exacerbation: Secondary | ICD-10-CM | POA: Diagnosis not present

## 2017-03-07 DIAGNOSIS — M1990 Unspecified osteoarthritis, unspecified site: Secondary | ICD-10-CM | POA: Diagnosis not present

## 2017-03-07 DIAGNOSIS — E559 Vitamin D deficiency, unspecified: Secondary | ICD-10-CM | POA: Diagnosis not present

## 2017-03-07 DIAGNOSIS — M109 Gout, unspecified: Secondary | ICD-10-CM | POA: Diagnosis not present

## 2017-03-07 DIAGNOSIS — M353 Polymyalgia rheumatica: Secondary | ICD-10-CM | POA: Diagnosis not present

## 2017-03-07 DIAGNOSIS — I509 Heart failure, unspecified: Secondary | ICD-10-CM | POA: Diagnosis not present

## 2017-03-07 DIAGNOSIS — I872 Venous insufficiency (chronic) (peripheral): Secondary | ICD-10-CM | POA: Diagnosis not present

## 2017-03-07 DIAGNOSIS — F329 Major depressive disorder, single episode, unspecified: Secondary | ICD-10-CM | POA: Diagnosis not present

## 2017-03-07 DIAGNOSIS — I480 Paroxysmal atrial fibrillation: Secondary | ICD-10-CM | POA: Diagnosis not present

## 2017-03-07 DIAGNOSIS — J449 Chronic obstructive pulmonary disease, unspecified: Secondary | ICD-10-CM | POA: Diagnosis not present

## 2017-03-07 DIAGNOSIS — K219 Gastro-esophageal reflux disease without esophagitis: Secondary | ICD-10-CM | POA: Diagnosis not present

## 2017-03-07 DIAGNOSIS — Z9181 History of falling: Secondary | ICD-10-CM | POA: Diagnosis not present

## 2017-03-14 DIAGNOSIS — I87311 Chronic venous hypertension (idiopathic) with ulcer of right lower extremity: Secondary | ICD-10-CM | POA: Diagnosis not present

## 2017-03-24 DIAGNOSIS — R0602 Shortness of breath: Secondary | ICD-10-CM | POA: Diagnosis not present

## 2017-04-03 DIAGNOSIS — I87311 Chronic venous hypertension (idiopathic) with ulcer of right lower extremity: Secondary | ICD-10-CM | POA: Diagnosis not present

## 2017-04-12 DIAGNOSIS — I495 Sick sinus syndrome: Secondary | ICD-10-CM | POA: Diagnosis not present

## 2017-04-12 DIAGNOSIS — I440 Atrioventricular block, first degree: Secondary | ICD-10-CM | POA: Diagnosis not present

## 2017-04-12 DIAGNOSIS — I4819 Other persistent atrial fibrillation: Secondary | ICD-10-CM | POA: Insufficient documentation

## 2017-04-12 DIAGNOSIS — R600 Localized edema: Secondary | ICD-10-CM | POA: Diagnosis not present

## 2017-04-12 DIAGNOSIS — I481 Persistent atrial fibrillation: Secondary | ICD-10-CM | POA: Diagnosis not present

## 2017-04-12 DIAGNOSIS — I10 Essential (primary) hypertension: Secondary | ICD-10-CM | POA: Diagnosis not present

## 2017-04-17 DIAGNOSIS — L97521 Non-pressure chronic ulcer of other part of left foot limited to breakdown of skin: Secondary | ICD-10-CM | POA: Diagnosis not present

## 2017-04-21 DIAGNOSIS — R0602 Shortness of breath: Secondary | ICD-10-CM | POA: Diagnosis not present

## 2017-04-24 DIAGNOSIS — Q661 Congenital talipes calcaneovarus: Secondary | ICD-10-CM | POA: Diagnosis not present

## 2017-04-24 DIAGNOSIS — G609 Hereditary and idiopathic neuropathy, unspecified: Secondary | ICD-10-CM | POA: Diagnosis not present

## 2017-04-24 DIAGNOSIS — L97521 Non-pressure chronic ulcer of other part of left foot limited to breakdown of skin: Secondary | ICD-10-CM | POA: Diagnosis not present

## 2017-04-26 DIAGNOSIS — I110 Hypertensive heart disease with heart failure: Secondary | ICD-10-CM | POA: Diagnosis not present

## 2017-04-26 DIAGNOSIS — M1990 Unspecified osteoarthritis, unspecified site: Secondary | ICD-10-CM | POA: Diagnosis not present

## 2017-04-26 DIAGNOSIS — Z7901 Long term (current) use of anticoagulants: Secondary | ICD-10-CM | POA: Diagnosis not present

## 2017-04-26 DIAGNOSIS — J449 Chronic obstructive pulmonary disease, unspecified: Secondary | ICD-10-CM | POA: Diagnosis not present

## 2017-04-26 DIAGNOSIS — I509 Heart failure, unspecified: Secondary | ICD-10-CM | POA: Diagnosis not present

## 2017-04-26 DIAGNOSIS — M353 Polymyalgia rheumatica: Secondary | ICD-10-CM | POA: Diagnosis not present

## 2017-04-26 DIAGNOSIS — F329 Major depressive disorder, single episode, unspecified: Secondary | ICD-10-CM | POA: Diagnosis not present

## 2017-04-26 DIAGNOSIS — I480 Paroxysmal atrial fibrillation: Secondary | ICD-10-CM | POA: Diagnosis not present

## 2017-04-26 DIAGNOSIS — K219 Gastro-esophageal reflux disease without esophagitis: Secondary | ICD-10-CM | POA: Diagnosis not present

## 2017-04-26 DIAGNOSIS — E559 Vitamin D deficiency, unspecified: Secondary | ICD-10-CM | POA: Diagnosis not present

## 2017-04-26 DIAGNOSIS — Z9181 History of falling: Secondary | ICD-10-CM | POA: Diagnosis not present

## 2017-04-26 DIAGNOSIS — M109 Gout, unspecified: Secondary | ICD-10-CM | POA: Diagnosis not present

## 2017-04-26 DIAGNOSIS — I872 Venous insufficiency (chronic) (peripheral): Secondary | ICD-10-CM | POA: Diagnosis not present

## 2017-04-26 DIAGNOSIS — F419 Anxiety disorder, unspecified: Secondary | ICD-10-CM | POA: Diagnosis not present

## 2017-05-02 DIAGNOSIS — M1990 Unspecified osteoarthritis, unspecified site: Secondary | ICD-10-CM | POA: Diagnosis not present

## 2017-05-02 DIAGNOSIS — J441 Chronic obstructive pulmonary disease with (acute) exacerbation: Secondary | ICD-10-CM | POA: Diagnosis not present

## 2017-05-02 DIAGNOSIS — M109 Gout, unspecified: Secondary | ICD-10-CM | POA: Diagnosis not present

## 2017-05-02 DIAGNOSIS — I509 Heart failure, unspecified: Secondary | ICD-10-CM | POA: Diagnosis not present

## 2017-05-02 DIAGNOSIS — K219 Gastro-esophageal reflux disease without esophagitis: Secondary | ICD-10-CM | POA: Diagnosis not present

## 2017-05-02 DIAGNOSIS — F329 Major depressive disorder, single episode, unspecified: Secondary | ICD-10-CM | POA: Diagnosis not present

## 2017-05-02 DIAGNOSIS — M353 Polymyalgia rheumatica: Secondary | ICD-10-CM | POA: Diagnosis not present

## 2017-05-02 DIAGNOSIS — I480 Paroxysmal atrial fibrillation: Secondary | ICD-10-CM | POA: Diagnosis not present

## 2017-05-02 DIAGNOSIS — L97521 Non-pressure chronic ulcer of other part of left foot limited to breakdown of skin: Secondary | ICD-10-CM | POA: Diagnosis not present

## 2017-05-02 DIAGNOSIS — F419 Anxiety disorder, unspecified: Secondary | ICD-10-CM | POA: Diagnosis not present

## 2017-05-02 DIAGNOSIS — I110 Hypertensive heart disease with heart failure: Secondary | ICD-10-CM | POA: Diagnosis not present

## 2017-05-02 DIAGNOSIS — E559 Vitamin D deficiency, unspecified: Secondary | ICD-10-CM | POA: Diagnosis not present

## 2017-05-02 DIAGNOSIS — I872 Venous insufficiency (chronic) (peripheral): Secondary | ICD-10-CM | POA: Diagnosis not present

## 2017-05-08 DIAGNOSIS — I480 Paroxysmal atrial fibrillation: Secondary | ICD-10-CM | POA: Diagnosis not present

## 2017-05-08 DIAGNOSIS — E559 Vitamin D deficiency, unspecified: Secondary | ICD-10-CM | POA: Diagnosis not present

## 2017-05-08 DIAGNOSIS — K219 Gastro-esophageal reflux disease without esophagitis: Secondary | ICD-10-CM | POA: Diagnosis not present

## 2017-05-08 DIAGNOSIS — M353 Polymyalgia rheumatica: Secondary | ICD-10-CM | POA: Diagnosis not present

## 2017-05-08 DIAGNOSIS — I872 Venous insufficiency (chronic) (peripheral): Secondary | ICD-10-CM | POA: Diagnosis not present

## 2017-05-08 DIAGNOSIS — M1990 Unspecified osteoarthritis, unspecified site: Secondary | ICD-10-CM | POA: Diagnosis not present

## 2017-05-08 DIAGNOSIS — F419 Anxiety disorder, unspecified: Secondary | ICD-10-CM | POA: Diagnosis not present

## 2017-05-08 DIAGNOSIS — M109 Gout, unspecified: Secondary | ICD-10-CM | POA: Diagnosis not present

## 2017-05-08 DIAGNOSIS — I110 Hypertensive heart disease with heart failure: Secondary | ICD-10-CM | POA: Diagnosis not present

## 2017-05-08 DIAGNOSIS — F329 Major depressive disorder, single episode, unspecified: Secondary | ICD-10-CM | POA: Diagnosis not present

## 2017-05-08 DIAGNOSIS — L97521 Non-pressure chronic ulcer of other part of left foot limited to breakdown of skin: Secondary | ICD-10-CM | POA: Diagnosis not present

## 2017-05-08 DIAGNOSIS — J449 Chronic obstructive pulmonary disease, unspecified: Secondary | ICD-10-CM | POA: Diagnosis not present

## 2017-05-08 DIAGNOSIS — I509 Heart failure, unspecified: Secondary | ICD-10-CM | POA: Diagnosis not present

## 2017-05-21 DIAGNOSIS — Q661 Congenital talipes calcaneovarus: Secondary | ICD-10-CM | POA: Diagnosis not present

## 2017-05-21 DIAGNOSIS — L97521 Non-pressure chronic ulcer of other part of left foot limited to breakdown of skin: Secondary | ICD-10-CM | POA: Diagnosis not present

## 2017-05-21 DIAGNOSIS — G609 Hereditary and idiopathic neuropathy, unspecified: Secondary | ICD-10-CM | POA: Diagnosis not present

## 2017-05-29 DIAGNOSIS — L97521 Non-pressure chronic ulcer of other part of left foot limited to breakdown of skin: Secondary | ICD-10-CM | POA: Diagnosis not present

## 2017-05-29 DIAGNOSIS — G609 Hereditary and idiopathic neuropathy, unspecified: Secondary | ICD-10-CM | POA: Diagnosis not present

## 2017-05-29 DIAGNOSIS — Q661 Congenital talipes calcaneovarus: Secondary | ICD-10-CM | POA: Diagnosis not present

## 2017-06-07 DIAGNOSIS — F419 Anxiety disorder, unspecified: Secondary | ICD-10-CM | POA: Diagnosis not present

## 2017-06-07 DIAGNOSIS — M353 Polymyalgia rheumatica: Secondary | ICD-10-CM | POA: Diagnosis not present

## 2017-06-07 DIAGNOSIS — M109 Gout, unspecified: Secondary | ICD-10-CM | POA: Diagnosis not present

## 2017-06-07 DIAGNOSIS — F329 Major depressive disorder, single episode, unspecified: Secondary | ICD-10-CM | POA: Diagnosis not present

## 2017-06-07 DIAGNOSIS — L97521 Non-pressure chronic ulcer of other part of left foot limited to breakdown of skin: Secondary | ICD-10-CM | POA: Diagnosis not present

## 2017-06-07 DIAGNOSIS — K219 Gastro-esophageal reflux disease without esophagitis: Secondary | ICD-10-CM | POA: Diagnosis not present

## 2017-06-07 DIAGNOSIS — I110 Hypertensive heart disease with heart failure: Secondary | ICD-10-CM | POA: Diagnosis not present

## 2017-06-07 DIAGNOSIS — E559 Vitamin D deficiency, unspecified: Secondary | ICD-10-CM | POA: Diagnosis not present

## 2017-06-07 DIAGNOSIS — J449 Chronic obstructive pulmonary disease, unspecified: Secondary | ICD-10-CM | POA: Diagnosis not present

## 2017-06-07 DIAGNOSIS — M1990 Unspecified osteoarthritis, unspecified site: Secondary | ICD-10-CM | POA: Diagnosis not present

## 2017-06-07 DIAGNOSIS — I872 Venous insufficiency (chronic) (peripheral): Secondary | ICD-10-CM | POA: Diagnosis not present

## 2017-06-07 DIAGNOSIS — I480 Paroxysmal atrial fibrillation: Secondary | ICD-10-CM | POA: Diagnosis not present

## 2017-06-07 DIAGNOSIS — I509 Heart failure, unspecified: Secondary | ICD-10-CM | POA: Diagnosis not present

## 2017-06-15 DIAGNOSIS — I110 Hypertensive heart disease with heart failure: Secondary | ICD-10-CM | POA: Diagnosis not present

## 2017-06-15 DIAGNOSIS — J449 Chronic obstructive pulmonary disease, unspecified: Secondary | ICD-10-CM | POA: Diagnosis not present

## 2017-06-15 DIAGNOSIS — I872 Venous insufficiency (chronic) (peripheral): Secondary | ICD-10-CM | POA: Diagnosis not present

## 2017-06-15 DIAGNOSIS — F329 Major depressive disorder, single episode, unspecified: Secondary | ICD-10-CM | POA: Diagnosis not present

## 2017-06-15 DIAGNOSIS — E559 Vitamin D deficiency, unspecified: Secondary | ICD-10-CM | POA: Diagnosis not present

## 2017-06-15 DIAGNOSIS — M1990 Unspecified osteoarthritis, unspecified site: Secondary | ICD-10-CM | POA: Diagnosis not present

## 2017-06-15 DIAGNOSIS — F419 Anxiety disorder, unspecified: Secondary | ICD-10-CM | POA: Diagnosis not present

## 2017-06-15 DIAGNOSIS — I480 Paroxysmal atrial fibrillation: Secondary | ICD-10-CM | POA: Diagnosis not present

## 2017-06-15 DIAGNOSIS — K219 Gastro-esophageal reflux disease without esophagitis: Secondary | ICD-10-CM | POA: Diagnosis not present

## 2017-06-15 DIAGNOSIS — I509 Heart failure, unspecified: Secondary | ICD-10-CM | POA: Diagnosis not present

## 2017-06-15 DIAGNOSIS — L97521 Non-pressure chronic ulcer of other part of left foot limited to breakdown of skin: Secondary | ICD-10-CM | POA: Diagnosis not present

## 2017-06-15 DIAGNOSIS — M109 Gout, unspecified: Secondary | ICD-10-CM | POA: Diagnosis not present

## 2017-06-15 DIAGNOSIS — M353 Polymyalgia rheumatica: Secondary | ICD-10-CM | POA: Diagnosis not present

## 2017-07-31 DIAGNOSIS — Q661 Congenital talipes calcaneovarus: Secondary | ICD-10-CM | POA: Diagnosis not present

## 2017-07-31 DIAGNOSIS — G609 Hereditary and idiopathic neuropathy, unspecified: Secondary | ICD-10-CM | POA: Diagnosis not present

## 2017-07-31 DIAGNOSIS — L97521 Non-pressure chronic ulcer of other part of left foot limited to breakdown of skin: Secondary | ICD-10-CM | POA: Diagnosis not present

## 2017-07-31 DIAGNOSIS — M2032 Hallux varus (acquired), left foot: Secondary | ICD-10-CM | POA: Diagnosis not present

## 2017-09-17 DIAGNOSIS — I10 Essential (primary) hypertension: Secondary | ICD-10-CM | POA: Diagnosis not present

## 2017-09-17 DIAGNOSIS — M353 Polymyalgia rheumatica: Secondary | ICD-10-CM | POA: Diagnosis not present

## 2017-09-17 DIAGNOSIS — F419 Anxiety disorder, unspecified: Secondary | ICD-10-CM | POA: Diagnosis not present

## 2017-09-17 DIAGNOSIS — K219 Gastro-esophageal reflux disease without esophagitis: Secondary | ICD-10-CM | POA: Diagnosis not present

## 2017-09-17 DIAGNOSIS — R609 Edema, unspecified: Secondary | ICD-10-CM | POA: Diagnosis not present

## 2017-10-10 DIAGNOSIS — I340 Nonrheumatic mitral (valve) insufficiency: Secondary | ICD-10-CM | POA: Diagnosis not present

## 2017-10-10 DIAGNOSIS — I481 Persistent atrial fibrillation: Secondary | ICD-10-CM | POA: Diagnosis not present

## 2017-10-10 DIAGNOSIS — I495 Sick sinus syndrome: Secondary | ICD-10-CM | POA: Diagnosis not present

## 2017-10-10 DIAGNOSIS — R600 Localized edema: Secondary | ICD-10-CM | POA: Diagnosis not present

## 2017-10-10 DIAGNOSIS — I10 Essential (primary) hypertension: Secondary | ICD-10-CM | POA: Diagnosis not present

## 2017-10-31 DIAGNOSIS — M2032 Hallux varus (acquired), left foot: Secondary | ICD-10-CM | POA: Diagnosis not present

## 2017-10-31 DIAGNOSIS — Q661 Congenital talipes calcaneovarus, unspecified foot: Secondary | ICD-10-CM | POA: Diagnosis not present

## 2017-10-31 DIAGNOSIS — G609 Hereditary and idiopathic neuropathy, unspecified: Secondary | ICD-10-CM | POA: Diagnosis not present

## 2017-11-07 DIAGNOSIS — J411 Mucopurulent chronic bronchitis: Secondary | ICD-10-CM | POA: Diagnosis not present

## 2017-11-07 DIAGNOSIS — G8929 Other chronic pain: Secondary | ICD-10-CM | POA: Diagnosis not present

## 2017-11-07 DIAGNOSIS — M25512 Pain in left shoulder: Secondary | ICD-10-CM | POA: Diagnosis not present

## 2017-11-26 DIAGNOSIS — J441 Chronic obstructive pulmonary disease with (acute) exacerbation: Secondary | ICD-10-CM | POA: Diagnosis not present

## 2017-11-26 DIAGNOSIS — G8929 Other chronic pain: Secondary | ICD-10-CM | POA: Diagnosis not present

## 2017-11-26 DIAGNOSIS — M25512 Pain in left shoulder: Secondary | ICD-10-CM | POA: Diagnosis not present

## 2017-11-26 DIAGNOSIS — J00 Acute nasopharyngitis [common cold]: Secondary | ICD-10-CM | POA: Diagnosis not present

## 2018-02-11 DIAGNOSIS — G609 Hereditary and idiopathic neuropathy, unspecified: Secondary | ICD-10-CM | POA: Diagnosis not present

## 2018-02-11 DIAGNOSIS — Q661 Congenital talipes calcaneovarus, unspecified foot: Secondary | ICD-10-CM | POA: Diagnosis not present

## 2018-02-11 DIAGNOSIS — M2032 Hallux varus (acquired), left foot: Secondary | ICD-10-CM | POA: Diagnosis not present

## 2018-02-11 DIAGNOSIS — M2041 Other hammer toe(s) (acquired), right foot: Secondary | ICD-10-CM | POA: Diagnosis not present

## 2018-02-11 DIAGNOSIS — L97521 Non-pressure chronic ulcer of other part of left foot limited to breakdown of skin: Secondary | ICD-10-CM | POA: Diagnosis not present

## 2018-04-01 DIAGNOSIS — R42 Dizziness and giddiness: Secondary | ICD-10-CM | POA: Diagnosis not present

## 2018-04-01 DIAGNOSIS — J441 Chronic obstructive pulmonary disease with (acute) exacerbation: Secondary | ICD-10-CM | POA: Diagnosis not present

## 2018-04-01 DIAGNOSIS — I10 Essential (primary) hypertension: Secondary | ICD-10-CM | POA: Diagnosis not present

## 2018-04-11 DIAGNOSIS — R600 Localized edema: Secondary | ICD-10-CM | POA: Diagnosis not present

## 2018-04-11 DIAGNOSIS — I10 Essential (primary) hypertension: Secondary | ICD-10-CM | POA: Diagnosis not present

## 2018-04-11 DIAGNOSIS — I440 Atrioventricular block, first degree: Secondary | ICD-10-CM | POA: Diagnosis not present

## 2018-04-11 DIAGNOSIS — R0602 Shortness of breath: Secondary | ICD-10-CM | POA: Insufficient documentation

## 2018-06-19 DIAGNOSIS — M79674 Pain in right toe(s): Secondary | ICD-10-CM | POA: Diagnosis not present

## 2018-06-19 DIAGNOSIS — M205X1 Other deformities of toe(s) (acquired), right foot: Secondary | ICD-10-CM | POA: Diagnosis not present

## 2018-06-19 DIAGNOSIS — B351 Tinea unguium: Secondary | ICD-10-CM | POA: Diagnosis not present

## 2018-06-19 DIAGNOSIS — G609 Hereditary and idiopathic neuropathy, unspecified: Secondary | ICD-10-CM | POA: Diagnosis not present

## 2018-06-19 DIAGNOSIS — M1A0721 Idiopathic chronic gout, left ankle and foot, with tophus (tophi): Secondary | ICD-10-CM | POA: Diagnosis not present

## 2018-06-19 DIAGNOSIS — M79675 Pain in left toe(s): Secondary | ICD-10-CM | POA: Diagnosis not present

## 2018-06-19 DIAGNOSIS — Q661 Congenital talipes calcaneovarus, unspecified foot: Secondary | ICD-10-CM | POA: Diagnosis not present

## 2018-07-18 DIAGNOSIS — I10 Essential (primary) hypertension: Secondary | ICD-10-CM | POA: Diagnosis not present

## 2018-07-18 DIAGNOSIS — I071 Rheumatic tricuspid insufficiency: Secondary | ICD-10-CM | POA: Insufficient documentation

## 2018-07-18 DIAGNOSIS — I272 Pulmonary hypertension, unspecified: Secondary | ICD-10-CM | POA: Diagnosis not present

## 2018-07-18 DIAGNOSIS — I4819 Other persistent atrial fibrillation: Secondary | ICD-10-CM | POA: Diagnosis not present

## 2018-07-18 DIAGNOSIS — I340 Nonrheumatic mitral (valve) insufficiency: Secondary | ICD-10-CM | POA: Diagnosis not present

## 2018-07-18 DIAGNOSIS — R0602 Shortness of breath: Secondary | ICD-10-CM | POA: Diagnosis not present

## 2018-07-18 DIAGNOSIS — I495 Sick sinus syndrome: Secondary | ICD-10-CM | POA: Diagnosis not present

## 2018-09-24 DIAGNOSIS — B351 Tinea unguium: Secondary | ICD-10-CM | POA: Diagnosis not present

## 2018-09-24 DIAGNOSIS — Q661 Congenital talipes calcaneovarus, unspecified foot: Secondary | ICD-10-CM | POA: Diagnosis not present

## 2018-09-24 DIAGNOSIS — G609 Hereditary and idiopathic neuropathy, unspecified: Secondary | ICD-10-CM | POA: Diagnosis not present

## 2018-09-24 DIAGNOSIS — M205X1 Other deformities of toe(s) (acquired), right foot: Secondary | ICD-10-CM | POA: Diagnosis not present

## 2018-09-24 DIAGNOSIS — M1A0721 Idiopathic chronic gout, left ankle and foot, with tophus (tophi): Secondary | ICD-10-CM | POA: Diagnosis not present

## 2018-10-22 DIAGNOSIS — I495 Sick sinus syndrome: Secondary | ICD-10-CM | POA: Diagnosis not present

## 2018-10-31 DIAGNOSIS — Z23 Encounter for immunization: Secondary | ICD-10-CM | POA: Diagnosis not present

## 2018-10-31 DIAGNOSIS — R21 Rash and other nonspecific skin eruption: Secondary | ICD-10-CM | POA: Diagnosis not present

## 2018-12-17 DIAGNOSIS — M79675 Pain in left toe(s): Secondary | ICD-10-CM | POA: Diagnosis not present

## 2018-12-17 DIAGNOSIS — Q661 Congenital talipes calcaneovarus, unspecified foot: Secondary | ICD-10-CM | POA: Diagnosis not present

## 2018-12-17 DIAGNOSIS — B351 Tinea unguium: Secondary | ICD-10-CM | POA: Diagnosis not present

## 2018-12-17 DIAGNOSIS — M79674 Pain in right toe(s): Secondary | ICD-10-CM | POA: Diagnosis not present

## 2018-12-17 DIAGNOSIS — G609 Hereditary and idiopathic neuropathy, unspecified: Secondary | ICD-10-CM | POA: Diagnosis not present

## 2018-12-17 DIAGNOSIS — M205X1 Other deformities of toe(s) (acquired), right foot: Secondary | ICD-10-CM | POA: Diagnosis not present

## 2018-12-22 IMAGING — CR DG FOOT COMPLETE 3+V*L*
1 series · 3 of 3 positions shown · non-contrast
Comparison: 03/09/2015

CLINICAL DATA: Nonhealing left foot wound, initial encounter

EXAM:
LEFT FOOT - COMPLETE 3+ VIEW

[Series 1: dg foot complete left · 0.14mm/px · 3 of 3 slices shown]
[im 1/3]
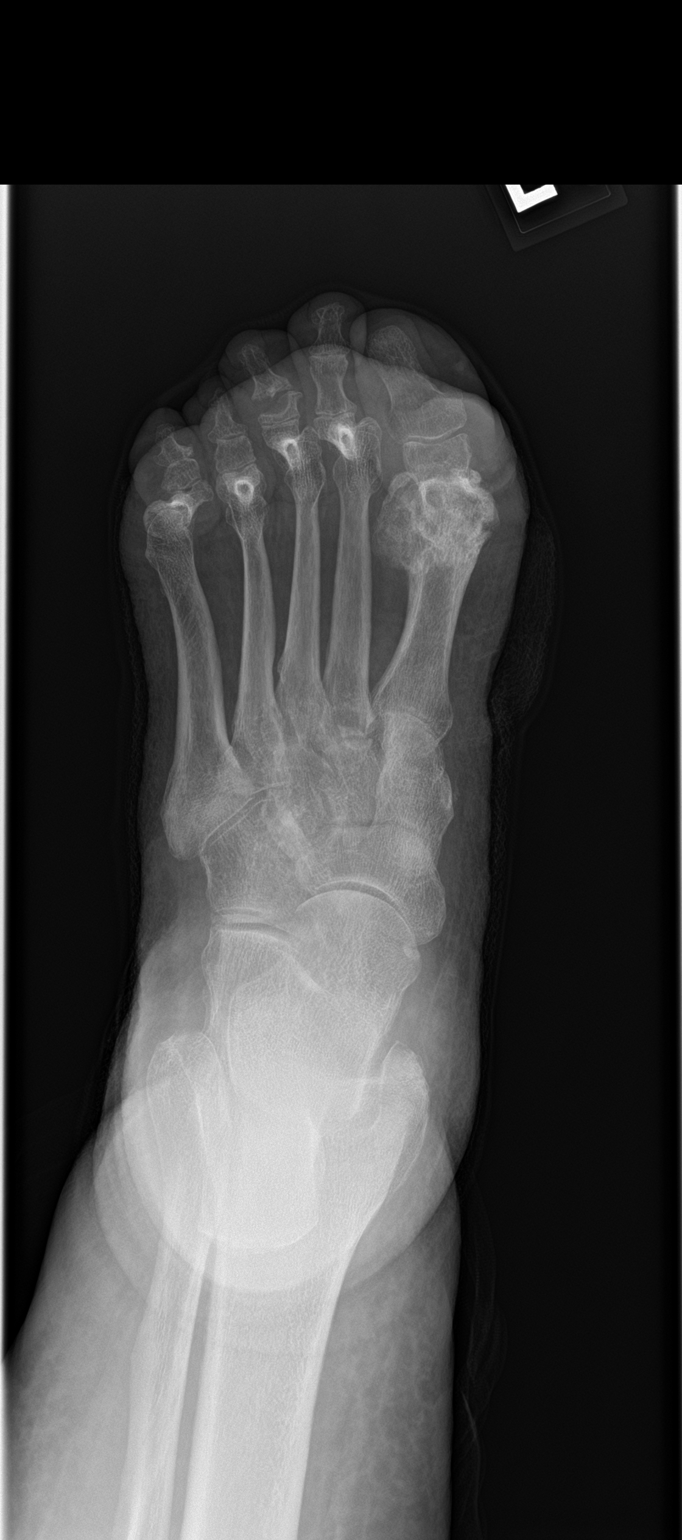
[im 2/3]
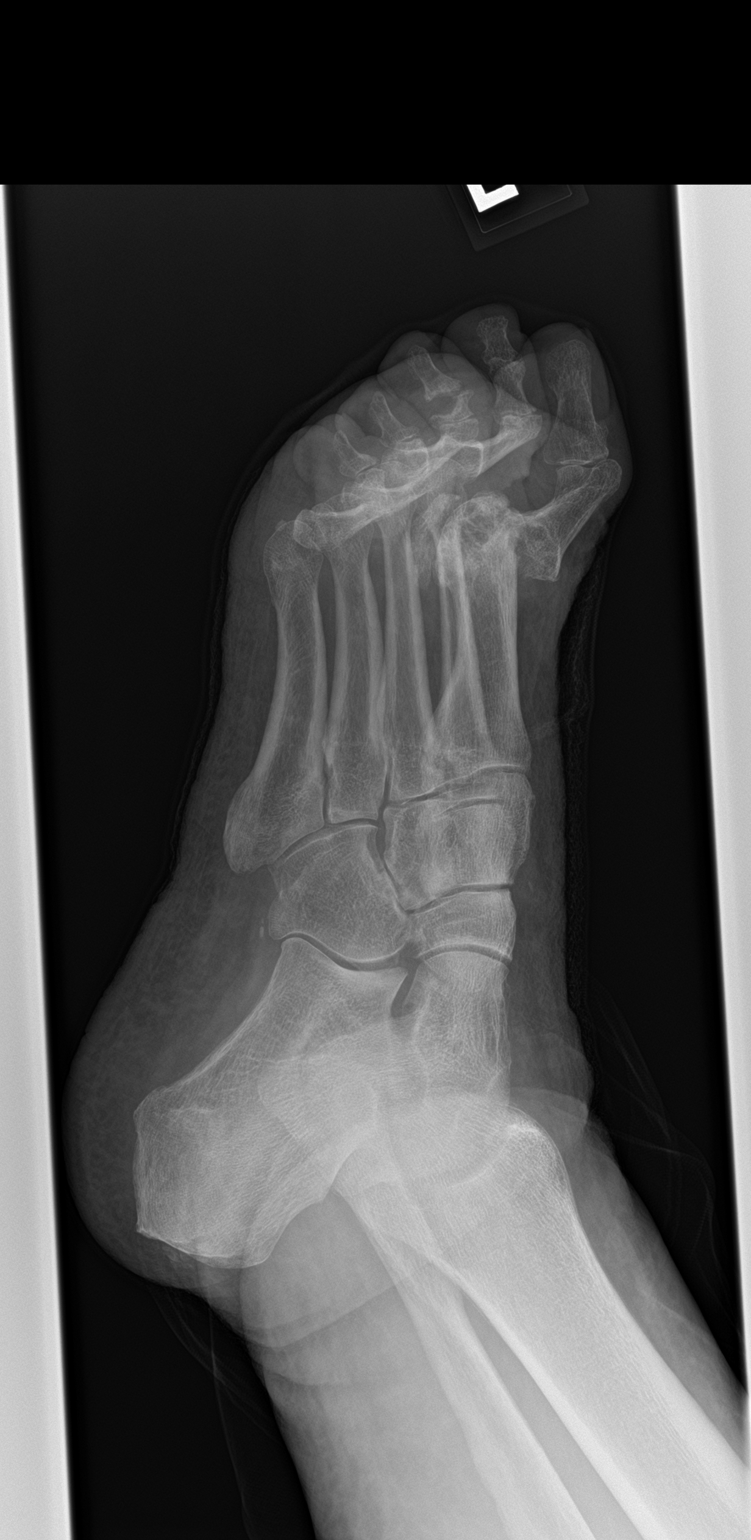
[im 3/3]
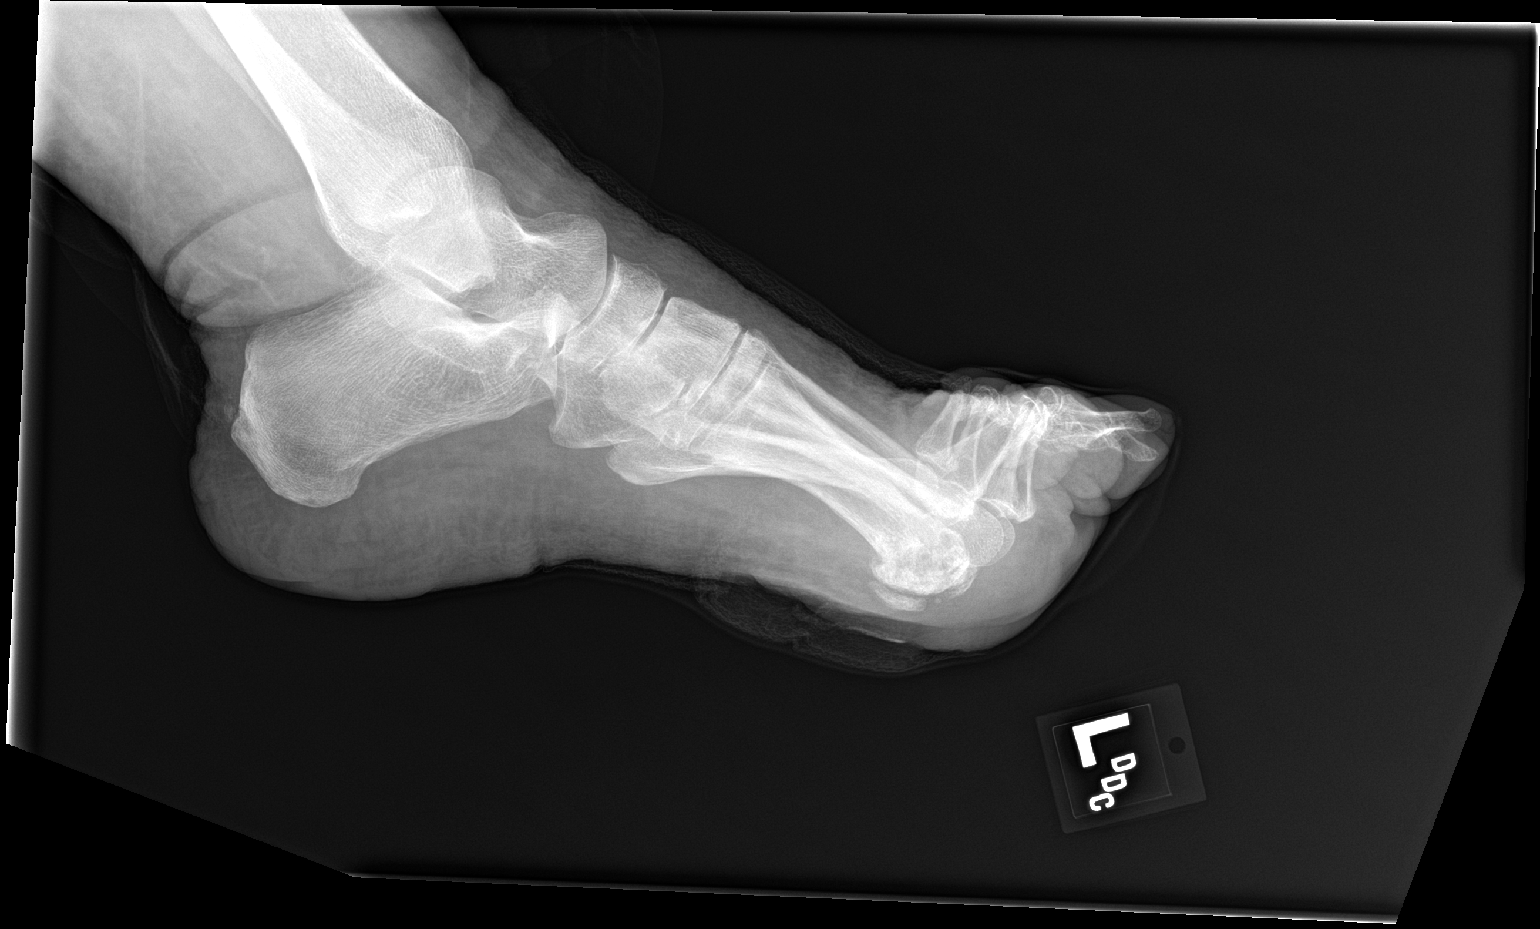

[3 of 3 positions shown; findings below may reference images not displayed]

FINDINGS: Significant degenerative changes of the first MTP joint are again
identified. Dislocation/subluxation dorsally of the first proximal
phalanx is again identified. No acute fracture or dislocation is
noted. No soft tissue changes are seen. The soft tissue wound is
noted inferiorly. No definitive erosive changes to suggest
osteomyelitis are noted.
IMPRESSION: Soft tissue wound and degenerative changes as described. No acute
abnormality noted.

## 2018-12-22 IMAGING — CR DG FOOT COMPLETE 3+V*R*
1 series · 3 of 3 positions shown · non-contrast
Comparison: None.

CLINICAL DATA: Digit pain

EXAM:
RIGHT FOOT COMPLETE - 3+ VIEW

[Series 1: dg foot complete right · 0.14mm/px · 3 of 3 slices shown]
[im 1/3]
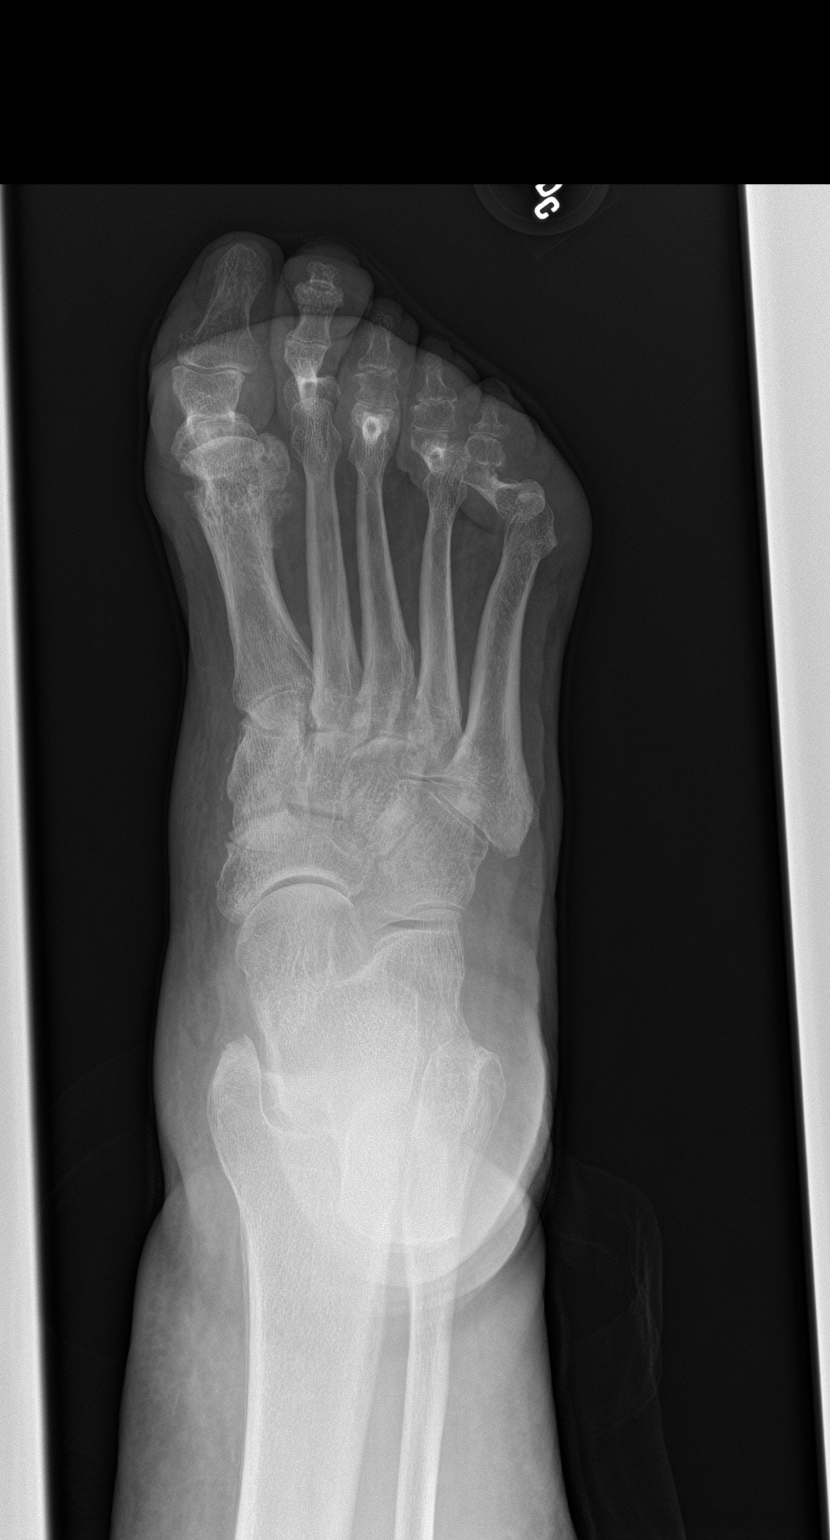
[im 2/3]
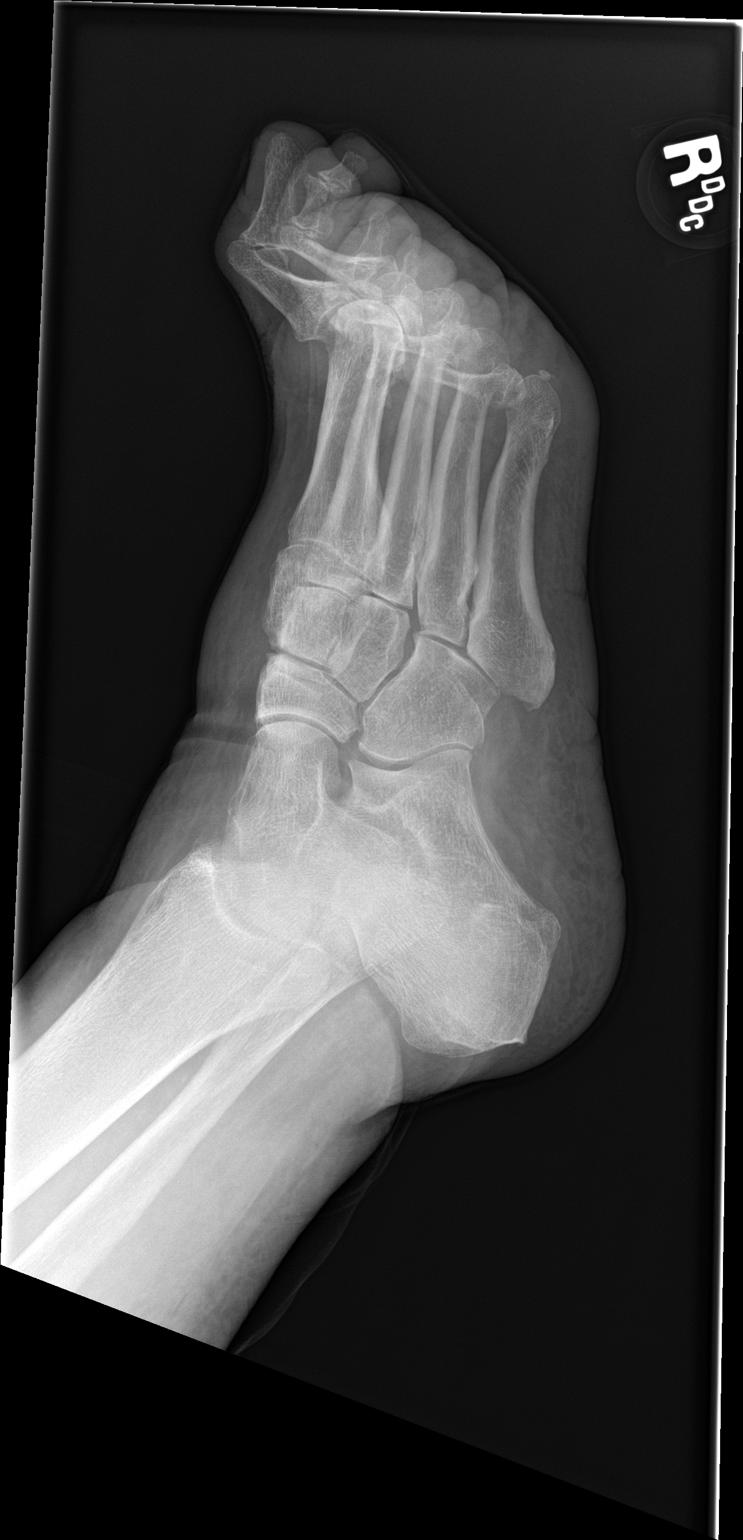
[im 3/3]
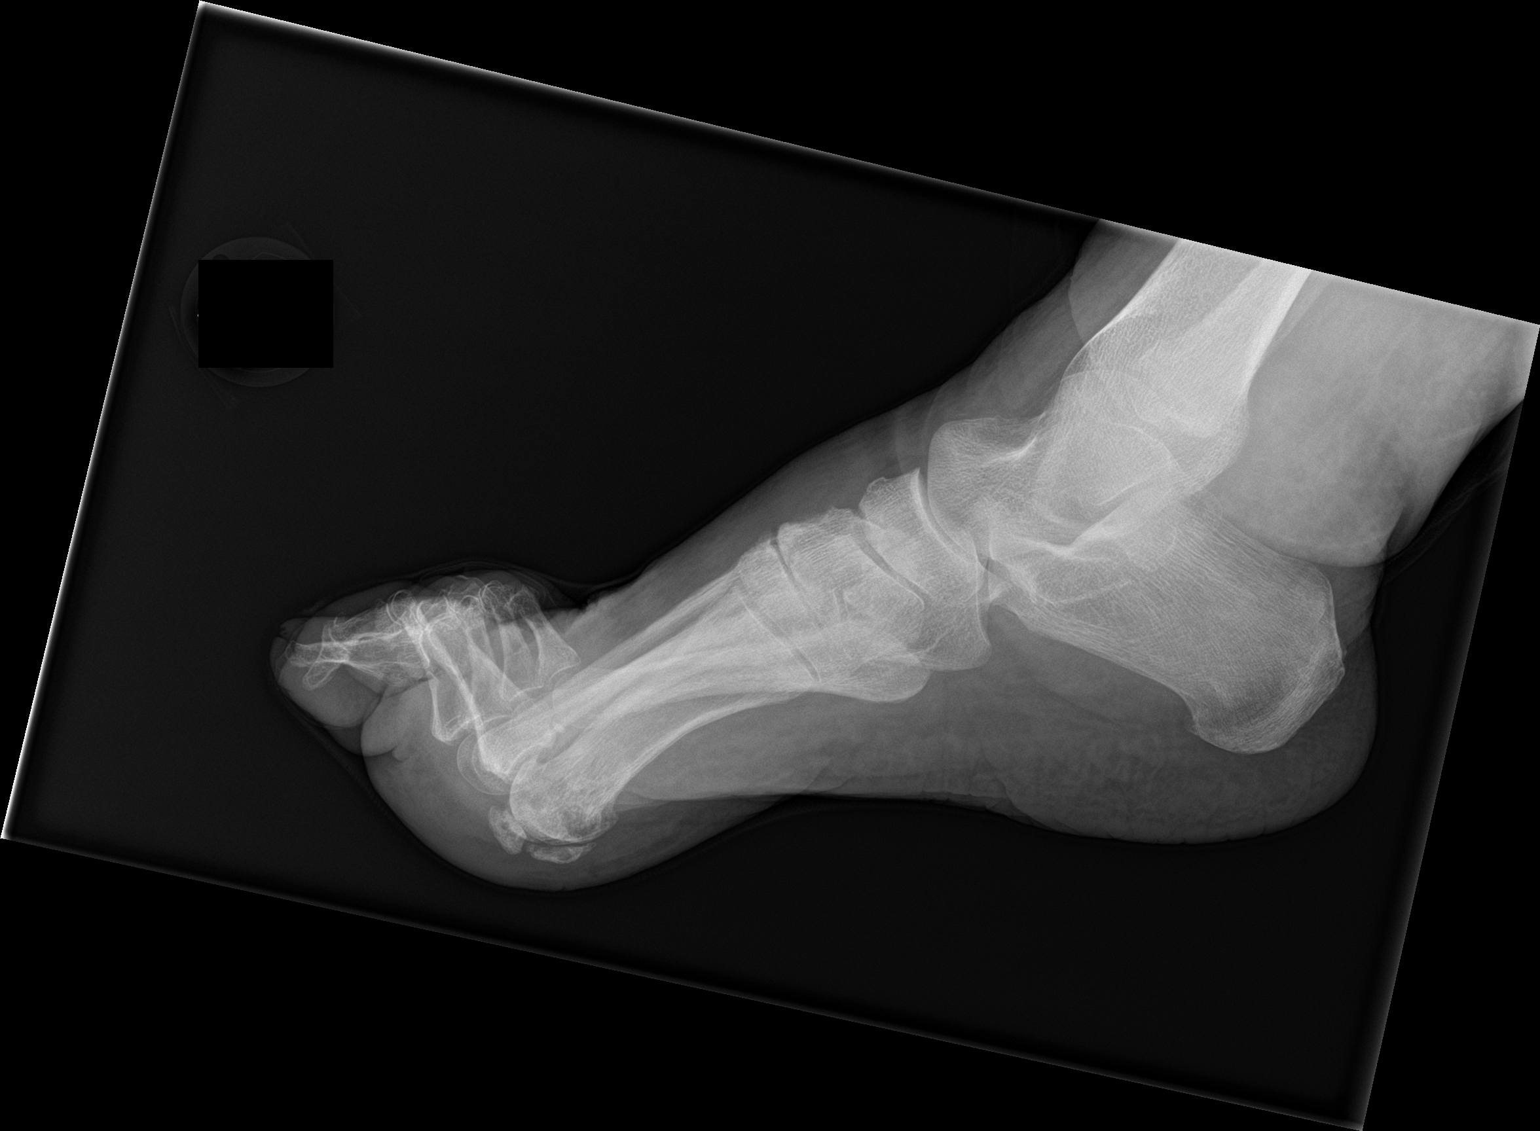

[3 of 3 positions shown; findings below may reference images not displayed]

FINDINGS: Some degenerative changes of first MTP joint are noted. No acute
fracture or dislocation is noted. Generalized soft tissue swelling
is noted. No ulcers are seen. No erosive changes are noted.
IMPRESSION: Soft tissue changes without acute abnormality.

## 2019-01-13 ENCOUNTER — Telehealth: Payer: Self-pay | Admitting: Primary Care

## 2019-01-13 NOTE — Telephone Encounter (Signed)
Spoke with patient and patient's caregiver Marquis Lunch regarding Palliative services and I rec'd verbal consent from patient to begin Palliative services.  I have scheduled an In--person Consult for 01/21/19 @ 12:30 PM

## 2019-01-21 ENCOUNTER — Other Ambulatory Visit: Payer: PPO | Admitting: Primary Care

## 2019-01-21 ENCOUNTER — Other Ambulatory Visit: Payer: Self-pay

## 2019-01-21 DIAGNOSIS — Z515 Encounter for palliative care: Secondary | ICD-10-CM | POA: Diagnosis not present

## 2019-01-21 NOTE — Progress Notes (Addendum)
[facility] Consult Note Telephone: [phone]  Fax: [phone]  PATIENT NAME: Cynthia Terry [place] [address] [phone] (home)  DOB: May 08, 1924 MRN: [id number]  PRIMARY CARE PROVIDER:   Valera Castle, MD, [place] [address] [phone]  REFERRING PROVIDER:  Valera Castle, MD [address] [place],  [place] [address] [phone]  RESPONSIBLE PARTY:   Extended Emergency Contact Information Primary Emergency Contact: Luvenia Heller I Address: [place], [place] [address] Home Phone: [phone] Relation: None  I met with Dagoberto Reef at her home with her son and daughter and caregiver. She's a 85 year old woman with valve and congestive heart disease. She lives in her own  home and wishes to remain there. She recounts how she took care of her parents and her husband as they were ill and died. Her family does want her to stay in place.  ASSESSMENT AND RECOMMENDATIONS:   1. Advance Care Planning/Goals of Care: Goals include to maximize quality of life and symptom management. We discuss the MOST form choices and the reason for preparing it, so that she could let her children know her wishes. She said she would not want to be prolonged but that if something would make her life more comfortable to treat she would be willing to do that. We completed the form with choices of do not resuscitate, limited interventions, use of antibiotics and IV fluids, no feeding tube. This was left in home and scanned to [facility].    2. Symptom Management:   Caregiving Strain: We discuss increasing caregiving needs as her memory and physical function decline.  We discussed that  she had moderate cognitive impairment. This seemed new information, although they could see many behavioral and memory changes. Historically she makes some poor financial decisions that might impact her ability  to obtain needed care.  We discussed the need for power of attorney to take over some of these IADLs when they becomes apparent that the patient can no longer manage them.  She has a caregiver, who's there during the day. I discussed with her son that they may need to take turns being with her at night or have another caregiver at night times as she does ambulate by herself. She has a lifeline and says she always has it on her person. We also discussed the ramp and her son is going to look into a ramp building project. This may improve her ability to get out as it is probably 60 feet to ambulate to the car, which is becoming taxing. She could use a wheelchair to the car and then pivot to get in and out of the car.  Safety: Safety is a potential issue. She ambulates at home with a walker. She reports she's had several falls. Her home is tidy with clear walkways and for the most part she can ambulate safely. We looked at her bathroom it is small and we discussed ways to make it more functional in the shower and toilet and  to incorporate safety considerations. We discussed her trying out a walker with the wheels or a Rollator which would give her the option of resting as she has poor stamina.We discussed elopement risk. Family states she does get more confused at night but does not feel she would leave the house as her stamina would limit her.  Nutrition/Hydration:  Her intake  it was reported to be sporadic and she sometimes forgets to  eat. We discussed labeling food so she knows she has to eat for  that day. We discussed labeling water so she knows she's had enough intake for the day.   Depression: She states her best friend recently passed away and became tearful saying she didn't know why she was still here at her age. She lamented that she has lost all her friends and peers. I recommend considering a change in antidepressant which may also improve her memory and sleep.  3. Family /Caregiver/Community  Supports: Has son and daughter who oversee care and are POA. Patient has a paid caregiver most days, but no one at night. Has a life line.  4. Cognitive / Functional decline: States she reads for enjoyment, but family states she mostly watches TV. She ambulates in home with moderate difficulty.           5. Follow up Palliative Care Visit: Palliative care will continue to follow for goals of care clarification and symptom management. Family will look into ramp, modifications in bathroom for safety e.g tub bench, toilet seat rails and additional grab bars. Will consider hs monitoring. Return 4-6 weeks or prn.  I spent 75 minutes providing this consultation,  from 1300 to 1415. More than 50% of the time in this consultation was spent coordinating communication.   HISTORY OF PRESENT ILLNESS:  Cynthia Terry is a 85 y.o. year old female with multiple medical problems including valve disease, congestive heart failure, cognitive impairment, a fib, HTN, depression. Palliative Care was asked to follow this patient by consultation request of Kym Groom Guy Begin, * to help address advance care planning and goals of care. This is the initial visit.  CODE STATUS:  do not resuscitate, limited interventions, use of antibiotics and IV fluids, no feeding tube  PPS: 40% HOSPICE ELIGIBILITY/DIAGNOSIS: no  PAST MEDICAL HISTORY:Tricuspid insufficiency, mitral insufficiency, SOBOE, leg edema, persistent a fib, anxiety, depression, COPD, PMR, OA, gout. SOCIAL HX:  Social History   Tobacco Use  . Smoking status: Never Smoker  . Smokeless tobacco: Never Used  Substance Use Topics  . Alcohol use: No    Alcohol/week: 0.0 standard drinks    ALLERGIES: No Known Allergies   PERTINENT MEDICATIONS:  Outpatient Encounter Medications as of 01/21/2019  Medication Sig  . allopurinol (ZYLOPRIM) 100 MG tablet Take by mouth.  Marland Kitchen allopurinol (ZYLOPRIM) 100 MG tablet   . cephALEXin (KEFLEX) 500 MG capsule Take 1 capsule  (500 mg total) by mouth 3 (three) times daily.  . citalopram (CELEXA) 10 MG tablet Take by mouth.  . Coenzyme Q10-Vitamin E 100-100 MG-UNIT CAPS Take by mouth.  . furosemide (LASIX) 20 MG tablet TAKE ONE (1) TABLET EACH DAY  . Ipratropium-Albuterol (COMBIVENT) 20-100 MCG/ACT AERS respimat Inhale into the lungs.  Marland Kitchen levalbuterol (XOPENEX) 0.63 MG/3ML nebulizer solution Inhale into the lungs.  . meclizine (ANTIVERT) 25 MG tablet   . metoprolol (LOPRESSOR) 100 MG tablet TAKE ONE (1) TABLET BY MOUTH TWO (2) TIMES DAILY  . montelukast (SINGULAIR) 10 MG tablet TAKE ONE TABLET AT BEDTIME  . montelukast (SINGULAIR) 10 MG tablet   . omeprazole (PRILOSEC) 20 MG capsule Take by mouth.  Marland Kitchen omeprazole (PRILOSEC) 20 MG capsule   . Rivaroxaban (XARELTO) 15 MG TABS tablet TAKE ONE (1) TABLET BY MOUTH EVERY DAY WITH DINNER  . spironolactone (ALDACTONE) 25 MG tablet TAKE ONE (1) TABLET BY MOUTH EVERY DAY   No facility-administered encounter medications on  file as of 01/21/2019.    PHYSICAL EXAM / ROS:   Current and past weights: 196 lbs from hosp. Record. BMI 33.6 General: NAD, frail appearing, obese Cardiovascular: no chest pain reported, no edema Pulmonary: no cough, no increased SOB, room air.  Abdomen: appetite fair to good, denies constipation, continent of bowel GU: denies dysuria, continent of urine MSK:  no joint deformities, ambulatory with walker. Has had some falls. Skin: no rashes or wounds reported Neurological: Weakness, forgetfulness, states balance issues, has had some falls.  States some confusion at hs.  Mild to moderate cognitive impairment, some forgetfulness re dates, recent events.   Jason Coop, NP  COVID-19 PATIENT SCREENING TOOL  Person answering questions: ________MR [place]___________ _____   1.  Is the patient or any family member in the home showing any signs or symptoms regarding respiratory infection?               Person with Symptom-  _____na______________________  a. Fever                                                                          Yes___ No___          ___________________  b. Shortness of breath                                                    Yes___ No___          ___________________ c. Cough/congestion                                       Yes___  No___         ___________________ d. Body aches/pains                                                         Yes___ No___        ____________________ e. Gastrointestinal symptoms (diarrhea, nausea)           Yes___ No___        ____________________  2. Within the past 14 days, has anyone living in the home had any contact with someone with or under investigation for COVID-19?    Yes___ No_x_   Person __________________

## 2019-02-18 DIAGNOSIS — I495 Sick sinus syndrome: Secondary | ICD-10-CM | POA: Diagnosis not present

## 2019-02-20 ENCOUNTER — Telehealth: Payer: Self-pay

## 2019-02-21 ENCOUNTER — Telehealth: Payer: Self-pay

## 2019-02-21 NOTE — Telephone Encounter (Signed)
Received a message to call patient's caregiver regarding area on shoulder. Placed call to [facility] who reported that patient has new redness on shoulder, now with some swelling and bruising. Unsure of etiology. Patient denies any trauma or injury to area. Discussed that if area is swelling and bruising, patient may need to be evaluated and have X ray done. Patient reports area feeling sore, but no loss in function. Will discuss with NP.

## 2019-02-21 NOTE — Telephone Encounter (Signed)
Phone call placed to patient to check back in about area on her shoulder. Patient stated "it's better". Spoke with caregiver, Rip Harbour, who shared that area is about the same. Patient does appear to be in less discomfort today. PCP is not back in the office until Monday and if area is not improved, they will take patient in to be seen. Patient's children do not want to take her to the hospital and risk exposure to Covid. Encouraged to call with any further concerns or questions. Will update primary palliative NP

## 2019-02-21 NOTE — Telephone Encounter (Signed)
After discussion with Amy NP, follow up call placed to caregiver to provide recommendations is for patient to be evaluated and have X ray done. Melinda expressed understanding. Spoke also with son, who expressed understanding but stated he would like to wait 24 hours to see how patient is doing.

## 2019-03-18 ENCOUNTER — Other Ambulatory Visit: Payer: Self-pay

## 2019-03-18 ENCOUNTER — Other Ambulatory Visit: Payer: PPO | Admitting: Primary Care

## 2019-03-18 DIAGNOSIS — Z515 Encounter for palliative care: Secondary | ICD-10-CM | POA: Diagnosis not present

## 2019-03-18 NOTE — Progress Notes (Signed)
[facility] Consult Note Telephone: [phone]  Fax: [phone]   PATIENT NAME: Cynthia Terry [place] [address] [phone] (home)  DOB: 03-24-1924 MRN: [id number]  PRIMARY CARE PROVIDER:   Valera Castle, MD, [place] [address] [phone]  REFERRING PROVIDER:  Valera Castle, [place],  [place] [address] [phone]  RESPONSIBLE PARTY:   Extended Emergency Contact Information Primary Emergency Contact: [place] I Address: [address] [place], [place] [address] Home Phone: [phone] Relation: None  I met in her home with Mrs. Cawley,her  son and daughter and caregiver Rip Harbour.  ASSESSMENT AND RECOMMENDATIONS:   1. Advance Care Planning/Goals of Care: Goals include to maximize quality of life and symptom management. MOST form with choices of do not resuscitate, limited interventions, use of antibiotics and IV fluids, no feeding . We discussed their goal for patient to remain in place with caregivers as long as she can.  2. Symptom Management:   Dyspnea:  Please send prescription for a spacer to pharmacy of choice.Her fingers have limited ROM to use inhaler. Currently she has nebulizer treatments but she cannot use them without assistance. Nebulizer relieves coughing well. Oxygen has not provided any relief for DOE. She has weekly asthma events which require albuterol.  Dermatitis: We discussed her recent (several weeks ago) skin dermatitis or bruise. She cannot recollect exactly what happened. She thinks she used too much [employer]. The borders of this eruption are very distinct; however the family feels she may have injured her shoulder from a fall. She does not appear to have any changes in her baseline range of motion which is fair, as she can only raise her arm to 90 adduction.  Safety: They have made the modifications to the bathroom for  safety adding two grab bars and reversing the door so that she can clear the entrance with her walker.   Caregiving: We discussed her family's perception that she is declining somewhat in memory but definitely with stamina and ambulation. We discussed the course of heart failure as being capricious and this mandates a plan for multiple contingencies. Patient's cognition is also such that she may not fully appreciate her motor limitations We discussed their current care set up  which is hired caregiving plus family help during the day. She is needing someone at night now however. We discussed long-term care when the time came. They did not feel an assisted living facility would offer more than they have now available in home.  Covid vaccine: Patient is unwilling to take this vaccine. We discussed risk and benefits but  she is adamant that she will not take it. Education provided to maintain social distancing and limited visitation to prevent the spread.  Mobility: Uses walker and lift chair in den. Decreasing in function and stamina.  Having more DOE which limits her mobility.  Safety measures: Had modified bathroom, and put on some motion sensor lights. Uses walker at all times.    Depression: States she enjoys her caregiver and appears in a more positive mood today. Meds reviewed, taking citalopram.   3. Family /Caregiver/Community Supports: As above, caregiver and family help patient in her own home.   4. Cognitive / Functional decline: Good cognition but some forgetfulness RE events. She is becoming more functionally limited but seeks to stay independent in her own home.  5. Follow up  Palliative Care Visit: Palliative care will continue to follow for goals of care clarification and symptom management. Return 10 weeks or prn.  I spent 60 minutes providing this consultation,  from 1115 to 1215. More than 50% of the time in this consultation was spent coordinating communication.   HISTORY OF  PRESENT ILLNESS:  Cynthia Terry is a 86 y.o. year old female with multiple medical problems including valve disease, congestive heart failure, cognitive impairment, a fib, HTN, depression. Palliative Care was asked to follow this patient by consultation request of Kym Groom Guy Begin, * to help address advance care planning and goals of care. This is a follow up visit.  CODE STATUS: form with choices of do not resuscitate, limited interventions, use of antibiotics and IV fluids, no feeding tube  PPS: 40% HOSPICE ELIGIBILITY/DIAGNOSIS: no  PAST MEDICAL HISTORY: No past medical history on file.  SOCIAL HX:  Social History   Tobacco Use  . Smoking status: Never Smoker  . Smokeless tobacco: Never Used  Substance Use Topics  . Alcohol use: No    Alcohol/week: 0.0 standard drinks    ALLERGIES: No Known Allergies   PERTINENT MEDICATIONS:  Outpatient Encounter Medications as of 03/18/2019  Medication Sig  . allopurinol (ZYLOPRIM) 100 MG tablet Take by mouth.  Marland Kitchen allopurinol (ZYLOPRIM) 100 MG tablet   . cephALEXin (KEFLEX) 500 MG capsule Take 1 capsule (500 mg total) by mouth 3 (three) times daily.  . citalopram (CELEXA) 10 MG tablet Take by mouth.  . Coenzyme Q10-Vitamin E 100-100 MG-UNIT CAPS Take by mouth.  . furosemide (LASIX) 20 MG tablet TAKE ONE (1) TABLET EACH DAY  . Ipratropium-Albuterol (COMBIVENT) 20-100 MCG/ACT AERS respimat Inhale into the lungs.  Marland Kitchen levalbuterol (XOPENEX) 0.63 MG/3ML nebulizer solution Inhale into the lungs.  . meclizine (ANTIVERT) 25 MG tablet   . metoprolol (LOPRESSOR) 100 MG tablet TAKE ONE (1) TABLET BY MOUTH TWO (2) TIMES DAILY  . montelukast (SINGULAIR) 10 MG tablet TAKE ONE TABLET AT BEDTIME  . montelukast (SINGULAIR) 10 MG tablet   . omeprazole (PRILOSEC) 20 MG capsule Take by mouth.  Marland Kitchen omeprazole (PRILOSEC) 20 MG capsule   . Rivaroxaban (XARELTO) 15 MG TABS tablet TAKE ONE (1) TABLET BY MOUTH EVERY DAY WITH DINNER  . spironolactone (ALDACTONE) 25 MG  tablet TAKE ONE (1) TABLET BY MOUTH EVERY DAY   No facility-administered encounter medications on file as of 03/18/2019.    PHYSICAL EXAM / ROS:   Current and past weights: unavailable General: NAD, frail appearing, obese Cardiovascular: no chest pain reported, slight LE edema,  Pulmonary: no cough, + increased SOB, +DOE Abdomen: appetite good, denies constipation, continent of bowel GU: denies dysuria, continent of urine MSK:  no joint deformities, ambulatory with walker  Skin: appears to be a dermatitis, right shoulder skin area. Discolored, no breakdown. Neurological: Weakness, sleeps much  during day and reports insomnia at night.  Jason Coop, NP [facility]  COVID-19 PATIENT SCREENING TOOL  Person answering questions: _________caregiver__ Melinda________ _____   1.  Is the patient or any family member in the home showing any signs or symptoms regarding respiratory infection?               Person with Symptom- ___________________________  a. Fever  Yes___ No___          ___________________  b. Shortness of breath                                                    Yes___ No___          ___________________ c. Cough/congestion                                       Yes___  No___         ___________________ d. Body aches/pains                                                         Yes___ No___        ____________________ e. Gastrointestinal symptoms (diarrhea, nausea)           Yes___ No___        ____________________  2. Within the past 14 days, has anyone living in the home had any contact with someone with or under investigation for COVID-19?    Yes___ No_x_   Person __________________

## 2019-04-07 DIAGNOSIS — I495 Sick sinus syndrome: Secondary | ICD-10-CM | POA: Diagnosis not present

## 2019-04-07 DIAGNOSIS — I4819 Other persistent atrial fibrillation: Secondary | ICD-10-CM | POA: Diagnosis not present

## 2019-04-07 DIAGNOSIS — I10 Essential (primary) hypertension: Secondary | ICD-10-CM | POA: Diagnosis not present

## 2019-04-08 DIAGNOSIS — M79674 Pain in right toe(s): Secondary | ICD-10-CM | POA: Diagnosis not present

## 2019-04-08 DIAGNOSIS — G609 Hereditary and idiopathic neuropathy, unspecified: Secondary | ICD-10-CM | POA: Diagnosis not present

## 2019-04-08 DIAGNOSIS — M205X1 Other deformities of toe(s) (acquired), right foot: Secondary | ICD-10-CM | POA: Diagnosis not present

## 2019-04-08 DIAGNOSIS — M2032 Hallux varus (acquired), left foot: Secondary | ICD-10-CM | POA: Diagnosis not present

## 2019-04-08 DIAGNOSIS — M79675 Pain in left toe(s): Secondary | ICD-10-CM | POA: Diagnosis not present

## 2019-04-08 DIAGNOSIS — M2041 Other hammer toe(s) (acquired), right foot: Secondary | ICD-10-CM | POA: Diagnosis not present

## 2019-04-08 DIAGNOSIS — Q661 Congenital talipes calcaneovarus, unspecified foot: Secondary | ICD-10-CM | POA: Diagnosis not present

## 2019-04-08 DIAGNOSIS — B351 Tinea unguium: Secondary | ICD-10-CM | POA: Diagnosis not present

## 2019-04-22 DIAGNOSIS — J441 Chronic obstructive pulmonary disease with (acute) exacerbation: Secondary | ICD-10-CM | POA: Diagnosis not present

## 2019-04-22 DIAGNOSIS — K219 Gastro-esophageal reflux disease without esophagitis: Secondary | ICD-10-CM | POA: Diagnosis not present

## 2019-04-22 DIAGNOSIS — Z0000 Encounter for general adult medical examination without abnormal findings: Secondary | ICD-10-CM | POA: Diagnosis not present

## 2019-04-22 DIAGNOSIS — I10 Essential (primary) hypertension: Secondary | ICD-10-CM | POA: Diagnosis not present

## 2019-04-22 DIAGNOSIS — M1000 Idiopathic gout, unspecified site: Secondary | ICD-10-CM | POA: Diagnosis not present

## 2019-05-06 ENCOUNTER — Telehealth: Payer: Self-pay | Admitting: Primary Care

## 2019-05-06 NOTE — Telephone Encounter (Signed)
T/c from son that PCG quit unannounced this past weekend. He is seeking caregiving for 8-12 hours a day. He is also asking about hospice services. I will return call tomorrow to discuss.

## 2019-05-07 ENCOUNTER — Other Ambulatory Visit: Payer: Self-pay

## 2019-05-07 ENCOUNTER — Other Ambulatory Visit: Payer: PPO | Admitting: Primary Care

## 2019-05-07 DIAGNOSIS — Z515 Encounter for palliative care: Secondary | ICD-10-CM | POA: Diagnosis not present

## 2019-05-07 NOTE — Progress Notes (Signed)
[facility] Consult Note Telephone: [phone]  Fax: [phone]  TELEHEALTH VISIT STATEMENT Due to the COVID-19 crisis, this visit was done via telemedicine from my office. It was initiated and consented to by this patient and/or family.  PATIENT NAME: Cynthia Terry Cynthia Terry [phone] (home)  DOB: 1924-02-13 MRN: [id number]  PRIMARY CARE PROVIDER:   Valera Castle, MD, [place] [address] [phone]  REFERRING PROVIDER:  Valera Castle, [place],  [place] [address] [phone]  RESPONSIBLE PARTY:   Extended Emergency Contact Information Primary Emergency Contact: [place] I Address: [address] [place], [place] [address] Home Phone: [phone] Relation: None   ASSESSMENT AND RECOMMENDATIONS:   1. Advance Care Planning/Goals of Care: Goals include to maximize quality of life and symptom management.T/c from son to request discussion on caregiving. Their live in caregiver left on short notice. This was a person who they had a good relationship with and this departure has been disruptive and hurtful.  2. Symptom Management:   Caregiver Strain:  They have been put in a very difficult position as neither patient son or daughter  can take over full time care giving. We discussed agencies who supply care givers for a fee. We discussed medicaid structure and how to pay for assisted or long term care. I outlined the services found in long term care vs assisted living at their question. We discussed the  Pros and cons of these care giving choices. Family asked about hospice and I assessed her level of care and possible hospice dx. She has medication managed CHF at [facility] Stage 3. Treatment cessation was not a verbalized goal of care. She has moderate dementia, FAST score approx 6 c-d, able to take herself to bathroom. I outlined the requirement  for election of hospice to be a < 6 month assessed prognosis, which I do not believe she has.   Family will explore assisted living facilities. I suggested short term hiring of caregivers through an agency of their choice while they weigh options.  3. Family /Caregiver/Community Supports:  Daughter lives next door, son is POA. See above RE resources.  4. Cognitive / Functional decline: At baseline, FAST score 6 c-d, able to ambulate with walker in home.  5. Follow up Palliative Care Visit: Palliative care will continue to follow for goals of care clarification and symptom management. Return 2-3 weeks or prn.  I spent 25 minutes providing this consultation,  from 0930 to 0955 More than 50% of the time in this consultation was spent coordinating communication.   HISTORY OF PRESENT ILLNESS:  Cynthia Terry is a 86 y.o. year old female with multiple medical problems including moderate dementia, CHF, CAD, DM. Palliative Care was asked to follow this patient by consultation request of Kym Groom Guy Begin, * to help address advance care planning and goals of care. This is a follow up visit.  CODE STATUS:MOST  form with choices of do not resuscitate, limited interventions, use of antibiotics and IV fluids, no feeding tube  PPS: 40% HOSPICE ELIGIBILITY/DIAGNOSIS: TBD  PAST MEDICAL HISTORY: CHF, CAD, mod.dementia, DM  SOCIAL HX:  Social History   Tobacco Use   Smoking status: Never Smoker   Smokeless tobacco: Never Used  Substance Use Topics   Alcohol use: No    Alcohol/week: 0.0 standard drinks    ALLERGIES: No Known  Allergies   PERTINENT MEDICATIONS:  Outpatient Encounter Medications as of 05/07/2019  Medication Sig   albuterol (VENTOLIN HFA) 108 (90 Base) MCG/ACT inhaler Inhale into the lungs every 6 (six) hours as needed for wheezing or shortness of breath.   allopurinol (ZYLOPRIM) 100 MG tablet Take 100 mg by mouth.    allopurinol (ZYLOPRIM) 100 MG tablet    cephALEXin  (KEFLEX) 500 MG capsule Take 1 capsule (500 mg total) by mouth 3 (three) times daily.   Cholecalciferol (VITAMIN D-3) 25 MCG (1000 UT) CAPS Take by mouth.   citalopram (CELEXA) 10 MG tablet Take 10 mg by mouth daily.    Coenzyme Q10-Vitamin E 100-100 MG-UNIT CAPS Take by mouth.   furosemide (LASIX) 40 MG tablet Take by mouth daily.    Ipratropium-Albuterol (COMBIVENT) 20-100 MCG/ACT AERS respimat Inhale into the lungs.   levalbuterol (XOPENEX) 0.63 MG/3ML nebulizer solution Inhale into the lungs.   meclizine (ANTIVERT) 25 MG tablet    metoprolol tartrate (LOPRESSOR) 50 MG tablet Take by mouth 2 (two) times daily.    montelukast (SINGULAIR) 10 MG tablet Take 10 mg by mouth at bedtime.    montelukast (SINGULAIR) 10 MG tablet    omeprazole (PRILOSEC) 20 MG capsule Take 20 mg by mouth daily.    omeprazole (PRILOSEC) 20 MG capsule    Rivaroxaban (XARELTO) 15 MG TABS tablet TAKE ONE (1) TABLET BY MOUTH EVERY DAY WITH DINNER   spironolactone (ALDACTONE) 25 MG tablet Take 25 mg by mouth once.    No facility-administered encounter medications on file as of 05/07/2019.    PHYSICAL EXAM / ROS:   deferred  Jason Coop, NP [facility]

## 2019-05-27 ENCOUNTER — Other Ambulatory Visit: Payer: Self-pay

## 2019-05-27 ENCOUNTER — Other Ambulatory Visit: Payer: PPO | Admitting: Primary Care

## 2019-05-27 DIAGNOSIS — Z515 Encounter for palliative care: Secondary | ICD-10-CM | POA: Diagnosis not present

## 2019-05-27 NOTE — Progress Notes (Signed)
[facility] Consult Note Telephone: [phone]  Fax: [phone]  PATIENT NAME: Cynthia Terry [place] [address] [phone] (home)  DOB: July 05, 1924 MRN: [id number]  PRIMARY CARE PROVIDER:    Valera Castle, MD,  [place] [address] [phone]  REFERRING PROVIDER:   Valera Castle, [place],  [place] [address] [phone]  RESPONSIBLE PARTY:  Extended Emergency Contact Information Primary Emergency Contact: [place] I Address: [address] [place], [place] [address] Home Phone: [phone] Relation: Daughter Secondary Emergency Contact: [place], [place] Phone: [phone] Mobile Phone: [phone] Relation: Son Preferred language: English  I met with patient and family in the home.  ASSESSMENT AND RECOMMENDATIONS:   1. Advance Care Planning/Goals of Care: Goals include to maximize quality of life and symptom management. Discussed  Care giving needs and placement, staying in place, live in caregiver. Recently a caregiver, Rip Harbour, abandoned her post, moving her things out and then leaving saying she would be back after the weekend on April 6. She never returned. Discussed some sources of caregiver resources. They have a few numbers to call. Discussed finances and lifespan. Patient has become very dispondant about this deception, and looks visible more fatigued.   2. Symptom Management:   Safety: Discussed lifeline which she has, and they will get a cell due to phone outages.She can ambulate with walker and do some adls such as light meal prep and dressing with help. Needs help with bath. Has bathroom adaptation.  Depression: Very sad and tearful about the loss of caregiver and discussion about going to a facility. Recounts her husband living in a nursing home and not getting care. We also discussed her having a live in or barter rent, but  she does not want to change her current set up.  Caregiver crisis: Has not replaced the  Caregiver who left. Has not been able to get a call back due to lack of personnel. Gave name of [facility]. Caregiver Melinda left 3 weeks ago. They have also had some other suggestions.   3. Family /Caregiver/Community Supports:  Disussion RE caregiving, needs home help for personal care and alds and ialds.Very resistant to going to a nursing home/alf.   4. Cognitive / Functional decline: A and O x 3. Able to ambulate with walker with help. Needs lift chair to rise. Sleeps in recliner as cannot get oob (I).  5. Follow up Palliative Care Visit: Palliative care will continue to follow for goals of care clarification and symptom management. Return 8 weeks or prn.  I spent 60 minutes providing this consultation,  from 1200 to 1300. More than 50% of the time in this consultation was spent coordinating communication.   HISTORY OF PRESENT ILLNESS:  Cynthia Terry is a 86 y.o. year old female with multiple medical problems including immobility, obesity, CHF, mild cognitive impairment, a fib, HTN, depression. . Palliative Care was asked to follow this patient by consultation request of Kym Groom Guy Begin, * to help address advance care planning and goals of care. This is a follow up visit.  CODE STATUS: MOST form with choices of do not resuscitate, limited interventions, use of antibiotics and IV fluids, no feeding tube.  PPS: 40% HOSPICE ELIGIBILITY/DIAGNOSIS: no  PAST MEDICAL HISTORY: CHF, mild cognitive impairment, a fib, HTN, depression.  SOCIAL HX:  Social History   Tobacco Use  .  Smoking status: Never Smoker  . Smokeless tobacco: Never Used  Substance Use Topics  . Alcohol use: No    Alcohol/week: 0.0 standard drinks    ALLERGIES: No Known Allergies   PERTINENT MEDICATIONS:  Outpatient Encounter Medications as of 05/27/2019  Medication Sig  . albuterol (VENTOLIN HFA) 108 (90 Base) MCG/ACT  inhaler Inhale into the lungs every 6 (six) hours as needed for wheezing or shortness of breath.  . allopurinol (ZYLOPRIM) 100 MG tablet Take 100 mg by mouth.   Marland Kitchen allopurinol (ZYLOPRIM) 100 MG tablet   . cephALEXin (KEFLEX) 500 MG capsule Take 1 capsule (500 mg total) by mouth 3 (three) times daily.  . Cholecalciferol (VITAMIN D-3) 25 MCG (1000 UT) CAPS Take by mouth.  . citalopram (CELEXA) 10 MG tablet Take 10 mg by mouth daily.   . Coenzyme Q10-Vitamin E 100-100 MG-UNIT CAPS Take by mouth.  . furosemide (LASIX) 40 MG tablet Take by mouth daily.   . Ipratropium-Albuterol (COMBIVENT) 20-100 MCG/ACT AERS respimat Inhale into the lungs.  Marland Kitchen levalbuterol (XOPENEX) 0.63 MG/3ML nebulizer solution Inhale into the lungs.  . meclizine (ANTIVERT) 25 MG tablet   . metoprolol tartrate (LOPRESSOR) 50 MG tablet Take by mouth 2 (two) times daily.   . montelukast (SINGULAIR) 10 MG tablet Take 10 mg by mouth at bedtime.   . montelukast (SINGULAIR) 10 MG tablet   . omeprazole (PRILOSEC) 20 MG capsule Take 20 mg by mouth daily.   Marland Kitchen omeprazole (PRILOSEC) 20 MG capsule   . Rivaroxaban (XARELTO) 15 MG TABS tablet TAKE ONE (1) TABLET BY MOUTH EVERY DAY WITH DINNER  . spironolactone (ALDACTONE) 25 MG tablet Take 25 mg by mouth once.    No facility-administered encounter medications on file as of 05/27/2019.    PHYSICAL EXAM / ROS:   Current and past weights: unavailable General: NAD, frail appearing, obese Cardiovascular: no chest pain reported, SL LE edema  Pulmonary: no cough, no increased SOB, room air Abdomen: appetite good, denies  constipation, continent of bowel GU: denies dysuria, continent of urine MSK:  no joint or  ROM abnormalities, ambulatory with walker Skin: no rashes or wounds reported Neurological: Weakness, depression  Jason Coop, NP [facility]  COVID-19 PATIENT SCREENING TOOL  Person answering questions: ___________son [name]______ _____   1.  Is the patient or any family  member in the home showing any signs or symptoms regarding respiratory infection?               Person with Symptom- __________NA_________________  a. Fever                                                                          Yes___ No___          ___________________  b. Shortness of breath                                                    Yes___ No___          ___________________ c. Cough/congestion  Yes___  No___         ___________________ d. Body aches/pains                                                         Yes___ No___        ____________________ e. Gastrointestinal symptoms (diarrhea, nausea)           Yes___ No___        ____________________  2. Within the past 14 days, has anyone living in the home had any contact with someone with or under investigation for COVID-19?    Yes___ No_X_   Person __________________   

## 2019-06-16 DIAGNOSIS — L299 Pruritus, unspecified: Secondary | ICD-10-CM | POA: Diagnosis not present

## 2019-06-16 DIAGNOSIS — I10 Essential (primary) hypertension: Secondary | ICD-10-CM | POA: Diagnosis not present

## 2019-06-16 DIAGNOSIS — R238 Other skin changes: Secondary | ICD-10-CM | POA: Diagnosis not present

## 2019-07-09 DIAGNOSIS — M205X1 Other deformities of toe(s) (acquired), right foot: Secondary | ICD-10-CM | POA: Diagnosis not present

## 2019-07-09 DIAGNOSIS — M79674 Pain in right toe(s): Secondary | ICD-10-CM | POA: Diagnosis not present

## 2019-07-09 DIAGNOSIS — G609 Hereditary and idiopathic neuropathy, unspecified: Secondary | ICD-10-CM | POA: Diagnosis not present

## 2019-07-09 DIAGNOSIS — M79675 Pain in left toe(s): Secondary | ICD-10-CM | POA: Diagnosis not present

## 2019-07-09 DIAGNOSIS — M2041 Other hammer toe(s) (acquired), right foot: Secondary | ICD-10-CM | POA: Diagnosis not present

## 2019-07-09 DIAGNOSIS — M2032 Hallux varus (acquired), left foot: Secondary | ICD-10-CM | POA: Diagnosis not present

## 2019-07-09 DIAGNOSIS — B351 Tinea unguium: Secondary | ICD-10-CM | POA: Diagnosis not present

## 2019-07-09 DIAGNOSIS — Q661 Congenital talipes calcaneovarus, unspecified foot: Secondary | ICD-10-CM | POA: Diagnosis not present

## 2019-07-22 ENCOUNTER — Other Ambulatory Visit: Payer: PPO | Admitting: Primary Care

## 2019-07-22 ENCOUNTER — Other Ambulatory Visit: Payer: Self-pay

## 2019-07-22 DIAGNOSIS — I495 Sick sinus syndrome: Secondary | ICD-10-CM | POA: Diagnosis not present

## 2019-07-22 DIAGNOSIS — Z515 Encounter for palliative care: Secondary | ICD-10-CM | POA: Diagnosis not present

## 2019-07-22 NOTE — Progress Notes (Signed)
[facility] Consult Note Telephone: [phone]  Fax: [phone]  PATIENT NAME: Cynthia Terry [place] [address] [phone] (home)  DOB: 08/23/1924 MRN: [id number]  PRIMARY CARE PROVIDER:    Valera Castle, MD,  [place] [address] [phone]  REFERRING PROVIDER:   Valera Castle, [place],  [place] [address] [phone]  RESPONSIBLE PARTY:   Extended Emergency Contact Information Primary Emergency Contact: [place] I Address: [address] [place], [place] [address] Home Phone: [phone] Relation: Daughter Secondary Emergency Contact: [place], [place] Phone: [phone] Mobile Phone: [phone] Relation: Son Preferred language: English  I met with patient and family in home.  ASSESSMENT AND RECOMMENDATIONS:   1. Advance Care Planning/Goals of Care: Goals include to maximize quality of life and symptom management. MOST form reviewed with signature updates. No changes.   2. Symptom Management:   Pain: Arthritic pain, has stopped aspercreme due to bruising.  Acetaminophen 650 mg CR recommended, but states she is only using 500 mg occasionally. She may benefit from CR over hs.  Mobility: Denies falls, uses standard walker. States she cannot get out of the house very easily. She walks mainly from her chair in the den to kitchen and bathroom.Has benefited from some adaptations in the bathroom and now negotiates the bathroom more easily.   Caregiver: Has new caregiver. She is still affected by the sudden departure of her previous long term care giver. Discussed using a service for back up needs or if needed in a quick time frame.   3. Family /Caregiver/Community Supports:  Adult children are proximal, help with many adls and all iadls.   4. Cognitive / Functional decline: A and O x 3 with some forgetfulness. Endorses some depression.  Able to ambulate and self toilet. Needs assistance with bathing and meals.   5. Follow up Palliative Care Visit: Palliative care will continue to follow for goals of care clarification and symptom management. Return 12 weeks or prn.  I spent 40 minutes providing this consultation,  from 1115 to 1155. More than 50% of the time in this consultation was spent coordinating communication.   HISTORY OF PRESENT ILLNESS:  Cynthia Terry is a 86 y.o. year old female with multiple medical problems including OA, caregiver deficits. Palliative Care was asked to follow this patient by consultation request of Kym Groom Guy Begin, * to help address advance care planning and goals of care. This is a follow up visit.  CODE STATUS: DNR, MOST with limited scope, Use of abx, iv if indicated, no feeding tube.  PPS: 50%  HOSPICE ELIGIBILITY/DIAGNOSIS: no  PAST MEDICAL HISTORY: CHF, mild cognitive impairment, a fib, HTN, depression.  SOCIAL HX:  Social History   Tobacco Use   Smoking status: Never Smoker   Smokeless tobacco: Never Used  Substance Use Topics   Alcohol use: No    Alcohol/week: 0.0 standard drinks    ALLERGIES: No Known Allergies   PERTINENT MEDICATIONS:  Outpatient Encounter Medications as of 07/22/2019  Medication Sig   albuterol (VENTOLIN HFA) 108 (90 Base) MCG/ACT inhaler Inhale into the lungs every 6 (six) hours as needed for wheezing or shortness of breath.   allopurinol (ZYLOPRIM) 100 MG tablet Take 100 mg by mouth.    allopurinol (ZYLOPRIM) 100 MG tablet    cephALEXin (KEFLEX) 500 MG capsule Take 1 capsule (500 mg total) by mouth  3 (three) times daily.   Cholecalciferol (VITAMIN D-3) 25 MCG (1000 UT) CAPS Take by mouth.   citalopram (CELEXA) 10 MG tablet Take 10 mg by mouth daily.    Coenzyme Q10-Vitamin E 100-100 MG-UNIT CAPS Take by mouth.   furosemide (LASIX) 40 MG tablet Take by mouth daily.    Ipratropium-Albuterol (COMBIVENT) 20-100 MCG/ACT AERS respimat Inhale  into the lungs.   levalbuterol (XOPENEX) 0.63 MG/3ML nebulizer solution Inhale into the lungs.   meclizine (ANTIVERT) 25 MG tablet    metoprolol tartrate (LOPRESSOR) 50 MG tablet Take by mouth 2 (two) times daily.    montelukast (SINGULAIR) 10 MG tablet Take 10 mg by mouth at bedtime.    montelukast (SINGULAIR) 10 MG tablet    omeprazole (PRILOSEC) 20 MG capsule Take 20 mg by mouth daily.    omeprazole (PRILOSEC) 20 MG capsule    Rivaroxaban (XARELTO) 15 MG TABS tablet TAKE ONE (1) TABLET BY MOUTH EVERY DAY WITH DINNER   spironolactone (ALDACTONE) 25 MG tablet Take 25 mg by mouth once.    No facility-administered encounter medications on file as of 07/22/2019.    PHYSICAL EXAM / ROS:   Current and past weights: stable General: NAD, frail appearing,WNWD Cardiovascular: no chest pain reported, no edema  Pulmonary: no cough, no increased SOB, room air Abdomen: appetite good, denies constipation, continent of bowel GU: denies dysuria, continent of urine MSK:  + joint and ROM abnormalities, ambulatory with walker Skin: no rashes or wounds reported, has toe bandage to prevent blister. Neurological: Weakness, Bil peripheral neuropathy, general OA pain, mild cognitive impairment, but a and o x 3.    Jason Coop, NP [facility]  COVID-19 PATIENT SCREENING TOOL  Person answering questions: ____________Self____ _____   1.  Is the patient or any family member in the home showing any signs or symptoms regarding respiratory infection?               Person with Symptom- __________NA_________________  a. Fever                                                                          Yes___ No___          ___________________  b. Shortness of breath                                                    Yes___ No___          ___________________ c. Cough/congestion                                       Yes___  No___         ___________________ d. Body aches/pains  Yes___ No___        ____________________ e. Gastrointestinal symptoms (diarrhea, nausea)           Yes___ No___        ____________________  2. Within the past 14 days, has anyone living in the home had any contact with someone with or under investigation for COVID-19?    Yes___ No_X_   Person __________________

## 2019-08-01 ENCOUNTER — Emergency Department: Payer: PPO

## 2019-08-01 ENCOUNTER — Other Ambulatory Visit: Payer: Self-pay

## 2019-08-01 ENCOUNTER — Emergency Department
Admission: EM | Admit: 2019-08-01 | Discharge: 2019-08-01 | Disposition: A | Discharge status: Home / Self Care | Payer: PPO | Attending: Emergency Medicine | Admitting: Emergency Medicine

## 2019-08-01 DIAGNOSIS — M25562 Pain in left knee: Secondary | ICD-10-CM

## 2019-08-01 DIAGNOSIS — J449 Chronic obstructive pulmonary disease, unspecified: Secondary | ICD-10-CM | POA: Insufficient documentation

## 2019-08-01 DIAGNOSIS — Z79899 Other long term (current) drug therapy: Secondary | ICD-10-CM | POA: Insufficient documentation

## 2019-08-01 DIAGNOSIS — M7989 Other specified soft tissue disorders: Secondary | ICD-10-CM | POA: Diagnosis not present

## 2019-08-01 DIAGNOSIS — M7122 Synovial cyst of popliteal space [Baker], left knee: Secondary | ICD-10-CM | POA: Diagnosis not present

## 2019-08-01 DIAGNOSIS — I10 Essential (primary) hypertension: Secondary | ICD-10-CM | POA: Diagnosis not present

## 2019-08-01 DIAGNOSIS — Z7901 Long term (current) use of anticoagulants: Secondary | ICD-10-CM | POA: Insufficient documentation

## 2019-08-01 DIAGNOSIS — I110 Hypertensive heart disease with heart failure: Secondary | ICD-10-CM | POA: Diagnosis not present

## 2019-08-01 DIAGNOSIS — I509 Heart failure, unspecified: Secondary | ICD-10-CM | POA: Diagnosis not present

## 2019-08-01 DIAGNOSIS — R5381 Other malaise: Secondary | ICD-10-CM | POA: Diagnosis not present

## 2019-08-01 DIAGNOSIS — R2241 Localized swelling, mass and lump, right lower limb: Secondary | ICD-10-CM | POA: Diagnosis present

## 2019-08-01 DIAGNOSIS — R000 Tachycardia, unspecified: Secondary | ICD-10-CM | POA: Diagnosis not present

## 2019-08-01 DIAGNOSIS — I82890 Acute embolism and thrombosis of other specified veins: Secondary | ICD-10-CM | POA: Diagnosis not present

## 2019-08-01 DIAGNOSIS — R0902 Hypoxemia: Secondary | ICD-10-CM | POA: Diagnosis not present

## 2019-08-01 DIAGNOSIS — R609 Edema, unspecified: Secondary | ICD-10-CM | POA: Diagnosis not present

## 2019-08-01 DIAGNOSIS — M79605 Pain in left leg: Secondary | ICD-10-CM | POA: Diagnosis not present

## 2019-08-01 MED ORDER — HYDROCODONE-ACETAMINOPHEN 5-325 MG PO TABS
1.0000 | ORAL_TABLET | Freq: Once | ORAL | Status: AC
  Administered 2019-08-01: 1 via ORAL
  Filled 2019-08-01: qty 1

## 2019-08-01 MED ORDER — PANTOPRAZOLE SODIUM 40 MG PO TBEC: 40.0000 mg | DELAYED_RELEASE_TABLET | Freq: Every day | ORAL | Status: DC

## 2019-08-01 MED ORDER — METOPROLOL TARTRATE 50 MG PO TABS: 50.0000 mg | ORAL_TABLET | Freq: Two times a day (BID) | ORAL | Status: DC

## 2019-08-01 MED ORDER — ALLOPURINOL 100 MG PO TABS
100.0000 mg | ORAL_TABLET | Freq: Two times a day (BID) | ORAL | Status: DC
  Filled 2019-08-01 (×2): qty 1

## 2019-08-01 MED ORDER — CITALOPRAM HYDROBROMIDE 20 MG PO TABS: 10.0000 mg | ORAL_TABLET | Freq: Every day | ORAL | Status: DC

## 2019-08-01 MED ORDER — RIVAROXABAN 15 MG PO TABS: 15.0000 mg | ORAL_TABLET | Freq: Every day | ORAL | Status: DC

## 2019-08-01 MED ORDER — HYDROCODONE-ACETAMINOPHEN 5-325 MG PO TABS: 1.0000 | ORAL_TABLET | ORAL | 0 refills | Status: DC | PRN

## 2019-08-01 MED ORDER — MONTELUKAST SODIUM 10 MG PO TABS
10.0000 mg | ORAL_TABLET | Freq: Every day | ORAL | Status: DC
  Filled 2019-08-01: qty 1

## 2019-08-01 NOTE — ED Provider Notes (Addendum)
[facility] Emergency Department Provider Note   ____________________________________________   First MD Initiated Contact with Patient 08/01/19 [phone]     (approximate)  I have reviewed the triage vital signs and the nursing notes.   HISTORY  Chief Complaint Leg Swelling    HPI Cynthia Terry is a 86 y.o. female with past medical history of permanent atrial fibrillation on Xarelto, hypertension, CHF, and COPD who presents to the ED complaining of knee pain.  Patient reports that she woke up this morning with pain in her left knee and an area of swelling just behind that knee.  She typically gets around with a walker, but had significant pain when attempting to bear weight on this left leg and was unable to ambulate with a walker.  She denies any recent falls or other trauma to this leg.  Pain extends down into her calf from her posterior knee, but she denies any history of DVT/PE.  She has otherwise been feeling well with no fevers, cough, chest pain, or shortness of breath.  She did not take anything for this prior to arrival.        Past Medical History:  Diagnosis Date   Atrial fibrillation ([place])    Hypertension     There are no problems to display for this patient.   History reviewed. No pertinent surgical history.  Prior to Admission medications   Medication Sig Start Date End Date Taking? Authorizing Provider  albuterol (VENTOLIN HFA) 108 (90 Base) MCG/ACT inhaler Inhale into the lungs every 6 (six) hours as needed for wheezing or shortness of breath.    [provider]  allopurinol (ZYLOPRIM) 100 MG tablet Take 100 mg by mouth.  02/24/15   [provider]  allopurinol (ZYLOPRIM) 100 MG tablet  02/24/15   [provider]  cephALEXin (KEFLEX) 500 MG capsule Take 1 capsule (500 mg total) by mouth 3 (three) times daily. Patient not taking: Reported on 08/01/2019 03/09/15   Trula Slade, DPM  Cholecalciferol (VITAMIN  D-3) 25 MCG (1000 UT) CAPS Take by mouth.    [provider]  citalopram (CELEXA) 10 MG tablet Take 10 mg by mouth daily.  10/28/14   [provider]  Coenzyme Q10-Vitamin E 100-100 MG-UNIT CAPS Take by mouth. 06/04/07   [provider]  furosemide (LASIX) 40 MG tablet Take by mouth daily.  09/09/14 07/10/15  [provider]  HYDROcodone-acetaminophen (NORCO/VICODIN) 5-325 MG tablet Take 1 tablet by mouth every 4 (four) hours as needed for moderate pain. 08/01/19 07/31/20  Blake Divine, MD  Ipratropium-Albuterol (COMBIVENT) 20-100 MCG/ACT AERS respimat Inhale into the lungs. 07/13/14 07/13/15  [provider]  levalbuterol Penne Lash) 0.63 MG/3ML nebulizer solution Inhale into the lungs. 04/30/14 04/30/15  [provider]  meclizine (ANTIVERT) 25 MG tablet  02/24/15   [provider]  metoprolol tartrate (LOPRESSOR) 50 MG tablet Take by mouth 2 (two) times daily.  01/27/15   [provider]  montelukast (SINGULAIR) 10 MG tablet Take 10 mg by mouth at bedtime.  09/21/14   [provider]  montelukast (SINGULAIR) 10 MG tablet  02/05/15   [provider]  omeprazole (PRILOSEC) 20 MG capsule Take 20 mg by mouth daily.  11/09/14   [provider]  omeprazole (PRILOSEC) 20 MG capsule  03/05/15   [provider]  Rivaroxaban (XARELTO) 15 MG TABS tablet Take 15 mg by mouth daily with supper.  12/16/14   [provider]  spironolactone (ALDACTONE)  25 MG tablet Take 25 mg by mouth once.  08/05/14 08/05/15  [provider]    Allergies Amlodipine and Clonidine derivatives  History reviewed. No pertinent family history.  Social History Social History   Tobacco Use   Smoking status: Never Smoker   Smokeless tobacco: Never Used  Substance Use Topics   Alcohol use: No    Alcohol/week: 0.0 standard drinks   Drug use: No    Review of Systems  Constitutional: No fever/chills Eyes: No visual  changes. ENT: No sore throat. Cardiovascular: Denies chest pain. Respiratory: Denies shortness of breath. Gastrointestinal: No abdominal pain.  No nausea, no vomiting.  No diarrhea.  No constipation. Genitourinary: Negative for dysuria. Musculoskeletal: Negative for back pain.  Positive for left leg pain and swelling. Skin: Negative for rash. Neurological: Negative for headaches, focal weakness or numbness.  ____________________________________________   PHYSICAL EXAM:  VITAL SIGNS: ED Triage Vitals  Enc Vitals Group     BP 08/01/19 0836 (!) 153/67     Pulse Rate 08/01/19 0836 85     Resp 08/01/19 0836 18     Temp 08/01/19 0836 98.4 F (36.9 C)     Temp Source 08/01/19 0836 Oral     SpO2 08/01/19 0836 96 %     Weight 08/01/19 0837 190 lb (86.2 kg)     Height 08/01/19 0837 5\' 4"  (1.626 m)     Head Circumference --      Peak Flow --      Pain Score 08/01/19 0836 0     Pain Loc --      Pain Edu? --      Excl. in [place]? --     Constitutional: Alert and oriented. Eyes: Conjunctivae are normal. Head: Atraumatic. Nose: No congestion/rhinnorhea. Mouth/Throat: Mucous membranes are moist. Neck: Normal ROM Cardiovascular: Normal rate, irregularly irregular rhythm. Grossly normal heart sounds. Respiratory: Normal respiratory effort.  No retractions. Lungs CTAB. Gastrointestinal: Soft and nontender. No distention. Genitourinary: deferred Musculoskeletal: Diffuse tenderness to left knee greatest at posterior portion, mild limitation range of motion due to pain.  Area of swelling to posterior knee most consistent with Baker's cyst. Neurologic:  Normal speech and language. No gross focal neurologic deficits are appreciated. Skin:  Skin is warm, dry and intact. No rash noted. Psychiatric: Mood and affect are normal. Speech and behavior are normal.  ____________________________________________   LABS (all labs ordered are listed, but only abnormal results are displayed)  Labs  Reviewed - No data to display ____________________________________________  EKG  ED ECG REPORT I, Blake Divine, the attending physician, personally viewed and interpreted this ECG.   Date: 08/01/2019  EKG Time: 8:40  Rate: 81  Rhythm: atrial fibrillation, rate 81  Axis: LAD  Intervals:left bundle branch block  ST&T Change: None, negative S[place]ossa   PROCEDURES  Procedure(s) performed (including Critical Care):  Procedures   ____________________________________________   INITIAL IMPRESSION / ASSESSMENT AND PLAN / ED COURSE       86 year old female with past medical history of permanent A. fib on Xarelto, CHF, COPD, and hypertension presents to the ED complaining of pain and swelling to her left lower extremity, primarily behind her left knee.  She is neurovascularly intact to this left lower extremity with 2+ DP pulses bilaterally.  She denies any recent trauma and low suspicion for fracture but we will assess with x-ray.  We will also check ultrasound for evidence of DVT.  No evidence of septic arthritis or cellulitis and exam appears most consistent  with Baker's cyst likely due to osteoarthritis.  We will treat her pain with Norco and reassess.  EKG shows rate controlled atrial fibrillation with no acute changes, patient denies any chest pain or shortness of breath to suggest CHF exacerbation.  X-rays are negative for acute process, ultrasound is negative for DVT but does show what appears to be a Baker's cyst with heterogenous component.  Given she is anticoagulated, she likely has an element of bleeding into her Baker's cyst.  Case was discussed with Dr. Gaspar Bidding of orthopedics, who states that only acute therapeutic option would be steroid injection, however this is unlikely to be effective due to her primary issue is at the Baker's cyst.  Risks of worsening bleeding with injection seem to outweigh the benefits of this procedure.  Patient continuing to have difficulty ambulating  using walker despite pain medication, we will have patient evaluated by physical therapy and social work for potential home health versus SNF placement.  Patient evaluated by social work, who was arranged for patient to receive PT and additional nursing care at home.  She also has additional resources available through her insurance for private aide which she was informed of by social work.  She is appropriate for discharge home and we will prescribe short course of Norco as well as referral for follow-up with orthopedics.  Patient and son agree with plan.      ____________________________________________   FINAL CLINICAL IMPRESSION(S) / ED DIAGNOSES  Final diagnoses:  Acute pain of left knee  Synovial cyst of left popliteal space     ED Discharge Orders         Ordered    HYDROcodone-acetaminophen (NORCO/VICODIN) 5-325 MG tablet  Every 4 hours PRN     Discontinue  Reprint     08/01/19 1208           Note:  This document was prepared using Dragon voice recognition software and may include unintentional dictation errors.   Blake Divine, MD 08/01/19 1117    Blake Divine, MD 08/01/19 1210

## 2019-08-01 NOTE — TOC Transition Note (Addendum)
Transition of Care [facility]) - CM/SW Discharge Note   Patient Details  Name: Cynthia Terry MRN: [id number] Date of Birth: 07/19/1924  Transition of Care [facility]) CM/SW Contact:  Anselm Pancoast, RN Phone Number: 08/01/2019, 12:01 PM   Clinical Narrative:    Confirmed with Corene Cornea @ [facility] accepted for nursing and physical therapy. RN CM updated EDP and patient with no other needs or concerns. Reviewed possible custodial care assistance through [facility] however patient not meeting criteria at this time as it requires inpatient stay within 30 days of request. TOC signing off.      Barriers to Discharge: No Barriers Identified   Patient Goals and CMS Choice Patient states their goals for this hospitalization and ongoing recovery are:: Get home with home health      Discharge Placement                       Discharge Plan and Services                                     Social Determinants of Health (SDOH) Interventions     Readmission Risk Interventions No flowsheet data found.

## 2019-08-01 NOTE — ED Triage Notes (Addendum)
Pt arrives via [facility] from home for reports of left leg swelling and pain that started this morning upon awakening. Pt unable to bear weight on the leg and normally uses a walker. Pt A&Ox4 and in NAD, skin warm and dry. Pt has yellow DNR form and pink MOST form

## 2019-08-01 NOTE — TOC Progression Note (Signed)
Transition of Care [facility]) - Progression Note    Patient Details  Name: Cynthia Terry MRN: [id number] Date of Birth: Nov 21, 1924  Transition of Care [facility]) CM/SW [place], RN Phone Number: 08/01/2019, 11:48 AM  Clinical Narrative:    Outreach to Corene Cornea with [facility] and [place] at [facility] for availability.   [place] advised [facility] is not available.     Expected Discharge Plan: [place] Barriers to Discharge: No Barriers Identified  Expected Discharge Plan and Services Expected Discharge Plan: [place] [place] arrangements for the past 2 months: Single Family Home                                       Social Determinants of Health (SDOH) Interventions    Readmission Risk Interventions No flowsheet data found.

## 2019-08-01 NOTE — ED Notes (Addendum)
Pt attempted to ambulate in room with walker, pt took 2 steps and would not walk any further stating her left leg was hurting to bad to bear weight on. Pt assisted back to bed by this RN. Pt in NAD at this time. Dr. Charna Archer made aware.

## 2019-08-01 NOTE — ED Notes (Signed)
This RN offered pt the medications that were ordered and pt states, "The social worker talked like I would be leaving soon, I will just wait until I get home". Meds not given at this time.

## 2019-08-01 NOTE — TOC Initial Note (Signed)
Transition of Care [facility]) - Initial/Assessment Note    Patient Details  Name: Cynthia Terry MRN: [id number] Date of Birth: 06/28/1924  Transition of [facility]) CM/SW Contact:    Anselm Pancoast, RN Phone Number: 08/01/2019, 11:43 AM  Clinical Narrative:                 Spoke to patient at bedside. Patient has strong support system with daughter living next door and son who is 4 miles away. Patient has private pay caregivers from 8-4pm daily. Son transports to all appointments and manages medication with pill box. Patient states she had experience with home health with her husband and hospice services but does not have a preference for agencies. Patient will need nursing and physical therapy for home health. Patient with no other needs or concerns.   Expected Discharge Plan: [place] Barriers to Discharge: No Barriers Identified   Patient Goals and CMS Choice Patient states their goals for this hospitalization and ongoing recovery are:: Get home with home health      Expected Discharge Plan and Services Expected Discharge Plan: [place]       Living arrangements for the past 2 months: Single Family Home                                      Prior Living Arrangements/Services Living arrangements for the past 2 months: Single Family Home Lives with:: Self (daughter lives next door and son lives 4 miles away-has paid caregivers from 8-4 every day) Patient language and need for interpreter reviewed:: Yes Do you feel safe going back to the place where you live?: Yes      Need for Family Participation in Patient Care: Yes (Comment) Care giver support system in place?: Yes (comment) Current home services: DME (walker, wheelchair, grab bars) Criminal Activity/Legal Involvement Pertinent to Current Situation/Hospitalization: No - Comment as needed  Activities of Daily Living      Permission Sought/Granted Permission sought to share  information with : Case Manager Permission granted to share information with : Yes, Verbal Permission Granted  Share Information with NAME: TOC Department           Emotional Assessment Appearance:: Appears stated age Attitude/Demeanor/Rapport: Ambitious, Engaged Affect (typically observed): Accepting Orientation: : Oriented to Self, Oriented to Place, Oriented to  Time, Oriented to Situation Alcohol / Substance Use: Never Used Psych Involvement: No (comment)  Admission diagnosis:  Leg Pain  There are no problems to display for this patient.  PCP:  Valera Castle, MD Pharmacy:  No Pharmacies Listed    Social Determinants of Health (SDOH) Interventions    Readmission Risk Interventions No flowsheet data found.

## 2019-08-01 NOTE — ED Notes (Signed)
U/S at bedside. Son at bedside as well

## 2019-08-03 DIAGNOSIS — Z905 Acquired absence of kidney: Secondary | ICD-10-CM | POA: Diagnosis not present

## 2019-08-03 DIAGNOSIS — I447 Left bundle-branch block, unspecified: Secondary | ICD-10-CM | POA: Diagnosis not present

## 2019-08-03 DIAGNOSIS — Z7901 Long term (current) use of anticoagulants: Secondary | ICD-10-CM | POA: Diagnosis not present

## 2019-08-03 DIAGNOSIS — I4821 Permanent atrial fibrillation: Secondary | ICD-10-CM | POA: Diagnosis not present

## 2019-08-03 DIAGNOSIS — J449 Chronic obstructive pulmonary disease, unspecified: Secondary | ICD-10-CM | POA: Diagnosis not present

## 2019-08-03 DIAGNOSIS — Z9181 History of falling: Secondary | ICD-10-CM | POA: Diagnosis not present

## 2019-08-03 DIAGNOSIS — I110 Hypertensive heart disease with heart failure: Secondary | ICD-10-CM | POA: Diagnosis not present

## 2019-08-03 DIAGNOSIS — I509 Heart failure, unspecified: Secondary | ICD-10-CM | POA: Diagnosis not present

## 2019-08-03 DIAGNOSIS — M7122 Synovial cyst of popliteal space [Baker], left knee: Secondary | ICD-10-CM | POA: Diagnosis not present

## 2019-08-03 DIAGNOSIS — Z993 Dependence on wheelchair: Secondary | ICD-10-CM | POA: Diagnosis not present

## 2019-08-06 DIAGNOSIS — R262 Difficulty in walking, not elsewhere classified: Secondary | ICD-10-CM | POA: Diagnosis not present

## 2019-08-06 DIAGNOSIS — M259 Joint disorder, unspecified: Secondary | ICD-10-CM | POA: Diagnosis not present

## 2019-08-06 DIAGNOSIS — M7120 Synovial cyst of popliteal space [Baker], unspecified knee: Secondary | ICD-10-CM | POA: Diagnosis not present

## 2019-08-06 DIAGNOSIS — M79605 Pain in left leg: Secondary | ICD-10-CM | POA: Diagnosis not present

## 2019-10-13 DIAGNOSIS — Q661 Congenital talipes calcaneovarus, unspecified foot: Secondary | ICD-10-CM | POA: Diagnosis not present

## 2019-10-13 DIAGNOSIS — B351 Tinea unguium: Secondary | ICD-10-CM | POA: Diagnosis not present

## 2019-10-13 DIAGNOSIS — M205X1 Other deformities of toe(s) (acquired), right foot: Secondary | ICD-10-CM | POA: Diagnosis not present

## 2019-10-21 DIAGNOSIS — I495 Sick sinus syndrome: Secondary | ICD-10-CM | POA: Diagnosis not present

## 2019-10-27 DIAGNOSIS — I10 Essential (primary) hypertension: Secondary | ICD-10-CM | POA: Diagnosis not present

## 2019-11-04 ENCOUNTER — Other Ambulatory Visit: Payer: Self-pay

## 2019-11-04 ENCOUNTER — Encounter: Payer: Self-pay | Admitting: Primary Care

## 2019-11-04 ENCOUNTER — Other Ambulatory Visit: Payer: PPO | Admitting: Primary Care

## 2019-11-04 DIAGNOSIS — I4819 Other persistent atrial fibrillation: Secondary | ICD-10-CM

## 2019-11-04 DIAGNOSIS — J449 Chronic obstructive pulmonary disease, unspecified: Secondary | ICD-10-CM

## 2019-11-04 DIAGNOSIS — Z515 Encounter for palliative care: Secondary | ICD-10-CM

## 2019-11-04 NOTE — Progress Notes (Signed)
[facility] Palliative Care Consult Note Telephone: [phone]  Fax: [phone]  PATIENT NAME: Cynthia Terry [place] [address] [phone] (home)  DOB: [birth year] MRN: [id number]  PRIMARY CARE PROVIDER:    Valera Castle, MD,  [place] [address] [phone]  REFERRING PROVIDER:   Valera Castle, [place] [address] [place],  [place] [address] [phone]  RESPONSIBLE PARTY:   Extended Emergency Contact Information Primary Emergency Contact: Marshall I Address: [address] [place], [place] [address] Home Phone: [phone] Relation: Daughter Secondary Emergency Contact: [place], [place] Phone: [phone] Mobile Phone: [phone] Relation: Son Preferred language: English  I met face to face with patient and caregiver in home.  ASSESSMENT AND RECOMMENDATIONS:   1. Advance Care Planning/Goals of Care: Goals include to maximize quality of life and symptom management. DNR and MOST on record.  2. Symptom Management:    Dyspnea: finishing a neb rx.  Has rescue inhaler for prn.Endorses productive cough in AM.Lungs clear all fields. Denies using montelukast currently.   Pain:  In shoulders, uses tylenol for  arthritis prn.  States this is limiting her ROM. She can do some adls She does not want to exercise.   Caregiving strain: Has a new caregiver that appears to be working well.   3. Follow up Palliative Care Visit: Palliative care will continue to follow for goals of care clarification and symptom management. Return 8-10 weeks or prn.  4. Family /Caregiver/Community Supports: Has 2 adult children nearby who help with care. New hired caregiver x several month, this appears to be working well. Has PCP and orthopedics as consult.   5. Cognitive / Functional decline: Alert and oriented x 3. Needs assistance with adls, iadls.  I spent 60 minutes providing this  consultation,  from 1100 to 1200. More than 50% of the time in this consultation was spent coordinating communication.   CHIEF COMPLAINT: DOE   HISTORY OF PRESENT ILLNESS:  Cynthia Terry is a 86 y.o. year old female with multiple medical problems including OA, COPD,  Caregiver needs Palliative Care was asked to follow this patient by consultation request of Kym Groom Guy Begin, * to help address advance care planning and goals of care. This is a follow up visit.  CODE STATUS: DNR, MOST with limited scope, Use of abx, iv if indicated, no feeding tube.  PPS: 50%  HOSPICE ELIGIBILITY/DIAGNOSIS: no  PHYSICAL EXAM / ROS:   Current and past weights: unknown General: NAD, frail appearing, obese Cardiovascular: no chest pain reported, no  LE edema  Pulmonary: + cough, no increased SOB, room air, 92% Abdomen: appetite good, denies constipation, continent of bowel GU: denies dysuria, continent of urine MSK:  + joint and ROM abnormalities, ambulatory with walker. Skin: no rashes or wounds reported, endorses dry skin Neurological: Weakness, denies pain, endorses occ  insomnia, A and O x 3  CURRENT PROBLEM LIST:  Patient Active Problem List   Diagnosis Date Noted   Moderate tricuspid insufficiency 07/18/2018   SOBOE (shortness of breath on exertion) 04/11/2018   Persistent atrial fibrillation ([place]) 04/12/2017   Anxiety 02/27/2017   Gastroesophageal reflux disease without esophagitis 02/27/2017   COPD (chronic obstructive pulmonary disease) ([place]) 10/18/2016   Acute idiopathic gout 09/18/2016   Chronic pulmonary hypertension ([place]) 09/13/2015   Vitamin D insufficiency 08/03/2011   Arthritis 09/21/2010   Polymyalgia rheumatica ([place]) 09/21/2010  PAST MEDICAL HISTORY:  Past Medical History:  Diagnosis Date   Atrial fibrillation ([place])    Hypertension     SOCIAL HX:  Social History   Tobacco Use   Smoking status: Never Smoker   Smokeless tobacco: Never Used    Substance Use Topics   Alcohol use: No    Alcohol/week: 0.0 standard drinks   FAMILY HX:  Family History  Problem Relation Age of Onset   Heart disease Mother    Renal cancer Father    Heart disease Brother     ALLERGIES:  Allergies  Allergen Reactions   Amlodipine     swelling   Clonidine Derivatives     dizziness     PERTINENT MEDICATIONS:  Outpatient Encounter Medications as of 11/04/2019  Medication Sig   albuterol (VENTOLIN HFA) 108 (90 Base) MCG/ACT inhaler Inhale into the lungs every 6 (six) hours as needed for wheezing or shortness of breath.   allopurinol (ZYLOPRIM) 100 MG tablet Take 100 mg by mouth.    allopurinol (ZYLOPRIM) 100 MG tablet    cephALEXin (KEFLEX) 500 MG capsule Take 1 capsule (500 mg total) by mouth 3 (three) times daily. (Patient not taking: Reported on 08/01/2019)   Cholecalciferol (VITAMIN D-3) 25 MCG (1000 UT) CAPS Take by mouth.   citalopram (CELEXA) 10 MG tablet Take 10 mg by mouth daily.    Coenzyme Q10-Vitamin E 100-100 MG-UNIT CAPS Take by mouth.   furosemide (LASIX) 40 MG tablet Take by mouth daily.    HYDROcodone-acetaminophen (NORCO/VICODIN) 5-325 MG tablet Take 1 tablet by mouth every 4 (four) hours as needed for moderate pain.   Ipratropium-Albuterol (COMBIVENT) 20-100 MCG/ACT AERS respimat Inhale into the lungs.   levalbuterol (XOPENEX) 0.63 MG/3ML nebulizer solution Inhale into the lungs.   meclizine (ANTIVERT) 25 MG tablet    metoprolol tartrate (LOPRESSOR) 50 MG tablet Take by mouth 2 (two) times daily.    montelukast (SINGULAIR) 10 MG tablet Take 10 mg by mouth at bedtime.    montelukast (SINGULAIR) 10 MG tablet    omeprazole (PRILOSEC) 20 MG capsule Take 20 mg by mouth daily.    omeprazole (PRILOSEC) 20 MG capsule    Rivaroxaban (XARELTO) 15 MG TABS tablet Take 15 mg by mouth daily with supper.    spironolactone (ALDACTONE) 25 MG tablet Take 25 mg by mouth once.    No facility-administered encounter  medications on file as of 11/04/2019.     Jason Coop, NP , DNP, MPH, [facility]  COVID-19 PATIENT SCREENING TOOL  Person answering questions: ____________self______ _____   1.  Is the patient or any family member in the home showing any signs or symptoms regarding respiratory infection?               Person with Symptom- __________NA_________________  a. Fever                                                                          Yes___ No___          ___________________  b. Shortness of breath  Yes___ No___          ___________________ °c. Cough/congestion                                       Yes___  No___         ___________________ °d. Body aches/pains                                                         Yes___ No___        ____________________ °e. Gastrointestinal symptoms (diarrhea, nausea)           Yes___ No___        ____________________ ° °2. Within the past 14 days, has anyone living in the home had any contact with someone with or under investigation for COVID-19?    °Yes___ No_X_   Person __________________ ° ° °

## 2019-11-24 ENCOUNTER — Other Ambulatory Visit: Payer: Self-pay

## 2019-11-24 ENCOUNTER — Emergency Department
Admission: EM | Admit: 2019-11-24 | Discharge: 2019-11-24 | Disposition: A | Discharge status: Home / Self Care | Payer: PPO | Attending: Emergency Medicine | Admitting: Emergency Medicine

## 2019-11-24 ENCOUNTER — Encounter: Payer: Self-pay | Admitting: Emergency Medicine

## 2019-11-24 DIAGNOSIS — R040 Epistaxis: Secondary | ICD-10-CM

## 2019-11-24 DIAGNOSIS — Z79899 Other long term (current) drug therapy: Secondary | ICD-10-CM | POA: Diagnosis not present

## 2019-11-24 DIAGNOSIS — I4811 Longstanding persistent atrial fibrillation: Secondary | ICD-10-CM | POA: Insufficient documentation

## 2019-11-24 DIAGNOSIS — R58 Hemorrhage, not elsewhere classified: Secondary | ICD-10-CM | POA: Diagnosis not present

## 2019-11-24 DIAGNOSIS — R450 Nervousness: Secondary | ICD-10-CM | POA: Diagnosis not present

## 2019-11-24 DIAGNOSIS — R0902 Hypoxemia: Secondary | ICD-10-CM | POA: Diagnosis not present

## 2019-11-24 DIAGNOSIS — I272 Pulmonary hypertension, unspecified: Secondary | ICD-10-CM | POA: Insufficient documentation

## 2019-11-24 DIAGNOSIS — I10 Essential (primary) hypertension: Secondary | ICD-10-CM | POA: Diagnosis not present

## 2019-11-24 DIAGNOSIS — Z7901 Long term (current) use of anticoagulants: Secondary | ICD-10-CM | POA: Diagnosis not present

## 2019-11-24 MED ORDER — OXYMETAZOLINE HCL 0.05 % NA SOLN
1.0000 | Freq: Once | NASAL | Status: AC
  Administered 2019-11-24: 1 via NASAL
  Filled 2019-11-24: qty 30

## 2019-11-24 MED ORDER — OXYMETAZOLINE HCL 0.05 % NA SOLN: 1.0000 | Freq: Two times a day (BID) | NASAL | 0 refills | Status: AC

## 2019-11-24 NOTE — ED Provider Notes (Signed)
[facility] Emergency Department Provider Note ____________________________________________   First MD Initiated Contact with Patient 11/24/19 1058     (approximate)  I have reviewed the triage vital signs and the nursing notes.   HISTORY  Chief Complaint Epistaxis    HPI Cynthia Terry is a 86 y.o. female with PMH as noted below including atrial fibrillation on Xarelto who presents with a nosebleed, acute onset today around 4 AM and initially dripping a small amount of blood.  It came from the left nostril.  It subsided with pressure, but then restarted later this morning and was more copious.  It stopped right around the time EMS arrived.  The patient states that she felt anxious and developed a mild headache which she has when she is anxious.  She denies any chest pain, shortness of breath, lightheadedness, or other acute symptoms.  The patient states that she gets frequent nosebleeds although more commonly on the right than on the left.  Her last nosebleed was a few months ago.  Past Medical History:  Diagnosis Date  . Atrial fibrillation ([place])   . Hypertension     Patient Active Problem List   Diagnosis Date Noted  . Moderate tricuspid insufficiency 07/18/2018  . SOBOE (shortness of breath on exertion) 04/11/2018  . Persistent atrial fibrillation ([place]) 04/12/2017  . Anxiety 02/27/2017  . Gastroesophageal reflux disease without esophagitis 02/27/2017  . COPD (chronic obstructive pulmonary disease) ([place]) 10/18/2016  . Acute idiopathic gout 09/18/2016  . Chronic pulmonary hypertension ([place]) 09/13/2015  . Vitamin D insufficiency 08/03/2011  . Arthritis 09/21/2010  . Polymyalgia rheumatica ([place]) 09/21/2010    History reviewed. No pertinent surgical history.  Prior to Admission medications   Medication Sig Start Date End Date Taking? Authorizing Provider  allopurinol (ZYLOPRIM) 100 MG tablet Take 100 mg by mouth.  02/24/15  Yes [provider]  Cholecalciferol (VITAMIN D-3) 25 MCG (1000 UT) CAPS Take 2,000 Units by mouth.    Yes [provider]  citalopram (CELEXA) 10 MG tablet Take 10 mg by mouth daily.  10/28/14  Yes [provider]  furosemide (LASIX) 40 MG tablet Take 40 mg by mouth daily.  09/09/14 11/24/19 Yes [provider]  metoprolol tartrate (LOPRESSOR) 50 MG tablet Take 50 mg by mouth 2 (two) times daily.  01/27/15  Yes [provider]  montelukast (SINGULAIR) 10 MG tablet Take 10 mg by mouth at bedtime.  02/05/15  Yes [provider]  omeprazole (PRILOSEC) 20 MG capsule Take 20 mg by mouth daily.  11/09/14  Yes [provider]  Rivaroxaban (XARELTO) 15 MG TABS tablet Take 15 mg by mouth daily with supper.  12/16/14  Yes [provider]  spironolactone (ALDACTONE) 25 MG tablet Take 25 mg by mouth once.  08/05/14 11/24/19 Yes [provider]  vitamin B-12 (CYANOCOBALAMIN) 1000 MCG tablet Take 1,000 mcg by mouth daily.   Yes [provider]  albuterol (VENTOLIN HFA) 108 (90 Base) MCG/ACT inhaler Inhale into the lungs every 6 (six) hours as needed for wheezing or shortness of breath.    [provider]  ammonium lactate (AMLACTIN) 12 % cream Apply 1 application topically 2 (two) times daily. 06/18/19   [provider]  oxymetazoline (AFRIN) 0.05 % nasal spray Place 1 spray into left nostril 2 (two) times daily for 3 days. 11/24/19 11/27/19  Arta Silence, MD    Allergies Amlodipine and Clonidine derivatives  Family History  Problem Relation Age of Onset  .  Heart disease Mother   . Renal cancer Father   . Heart disease Brother     Social History Social History   Tobacco Use  . Smoking status: Never Smoker  . Smokeless tobacco: Never Used  Substance Use Topics  . Alcohol use: No    Alcohol/week: 0.0 standard drinks  . Drug use: No    Review of Systems  Constitutional: No fever. Eyes: No redness. ENT: Positive  for epistaxis. Cardiovascular: Denies chest pain. Respiratory: Denies shortness of breath. Gastrointestinal: No vomiting. Genitourinary: Negative for hematuria. Musculoskeletal: Negative for back pain. Skin: Negative for rash. Neurological: Positive for mild headache.   ____________________________________________   PHYSICAL EXAM:  VITAL SIGNS: ED Triage Vitals  Enc Vitals Group     BP 11/24/19 1100 (!) 146/57     Pulse Rate 11/24/19 1100 66     Resp 11/24/19 1055 20     Temp 11/24/19 1101 98.4 F (36.9 C)     Temp Source 11/24/19 1101 Oral     SpO2 11/24/19 1055 96 %     Weight 11/24/19 1059 190 lb 0.6 oz (86.2 kg)     Height --      Head Circumference --      Peak Flow --      Pain Score --      Pain Loc --      Pain Edu? --      Excl. in [place]? --     Constitutional: Alert and oriented. Well appearing for age and in no acute distress. Eyes: Conjunctivae are normal.  Head: Atraumatic. Nose: No congestion/rhinnorhea.  No active epistaxis.  Small area of fresh blood on the medial side of the left nare anteriorly.  Oropharynx clear. Mouth/Throat: Mucous membranes are moist.   Neck: Normal range of motion.  Cardiovascular: Normal rate, regular rhythm.  Good peripheral circulation. Respiratory: Normal respiratory effort.  No retractions.  Gastrointestinal: No distention.  Musculoskeletal: Extremities warm and well perfused.  Neurologic:  Normal speech and language. No gross focal neurologic deficits are appreciated.  Skin:  Skin is warm and dry. No rash noted. Psychiatric: Mood and affect are normal. Speech and behavior are normal.  ____________________________________________   LABS (all labs ordered are listed, but only abnormal results are displayed)  Labs Reviewed - No data to  display ____________________________________________  EKG   ____________________________________________  RADIOLOGY    ____________________________________________   PROCEDURES  Procedure(s) performed: No  Procedures  Critical Care performed: No ____________________________________________   INITIAL IMPRESSION / ASSESSMENT AND PLAN / ED COURSE  Pertinent labs & imaging results that were available during my care of the patient were reviewed by me and considered in my medical decision making (see chart for details).  86 year old female with PMH as noted above including atrial fibrillation on Xarelto and frequent epistaxis presents with an episode of epistaxis from the left nostril this morning.  She states that it was somewhat more brisk bleeding than she has previously had, although it stopped around the time EMS arrived.  They also noted that the patient was hypertensive although she had just taken her antihypertensive medications.  On exam currently, the patient is very well-appearing for her age.  Her blood pressure is significantly improved and is almost normal.  She has a small area of fresh blood anteriorly along the medial side of the left nare with no active bleeding.  Exam is otherwise unremarkable.  Presentation is consistent with anterior epistaxis likely exacerbated by anticoagulation.  We will administer some  Afrin nasal spray and observe the patient for 1 to 2 hours.  There is no indication for lab work-up.  There is no evidence of hypertensive crisis or other acute issue besides epistaxis.  ----------------------------------------- 1:27 PM on 11/24/2019 -----------------------------------------  The patient has had no recurrent epistaxis.  Vital signs remain stable.  She is appropriate for discharge at this time.  Return precautions given, and she expresses understanding.  ____________________________________________   FINAL CLINICAL IMPRESSION(S) / ED  DIAGNOSES  Final diagnoses:  Epistaxis      NEW MEDICATIONS STARTED DURING THIS VISIT:  New Prescriptions   OXYMETAZOLINE (AFRIN) 0.05 % NASAL SPRAY    Place 1 spray into left nostril 2 (two) times daily for 3 days.     Note:  This document was prepared using Dragon voice recognition software and may include unintentional dictation errors.    Arta Silence, MD 11/24/19 1327

## 2019-11-24 NOTE — ED Notes (Signed)
Patient assisted to bedside commode.

## 2019-11-24 NOTE — Discharge Instructions (Addendum)
Use the Afrin as prescribed over the next 3 days.  Return to the ER for new, worsening, or persistent severe nosebleed, high blood pressure, headache, vomiting, weakness, chest pain, shortness of breath, other abnormal bleeding, or any other new or worsening symptoms that concern you.

## 2019-11-24 NOTE — ED Notes (Signed)
Discharge instructions reviewed with patient and son. E-sign pad, not working.

## 2019-11-24 NOTE — ED Triage Notes (Addendum)
Pt in from home via [facility] w/epistaxis that began at 0400 this am. Pt does take Xarelto daily, last dose 6pm last night. HTN en route, 170/80's. Pt states intermittent bleeding until EMS arrival. No bleeding present on ED arrival. Denies any dizziness, but is having generalized weakness. Did take all BP meds PTA. Reports using 1 large roll of TP to control bleed PTA

## 2019-12-15 ENCOUNTER — Emergency Department
Admission: EM | Admit: 2019-12-15 | Discharge: 2019-12-16 | Disposition: A | Discharge status: Home / Self Care | Payer: PPO | Attending: Emergency Medicine | Admitting: Emergency Medicine

## 2019-12-15 DIAGNOSIS — I4891 Unspecified atrial fibrillation: Secondary | ICD-10-CM | POA: Diagnosis not present

## 2019-12-15 DIAGNOSIS — R0902 Hypoxemia: Secondary | ICD-10-CM | POA: Diagnosis not present

## 2019-12-15 DIAGNOSIS — R58 Hemorrhage, not elsewhere classified: Secondary | ICD-10-CM | POA: Diagnosis not present

## 2019-12-15 DIAGNOSIS — I509 Heart failure, unspecified: Secondary | ICD-10-CM | POA: Insufficient documentation

## 2019-12-15 DIAGNOSIS — I10 Essential (primary) hypertension: Secondary | ICD-10-CM | POA: Diagnosis not present

## 2019-12-15 DIAGNOSIS — R040 Epistaxis: Secondary | ICD-10-CM

## 2019-12-15 DIAGNOSIS — I110 Hypertensive heart disease with heart failure: Secondary | ICD-10-CM | POA: Insufficient documentation

## 2019-12-15 DIAGNOSIS — Z79899 Other long term (current) drug therapy: Secondary | ICD-10-CM | POA: Insufficient documentation

## 2019-12-15 DIAGNOSIS — Z7901 Long term (current) use of anticoagulants: Secondary | ICD-10-CM | POA: Diagnosis not present

## 2019-12-15 DIAGNOSIS — J449 Chronic obstructive pulmonary disease, unspecified: Secondary | ICD-10-CM | POA: Insufficient documentation

## 2019-12-15 MED ORDER — OXYMETAZOLINE HCL 0.05 % NA SOLN
1.0000 | NASAL | Status: DC | PRN
  Administered 2019-12-16: 1 via NASAL
  Filled 2019-12-15: qty 30

## 2019-12-15 NOTE — ED Provider Notes (Signed)
[facility] Emergency Department Provider Note ____________________________________________   First MD Initiated Contact with Patient 12/15/19 2351     (approximate)  I have reviewed the triage vital signs and the nursing notes.  HISTORY  Chief Complaint Epistaxis (for 1 hour , right nare, ems gave afrin , no bleeding at this time, pt on xarelto for a fib )   HPI Cynthia Terry is a 86 y.o. femalewho presents to the ED for evaluation of epistaxis.   Chart review indicates hx Afib on Xarelto, HTN, CHF, depression.  Patient reports she lives at home independently, with a caregiver who helps her 12 hours/day every day. Patient seen in our ED 3 weeks ago for the same.  Patient reports intermittent epistaxis over the past few weeks that she has used Afrin to control.  She reports a more profuse epistaxis affecting her right nare this evening, that initiated about 2 hours prior to arrival.  She did not try Afrin at home as she recently ran out of this medication.  EMS reports providing Afrin and has since achieved hemostasis.  Patient reports anxiety that her epistaxis will return and reports feeling guilty calling her children for help.  She reports compliance with the medications and no recent illnesses.  No recent falls or trauma.  Denies picking her nose.  Reports dry skin to the bridge of her nose from wearing masks frequently.   Past Medical History:  Diagnosis Date  . Atrial fibrillation ([place])   . Hypertension     Patient Active Problem List   Diagnosis Date Noted  . Moderate tricuspid insufficiency 07/18/2018  . SOBOE (shortness of breath on exertion) 04/11/2018  . Persistent atrial fibrillation ([place]) 04/12/2017  . Anxiety 02/27/2017  . Gastroesophageal reflux disease without esophagitis 02/27/2017  . COPD (chronic obstructive pulmonary disease) ([place]) 10/18/2016  . Acute idiopathic gout 09/18/2016  . Chronic pulmonary hypertension ([place]) 09/13/2015  .  Vitamin D insufficiency 08/03/2011  . Arthritis 09/21/2010  . Polymyalgia rheumatica ([place]) 09/21/2010    History reviewed. No pertinent surgical history.  Prior to Admission medications   Medication Sig Start Date End Date Taking? Authorizing Provider  albuterol (VENTOLIN HFA) 108 (90 Base) MCG/ACT inhaler Inhale into the lungs every 6 (six) hours as needed for wheezing or shortness of breath.    [provider]  allopurinol (ZYLOPRIM) 100 MG tablet Take 100 mg by mouth.  02/24/15   [provider]  ammonium lactate (AMLACTIN) 12 % cream Apply 1 application topically 2 (two) times daily. 06/18/19   [provider]  Cholecalciferol (VITAMIN D-3) 25 MCG (1000 UT) CAPS Take 2,000 Units by mouth.     [provider]  citalopram (CELEXA) 10 MG tablet Take 10 mg by mouth daily.  10/28/14   [provider]  furosemide (LASIX) 40 MG tablet Take 40 mg by mouth daily.  09/09/14 11/24/19  [provider]  metoprolol tartrate (LOPRESSOR) 50 MG tablet Take 50 mg by mouth 2 (two) times daily.  01/27/15   [provider]  montelukast (SINGULAIR) 10 MG tablet Take 10 mg by mouth at bedtime.  02/05/15   [provider]  omeprazole (PRILOSEC) 20 MG capsule Take 20 mg by mouth daily.  11/09/14   [provider]  Rivaroxaban (XARELTO) 15 MG TABS tablet Take 15 mg by mouth daily with supper.  12/16/14   [provider]  spironolactone (ALDACTONE) 25 MG tablet Take 25 mg by mouth once.  08/05/14 11/24/19  [provider]  vitamin B-12 (CYANOCOBALAMIN) 1000 MCG tablet Take 1,000 mcg by mouth daily.    [provider]    Allergies Amlodipine and Clonidine derivatives  Family History  Problem Relation Age of Onset  . Heart disease Mother   . Renal cancer Father   . Heart disease Brother     Social History Social History   Tobacco Use  . Smoking status: Never Smoker  . Smokeless tobacco: Never Used   Substance Use Topics  . Alcohol use: No    Alcohol/week: 0.0 standard drinks  . Drug use: No    Review of Systems  Constitutional: No fever/chills Eyes: No visual changes. ENT: No sore throat.   Positivefor epistaxis  Cardiovascular: Denies chest pain. Respiratory: Denies shortness of breath. Gastrointestinal: No abdominal pain.  No nausea, no vomiting.  No diarrhea.  No constipation. Genitourinary: Negative for dysuria. Musculoskeletal: Negative for back pain. Skin: Negative for rash. Neurological: Negative for headaches, focal weakness or numbness.  ____________________________________________   PHYSICAL EXAM:  VITAL SIGNS: Vitals:   12/15/19 2345 12/16/19 0138  BP: (!) 165/73 (!) 169/71  Pulse: 66 88  Resp: 17 17  Temp: 98 F (36.7 C) 98.1 F (36.7 C)  SpO2: 98% 98%     Constitutional: Alert and oriented. Well appearing and in no acute distress.  Pleasant and conversational in full sentences. Eyes: Conjunctivae are normal. PERRL. EOMI. Head: Atraumatic. Nose: No congestion/rhinnorhea. Dried blood around the right nare is present. Hemostatic.  No nasal septal hematoma. Small 2 mm raw and vascularized area to the septum of the right nare, hemostatic. Dry skin to the bridge of the nose externally.  No signs of trauma. Posterior oropharynx without any bleeding, swelling or erythema.  Uvula is midline. Mouth/Throat: Mucous membranes are moist.  Oropharynx non-erythematous. Neck: No stridor. No cervical spine tenderness to palpation. Cardiovascular: Normal rate, regular rhythm. Grossly normal heart sounds.  Good peripheral circulation. Respiratory: Normal respiratory effort.  No retractions. Lungs CTAB. Gastrointestinal: Soft , nondistended, nontender to palpation. No CVA tenderness. Musculoskeletal: No lower extremity tenderness nor edema.  No joint effusions. No signs of acute trauma. Neurologic:  Normal speech and language. No gross focal neurologic deficits are  appreciated. No gait instability noted. Skin:  Skin is warm, dry and intact. No rash noted. Psychiatric: Mood and affect are normal. Speech and behavior are normal.  ____________________________________________   PROCEDURES and INTERVENTIONS  Procedure(s) performed (including Critical Care):  .1-3 Lead EKG Interpretation Performed by: Vladimir Crofts, MD Authorized by: Vladimir Crofts, MD     Interpretation: normal     ECG rate:  84   ECG rate assessment: normal     Rhythm: sinus rhythm     Ectopy: none     Conduction: normal   Cauterization  Date/Time: 12/16/2019 1:57 AM Performed by: Vladimir Crofts, MD Authorized by: Vladimir Crofts, MD  Patient tolerance: patient tolerated the procedure well with no immediate complications Comments: Silver nitrate was used to cauterize culprit vessel to the medial portion of the right nare.  Direct visualization with nasal speculum.  Tolerated well without immediate complication apparent     Medications  oxymetazoline (AFRIN) 0.05 % nasal spray 1 spray (1 spray Each Nare Given 12/16/19 0017)  silver nitrate applicators applicator 1 Stick (1 Stick Topical Given 12/16/19 0017)    ____________________________________________   MDM / ED COURSE  86 year old woman on Xarelto for A. fib presents to the ED with epistaxis, resolved with EMS Afrin, amenable to bedside silver  nitrate cauterization of culprit vessel and subsequent outpatient management.  Mild hypertension, otherwise normal vitals on room air.  Exam demonstrates hemostatic vessel to the right nare as likely culprit lesion.  She has dried blood throughout her nares but no active bleeding.  No evidence of posterior bleed or oropharyngeal pathology.  No signs of trauma or nasal septal hematoma.  While her bleeding has stopped with the time she arrived to the ED, she does have an area amenable to silver nitrate cauterization, performed by me at the bedside.  She was then observed for about 1.5 hours  without rebleeding.  Provided her with Afrin and nose clamp, we discussed outpatient management of epistaxis and we discussed return precautions for the ED.  We discussed following up with ENT, and I provided her with contact information to facilitate this.  Patient medically stable for discharge home.   Clinical Course as of Dec 16 154  Tue Dec 16, 2019  0001 Returned to the bedside with a nasal speculum and was able to visualize a raw area to the medial nasal septum that I believe would be amenable to silver nitrate cauterization.   [DS]  0012 Silver nitrate and Afrin applied   [DS]  0034 Reassessed.  No bleeding.  Son at the bedside.   [DS]    Clinical Course User Index [DS] Vladimir Crofts, MD    ____________________________________________   FINAL CLINICAL IMPRESSION(S) / ED DIAGNOSES  Final diagnoses:  Epistaxis  Right-sided epistaxis  Anticoagulated     ED Discharge Orders    None       Sherise Geerdes Tamala Julian   Note:  This document was prepared using Dragon voice recognition software and may include unintentional dictation errors.   Vladimir Crofts, MD 12/16/19 [phone]

## 2019-12-15 NOTE — ED Triage Notes (Signed)
Pt had a right nare nose bleed, no bleeding at this time, pt on xarelto for a fib, pt has htn history

## 2019-12-16 DIAGNOSIS — R0982 Postnasal drip: Secondary | ICD-10-CM | POA: Diagnosis not present

## 2019-12-16 MED ORDER — SILVER NITRATE-POT NITRATE 75-25 % EX MISC
1.0000 | Freq: Once | CUTANEOUS | Status: AC
  Administered 2019-12-16: 1 via TOPICAL
  Filled 2019-12-16: qty 10

## 2019-12-16 NOTE — Discharge Instructions (Signed)
We cauterized a bleeding area within your right nostril.  Please continue to use the Afrin as needed at home for any nosebleeds.  Like we discussed, do not use this every day if you are not bleeding.  If you do have a nosebleed, please use 2 puffs of Afrin to the affected side, and then placed the nose clamp over your nose.  Keep your head forward and spit out any blood instead of swallowing it.  If the bleeding does not stop with these measures, please return to the ED.  I would recommend that you follow-up with an ENT physician, and I have attached the contact information for one of our local doctors.

## 2020-01-06 ENCOUNTER — Other Ambulatory Visit: Payer: PPO | Admitting: Primary Care

## 2020-01-13 DIAGNOSIS — I495 Sick sinus syndrome: Secondary | ICD-10-CM | POA: Diagnosis not present

## 2020-01-14 ENCOUNTER — Other Ambulatory Visit: Payer: Self-pay

## 2020-01-14 ENCOUNTER — Other Ambulatory Visit: Payer: PPO | Admitting: Primary Care

## 2020-01-14 DIAGNOSIS — Z515 Encounter for palliative care: Secondary | ICD-10-CM

## 2020-01-14 DIAGNOSIS — I4819 Other persistent atrial fibrillation: Secondary | ICD-10-CM | POA: Diagnosis not present

## 2020-01-14 DIAGNOSIS — J449 Chronic obstructive pulmonary disease, unspecified: Secondary | ICD-10-CM | POA: Diagnosis not present

## 2020-01-14 DIAGNOSIS — F419 Anxiety disorder, unspecified: Secondary | ICD-10-CM | POA: Diagnosis not present

## 2020-01-14 NOTE — Progress Notes (Signed)
[facility] Pall[facility] Consult Note Telephone: [phone]  Fax: [phone]     Date of encounter: 01/14/20 PATIENT NAME: Cynthia Terry [address] [place] [address] [phone] (home)  DOB: Dec 17, 1924 MRN: [id number]  PRIMARY CARE PROVIDER:    Valera Castle, MD,  [place] [address] [phone]  REFERRING PROVIDER:   Valera Castle, [place],  [place] [address] [phone]  RESPONSIBLE PARTY:   Extended Emergency Contact Information Primary Emergency Contact: Cynthia Terry Address: [address] [place], [place] [address] Home Phone: [phone] Relation: Daughter Secondary Emergency Contact: Cynthia, Terry Phone: [phone] Mobile Phone: [phone] Relation: Son Preferred language: English  Terry met face to face with patient and family in  Home.  Palliative Care was asked to follow this patient by consultation request of Cynthia Terry, * to help address advance care planning and goals of care. This is a follow up  visit.   ASSESSMENT AND RECOMMENDATIONS:   1. Advance Care Planning/Goals of Care: Goals include to maximize quality of life and symptom management. Our advance care planning conversation included a discussion about:      Exploration of personal, cultural or spiritual beliefs that might influence medical decisions     Identification and preparation of a healthcare agent - daughter and son   Review of an  advance directive document - no changes.   2. Symptom Management:   Epistaxis: Has had several bouts. Saw ENT and has benefited from [place] on site in home. She has some for emergency use.  On xarelto which increases risk but deemed benefits outweigh the risks. Using humidity in home, Vaseline in nares.  Safety: Has lift chair and ambulates with walker. Has med alert. Feels safe to be alone at night, caregiver with her during  the day.   Immobility: States she is getting less mobile and son concurs. He states it is hard to get her out of the house and to appts. She is getting a ramp for xmas which will help with getting to the car. Recommend continued use of lift chair, walker and stand by assist. She is reticent to push herself beyond walking to the bathroom and kitchen, a 50 ' trip. No falls reported.   Prevention: discussed immunizations for shingles, and flu. She has refused covid vaccine in the past. Family wanted home flu vaccine service. Referred to [facility] in [place] for more info. They do covid vaccines in the home but the family will need to call to see if they will administer flu vaccine.   Medications reviewed with patient and family.  3. Follow up Palliative Care Visit: Palliative care will continue to follow for goals of care clarification and symptom management. Return 4-6 months or prn.  4. Family /Caregiver/Community Supports: Has 12 hour care giver daily. Has medical alert for hs.   5. Cognitive / Functional decline: A and O x 3, needs help with iadls, many adls due to decreased mobility.  Terry spent 60 minutes providing this consultation,  from 1430 to 1530. More than 50% of the time in this consultation was spent coordinating communication.   CODE STATUS:DNR  PPS: 50%  HOSPICE ELIGIBILITY/DIAGNOSIS: no  Subjective:  CHIEF COMPLAINT: immobility  HISTORY OF PRESENT ILLNESS:  Cynthia Terry is a 86 y.o. year old female  with  Arthritis and decreasing mobility, recent epistaxis.  We are asked to consult around advance care planning and complex medical decision making.    Review and summarization of old Epic records shows or history from other than patient  shows recent ED trips. Review of case with family member son Cynthia Terry and caregiver.Marland Kitchen  History obtained from review of EMR, discussion with primary team, and  interview with family, caregiver  and/or Cynthia Terry. Records reviewed  and summarized above.   CURRENT PROBLEM LIST:  Patient Active Problem List   Diagnosis Date Noted   Moderate tricuspid insufficiency 07/18/2018   SOBOE (shortness of breath on exertion) 04/11/2018   Persistent atrial fibrillation ([place]) 04/12/2017   Anxiety 02/27/2017   Gastroesophageal reflux disease without esophagitis 02/27/2017   COPD (chronic obstructive pulmonary disease) ([place]) 10/18/2016   Acute idiopathic gout 09/18/2016   Chronic pulmonary hypertension ([place]) 09/13/2015   Vitamin D insufficiency 08/03/2011   Arthritis 09/21/2010   Polymyalgia rheumatica ([place]) 09/21/2010   PAST MEDICAL HISTORY:  Active Ambulatory Problems    Diagnosis Date Noted   Acute idiopathic gout 09/18/2016   Anxiety 02/27/2017   Arthritis 09/21/2010   Chronic pulmonary hypertension ([place]) 09/13/2015   COPD (chronic obstructive pulmonary disease) ([place]) 10/18/2016   Gastroesophageal reflux disease without esophagitis 02/27/2017   Moderate tricuspid insufficiency 07/18/2018   Persistent atrial fibrillation ([place]) 04/12/2017   Polymyalgia rheumatica ([place]) 09/21/2010   SOBOE (shortness of breath on exertion) 04/11/2018   Vitamin D insufficiency 08/03/2011   Resolved Ambulatory Problems    Diagnosis Date Noted   No Resolved Ambulatory Problems   Past Medical History:  Diagnosis Date   Atrial fibrillation ([facility])    Hypertension    SOCIAL HX:  Social History   Tobacco Use   Smoking status: Never Smoker   Smokeless tobacco: Never Used  Substance Use Topics   Alcohol use: No    Alcohol/week: 0.0 standard drinks   FAMILY HX:  Family History  Problem Relation Age of Onset   Heart disease Mother    Renal cancer Father    Heart disease Brother       ALLERGIES:  Allergies  Allergen Reactions   Amlodipine     swelling   Clonidine Derivatives     dizziness     PERTINENT MEDICATIONS:  Outpatient Encounter Medications as of 01/14/2020  Medication Sig    albuterol (VENTOLIN HFA) 108 (90 Base) MCG/ACT inhaler Inhale into the lungs every 6 (six) hours as needed for wheezing or shortness of breath.   allopurinol (ZYLOPRIM) 100 MG tablet Take 100 mg by mouth.    ammonium lactate (AMLACTIN) 12 % cream Apply 1 application topically 2 (two) times daily.   Cholecalciferol (VITAMIN D-3) 25 MCG (1000 UT) CAPS Take 2,000 Units by mouth.    citalopram (CELEXA) 10 MG tablet Take 10 mg by mouth daily.    furosemide (LASIX) 40 MG tablet Take 40 mg by mouth daily.    metoprolol tartrate (LOPRESSOR) 50 MG tablet Take 50 mg by mouth 2 (two) times daily.    montelukast (SINGULAIR) 10 MG tablet Take 10 mg by mouth at bedtime.    omeprazole (PRILOSEC) 20 MG capsule Take 20 mg by mouth daily.    Rivaroxaban (XARELTO) 15 MG TABS tablet Take 15 mg by mouth daily with supper.    spironolactone (ALDACTONE) 25 MG tablet Take 25 mg by mouth once.    vitamin B-12 (CYANOCOBALAMIN) 1000 MCG tablet Take 1,000 mcg by mouth daily.   No facility-administered encounter medications on file as of 01/14/2020.  Objective: ROS  General: NAD EYES: denies vision changes ENMT: denies dysphagia Cardiovascular: denies chest pain Pulmonary: denies  cough, denies increased SOB Abdomen: endorses good  appetite, endorses constipation, endorses continence of bowel GU: denies dysuria, endorses occ incontinence of urine  MSK:  endorses ROM limitations, no falls reported Skin: denies rashes or wounds Neurological: endorses weakness, denies pain, denies insomnia Psych: Endorses positive mood Heme/lymph/immuno: denies bruises, abnormal bleeding  Physical Exam: Current and past weights:stable  Constitutional:  NAD General: frail appearing, obese  EYES: anicteric sclera,lids intact, no discharge  ENMT: intact hearing,oral mucous membranes moist, dentition intact CV:  no LE edema Pulmonary: no increased work of breathing, no cough, no audible wheezes, room air Abdomen:  intake 100%,  no ascites MSK: mild sarcopenia, decreased ROM in all extremities, no contractures of LE, ambulatory  With walker Skin: warm and dry, no rashes or wounds on visible skin Neuro: Generalized weakness, no cognitive impairment, grossly non -focal Psych: non-anxious affect, A and O x 3 Hem/lymph/immuno: no widespread bruising   Thank you for the opportunity to participate in the care of Ms. Baldus.  The palliative care team will continue to follow. Please call our office at [phone] if we can be of additional assistance.  Jason Coop, NP , DNP, MPH, AGPCNP-BC, ACHPN  COVID-19 PATIENT SCREENING TOOL  Person answering questions: ____________SELF______ _____   1.  Is the patient or any family member in the home showing any signs or symptoms regarding respiratory infection?               Person with Symptom- __________NA_________________  a. Fever                                                                          Yes___ No___          ___________________  b. Shortness of breath                                                    Yes___ No___          ___________________ c. Cough/congestion                                       Yes___  No___         ___________________ d. Body aches/pains                                                         Yes___ No___        ____________________ e. Gastrointestinal symptoms (diarrhea, nausea)           Yes___ No___        ____________________  2. Within the past 14 days, has anyone living in the home had any contact with someone with or under investigation for COVID-19?  Yes___ No_X_   Person __________________

## 2020-01-29 ENCOUNTER — Telehealth: Payer: Self-pay | Admitting: Primary Care

## 2020-01-29 NOTE — Telephone Encounter (Signed)
Returned call to son who called to report respiratory sx with patient. He notes she is afebrile and congested. No answer, message left to call back to our RN or to PCP MD for advice. Also left number to reach me if needed.

## 2020-01-31 ENCOUNTER — Emergency Department: Payer: PPO

## 2020-01-31 ENCOUNTER — Other Ambulatory Visit: Payer: Self-pay

## 2020-01-31 ENCOUNTER — Inpatient Hospital Stay
Admission: EM | Admit: 2020-01-31 | Discharge: 2020-03-02 | DRG: 177 | Disposition: E | Payer: PPO | Attending: Internal Medicine | Admitting: Internal Medicine

## 2020-01-31 ENCOUNTER — Encounter: Payer: Self-pay | Admitting: Family Medicine

## 2020-01-31 DIAGNOSIS — J9601 Acute respiratory failure with hypoxia: Secondary | ICD-10-CM | POA: Diagnosis present

## 2020-01-31 DIAGNOSIS — K219 Gastro-esophageal reflux disease without esophagitis: Secondary | ICD-10-CM | POA: Diagnosis present

## 2020-01-31 DIAGNOSIS — J80 Acute respiratory distress syndrome: Secondary | ICD-10-CM | POA: Diagnosis present

## 2020-01-31 DIAGNOSIS — J1282 Pneumonia due to coronavirus disease 2019: Secondary | ICD-10-CM | POA: Diagnosis present

## 2020-01-31 DIAGNOSIS — D72818 Other decreased white blood cell count: Secondary | ICD-10-CM | POA: Diagnosis not present

## 2020-01-31 DIAGNOSIS — U071 COVID-19: Secondary | ICD-10-CM | POA: Diagnosis not present

## 2020-01-31 DIAGNOSIS — R404 Transient alteration of awareness: Secondary | ICD-10-CM | POA: Diagnosis not present

## 2020-01-31 DIAGNOSIS — I10 Essential (primary) hypertension: Secondary | ICD-10-CM | POA: Diagnosis not present

## 2020-01-31 DIAGNOSIS — Z66 Do not resuscitate: Secondary | ICD-10-CM | POA: Diagnosis present

## 2020-01-31 DIAGNOSIS — Z7189 Other specified counseling: Secondary | ICD-10-CM | POA: Diagnosis not present

## 2020-01-31 DIAGNOSIS — I272 Pulmonary hypertension, unspecified: Secondary | ICD-10-CM | POA: Diagnosis present

## 2020-01-31 DIAGNOSIS — E878 Other disorders of electrolyte and fluid balance, not elsewhere classified: Secondary | ICD-10-CM | POA: Diagnosis not present

## 2020-01-31 DIAGNOSIS — R0689 Other abnormalities of breathing: Secondary | ICD-10-CM | POA: Diagnosis not present

## 2020-01-31 DIAGNOSIS — I130 Hypertensive heart and chronic kidney disease with heart failure and stage 1 through stage 4 chronic kidney disease, or unspecified chronic kidney disease: Secondary | ICD-10-CM | POA: Diagnosis not present

## 2020-01-31 DIAGNOSIS — I214 Non-ST elevation (NSTEMI) myocardial infarction: Secondary | ICD-10-CM | POA: Diagnosis not present

## 2020-01-31 DIAGNOSIS — J440 Chronic obstructive pulmonary disease with acute lower respiratory infection: Secondary | ICD-10-CM | POA: Diagnosis present

## 2020-01-31 DIAGNOSIS — Z515 Encounter for palliative care: Secondary | ICD-10-CM

## 2020-01-31 DIAGNOSIS — R4182 Altered mental status, unspecified: Secondary | ICD-10-CM | POA: Diagnosis not present

## 2020-01-31 DIAGNOSIS — M109 Gout, unspecified: Secondary | ICD-10-CM | POA: Diagnosis present

## 2020-01-31 DIAGNOSIS — R062 Wheezing: Secondary | ICD-10-CM | POA: Diagnosis not present

## 2020-01-31 DIAGNOSIS — E538 Deficiency of other specified B group vitamins: Secondary | ICD-10-CM | POA: Diagnosis present

## 2020-01-31 DIAGNOSIS — R739 Hyperglycemia, unspecified: Secondary | ICD-10-CM | POA: Diagnosis not present

## 2020-01-31 DIAGNOSIS — D631 Anemia in chronic kidney disease: Secondary | ICD-10-CM | POA: Diagnosis not present

## 2020-01-31 DIAGNOSIS — I110 Hypertensive heart disease with heart failure: Secondary | ICD-10-CM | POA: Diagnosis not present

## 2020-01-31 DIAGNOSIS — Z7901 Long term (current) use of anticoagulants: Secondary | ICD-10-CM | POA: Diagnosis not present

## 2020-01-31 DIAGNOSIS — F32A Depression, unspecified: Secondary | ICD-10-CM | POA: Diagnosis not present

## 2020-01-31 DIAGNOSIS — E559 Vitamin D deficiency, unspecified: Secondary | ICD-10-CM | POA: Diagnosis present

## 2020-01-31 DIAGNOSIS — Z79899 Other long term (current) drug therapy: Secondary | ICD-10-CM | POA: Diagnosis not present

## 2020-01-31 DIAGNOSIS — I4891 Unspecified atrial fibrillation: Secondary | ICD-10-CM | POA: Diagnosis not present

## 2020-01-31 DIAGNOSIS — J449 Chronic obstructive pulmonary disease, unspecified: Secondary | ICD-10-CM | POA: Diagnosis present

## 2020-01-31 DIAGNOSIS — R0602 Shortness of breath: Secondary | ICD-10-CM | POA: Diagnosis not present

## 2020-01-31 DIAGNOSIS — I509 Heart failure, unspecified: Secondary | ICD-10-CM | POA: Diagnosis not present

## 2020-01-31 DIAGNOSIS — E872 Acidosis: Secondary | ICD-10-CM | POA: Diagnosis present

## 2020-01-31 DIAGNOSIS — N1832 Chronic kidney disease, stage 3b: Secondary | ICD-10-CM | POA: Diagnosis not present

## 2020-01-31 DIAGNOSIS — R069 Unspecified abnormalities of breathing: Secondary | ICD-10-CM | POA: Diagnosis not present

## 2020-01-31 DIAGNOSIS — R7989 Other specified abnormal findings of blood chemistry: Secondary | ICD-10-CM | POA: Diagnosis not present

## 2020-01-31 DIAGNOSIS — I517 Cardiomegaly: Secondary | ICD-10-CM | POA: Diagnosis not present

## 2020-01-31 DIAGNOSIS — N179 Acute kidney failure, unspecified: Secondary | ICD-10-CM | POA: Diagnosis not present

## 2020-01-31 DIAGNOSIS — E871 Hypo-osmolality and hyponatremia: Secondary | ICD-10-CM | POA: Diagnosis not present

## 2020-01-31 DIAGNOSIS — I482 Chronic atrial fibrillation, unspecified: Secondary | ICD-10-CM | POA: Diagnosis not present

## 2020-01-31 LAB — COMPREHENSIVE METABOLIC PANEL
ALT: 17 U/L (ref 0–44)
AST: 37 U/L (ref 15–41)
Albumin: 3.3 g/dL — ABNORMAL LOW (ref 3.5–5.0)
Alkaline Phosphatase: 52 U/L (ref 38–126)
Anion gap: 13 (ref 5–15)
BUN: 42 mg/dL — ABNORMAL HIGH (ref 8–23)
CO2: 23 mmol/L (ref 22–32)
Calcium: 8.7 mg/dL — ABNORMAL LOW (ref 8.9–10.3)
Chloride: 89 mmol/L — ABNORMAL LOW (ref 98–111)
Creatinine, Ser: 1.69 mg/dL — ABNORMAL HIGH (ref 0.44–1.00)
GFR, Estimated: 28 mL/min — ABNORMAL LOW (ref >60)
Glucose, Bld: 132 mg/dL — ABNORMAL HIGH (ref 70–99)
Potassium: 3.9 mmol/L (ref 3.5–5.1)
Sodium: 125 mmol/L — ABNORMAL LOW (ref 135–145)
Total Bilirubin: 0.7 mg/dL (ref 0.3–1.2)
Total Protein: 6.6 g/dL (ref 6.5–8.1)

## 2020-01-31 LAB — BRAIN NATRIURETIC PEPTIDE: B Natriuretic Peptide: 167.9 pg/mL — ABNORMAL HIGH (ref 0.0–100.0)

## 2020-01-31 LAB — CBC WITH DIFFERENTIAL/PLATELET
Abs Immature Granulocytes: 0.03 10*3/uL (ref 0.00–0.07)
Basophils Absolute: 0 10*3/uL (ref 0.0–0.1)
Basophils Relative: 0 %
Eosinophils Absolute: 0 10*3/uL (ref 0.0–0.5)
Eosinophils Relative: 0 %
HCT: 33.9 % — ABNORMAL LOW (ref 36.0–46.0)
Hemoglobin: 11.7 g/dL — ABNORMAL LOW (ref 12.0–15.0)
Immature Granulocytes: 1 %
Lymphocytes Relative: 17 %
Lymphs Abs: 0.9 10*3/uL (ref 0.7–4.0)
MCH: 32.7 pg (ref 26.0–34.0)
MCHC: 34.5 g/dL (ref 30.0–36.0)
MCV: 94.7 fL (ref 80.0–100.0)
Monocytes Absolute: 0.4 10*3/uL (ref 0.1–1.0)
Monocytes Relative: 9 %
Neutro Abs: 3.6 10*3/uL (ref 1.7–7.7)
Neutrophils Relative %: 73 %
Platelets: 206 10*3/uL (ref 150–400)
RBC: 3.58 MIL/uL — ABNORMAL LOW (ref 3.87–5.11)
RDW: 13.3 % (ref 11.5–15.5)
WBC: 4.9 10*3/uL (ref 4.0–10.5)
nRBC: 0 % (ref 0.0–0.2)

## 2020-01-31 LAB — FIBRIN DERIVATIVES D-DIMER (ARMC ONLY): Fibrin derivatives D-dimer (ARMC): 1563.7 ng/mL (FEU) — ABNORMAL HIGH (ref 0.00–499.00)

## 2020-01-31 LAB — MAGNESIUM: Magnesium: 1.4 mg/dL — ABNORMAL LOW (ref 1.7–2.4)

## 2020-01-31 LAB — FERRITIN: Ferritin: 362 ng/mL — ABNORMAL HIGH (ref 11–307)

## 2020-01-31 LAB — PROCALCITONIN: Procalcitonin: 0.25 ng/mL

## 2020-01-31 LAB — POC SARS CORONAVIRUS 2 AG -  ED: SARS Coronavirus 2 Ag: POSITIVE — AB

## 2020-01-31 LAB — FIBRINOGEN: Fibrinogen: 657 mg/dL — ABNORMAL HIGH (ref 210–475)

## 2020-01-31 LAB — TROPONIN I (HIGH SENSITIVITY): Troponin I (High Sensitivity): 57 ng/L — ABNORMAL HIGH (ref <18)

## 2020-01-31 LAB — LACTIC ACID, PLASMA: Lactic Acid, Venous: 1.7 mmol/L (ref 0.5–1.9)

## 2020-01-31 LAB — LACTATE DEHYDROGENASE: LDH: 228 U/L — ABNORMAL HIGH (ref 98–192)

## 2020-01-31 MED ORDER — IPRATROPIUM-ALBUTEROL 0.5-2.5 (3) MG/3ML IN SOLN
3.0000 mL | Freq: Once | RESPIRATORY_TRACT | Status: AC
  Administered 2020-01-31: 3 mL via RESPIRATORY_TRACT
  Filled 2020-01-31: qty 3

## 2020-01-31 MED ORDER — ACETAMINOPHEN 325 MG PO TABS
650.0000 mg | ORAL_TABLET | Freq: Four times a day (QID) | ORAL | Status: DC | PRN
  Administered 2020-02-04: 22:00:00 650 mg via ORAL

## 2020-01-31 MED ORDER — CITALOPRAM HYDROBROMIDE 20 MG PO TABS
10.0000 mg | ORAL_TABLET | Freq: Every day | ORAL | Status: DC
  Administered 2020-02-01 – 2020-02-06 (×6): 10 mg via ORAL
  Filled 2020-01-31 (×6): qty 1

## 2020-01-31 MED ORDER — ONDANSETRON HCL 4 MG/2ML IJ SOLN: 4.0000 mg | Freq: Four times a day (QID) | INTRAMUSCULAR | Status: DC | PRN

## 2020-01-31 MED ORDER — METHYLPREDNISOLONE SODIUM SUCC 125 MG IJ SOLR
80.0000 mg | Freq: Once | INTRAMUSCULAR | Status: AC
  Administered 2020-01-31: 80 mg via INTRAVENOUS
  Filled 2020-01-31: qty 2

## 2020-01-31 MED ORDER — AMMONIUM LACTATE 12 % EX LOTN
TOPICAL_LOTION | Freq: Two times a day (BID) | CUTANEOUS | Status: DC
  Administered 2020-02-01: 1 via TOPICAL
  Filled 2020-01-31 (×2): qty 400

## 2020-01-31 MED ORDER — MAGNESIUM SULFATE 2 GM/50ML IV SOLN
2.0000 g | INTRAVENOUS | Status: AC
  Administered 2020-01-31: 2 g via INTRAVENOUS
  Filled 2020-01-31: qty 50

## 2020-01-31 MED ORDER — VITAMIN D3 25 MCG (1000 UNIT) PO TABS
2000.0000 [IU] | ORAL_TABLET | Freq: Every day | ORAL | Status: DC
  Administered 2020-02-01 – 2020-02-06 (×6): 2000 [IU] via ORAL
  Filled 2020-01-31 (×10): qty 2

## 2020-01-31 MED ORDER — ONDANSETRON HCL 4 MG PO TABS: 4.0000 mg | ORAL_TABLET | Freq: Four times a day (QID) | ORAL | Status: DC | PRN

## 2020-01-31 MED ORDER — PANTOPRAZOLE SODIUM 40 MG PO TBEC
40.0000 mg | DELAYED_RELEASE_TABLET | Freq: Every day | ORAL | Status: DC
  Administered 2020-02-01 – 2020-02-06 (×6): 40 mg via ORAL
  Filled 2020-01-31 (×6): qty 1

## 2020-01-31 MED ORDER — PREDNISONE 50 MG PO TABS
50.0000 mg | ORAL_TABLET | Freq: Every day | ORAL | Status: DC
  Administered 2020-02-04: 50 mg via ORAL
  Filled 2020-01-31: qty 1

## 2020-01-31 MED ORDER — ALBUTEROL SULFATE HFA 108 (90 BASE) MCG/ACT IN AERS
2.0000 | INHALATION_SPRAY | Freq: Four times a day (QID) | RESPIRATORY_TRACT | Status: DC | PRN
  Filled 2020-01-31: qty 6.7

## 2020-01-31 MED ORDER — SODIUM CHLORIDE 0.9 % IV SOLN
200.0000 mg | Freq: Once | INTRAVENOUS | Status: DC
  Filled 2020-01-31: qty 40

## 2020-01-31 MED ORDER — HYDROCOD POLST-CPM POLST ER 10-8 MG/5ML PO SUER: 5.0000 mL | Freq: Two times a day (BID) | ORAL | Status: DC | PRN

## 2020-01-31 MED ORDER — FAMOTIDINE 20 MG PO TABS
20.0000 mg | ORAL_TABLET | Freq: Two times a day (BID) | ORAL | Status: DC
  Administered 2020-02-01 – 2020-02-06 (×12): 20 mg via ORAL
  Filled 2020-01-31 (×11): qty 1

## 2020-01-31 MED ORDER — VITAMIN B-12 1000 MCG PO TABS
1000.0000 ug | ORAL_TABLET | Freq: Every day | ORAL | Status: DC
  Administered 2020-02-01 – 2020-02-06 (×6): 1000 ug via ORAL
  Filled 2020-01-31 (×6): qty 1

## 2020-01-31 MED ORDER — MAGNESIUM HYDROXIDE 400 MG/5ML PO SUSP
30.0000 mL | Freq: Every day | ORAL | Status: DC | PRN
  Filled 2020-01-31: qty 30

## 2020-01-31 MED ORDER — BARICITINIB 2 MG PO TABS
4.0000 mg | ORAL_TABLET | Freq: Every day | ORAL | Status: DC
  Administered 2020-02-01: 4 mg via ORAL
  Filled 2020-01-31 (×2): qty 2

## 2020-01-31 MED ORDER — ENOXAPARIN SODIUM 40 MG/0.4ML ~~LOC~~ SOLN: 40.0000 mg | SUBCUTANEOUS | Status: DC

## 2020-01-31 MED ORDER — RIVAROXABAN 15 MG PO TABS: 15.0000 mg | ORAL_TABLET | Freq: Every day | ORAL | Status: DC

## 2020-01-31 MED ORDER — METOPROLOL TARTRATE 50 MG PO TABS
50.0000 mg | ORAL_TABLET | Freq: Two times a day (BID) | ORAL | Status: DC
  Administered 2020-02-01 (×2): 50 mg via ORAL
  Filled 2020-01-31 (×2): qty 1

## 2020-01-31 MED ORDER — SODIUM CHLORIDE 0.9 % IV BOLUS
500.0000 mL | Freq: Once | INTRAVENOUS | Status: AC
  Administered 2020-01-31: 500 mL via INTRAVENOUS

## 2020-01-31 MED ORDER — SODIUM CHLORIDE 0.9 % IV SOLN: INTRAVENOUS | Status: DC

## 2020-01-31 MED ORDER — GUAIFENESIN ER 600 MG PO TB12
600.0000 mg | ORAL_TABLET | Freq: Two times a day (BID) | ORAL | Status: DC
  Administered 2020-02-01 – 2020-02-05 (×4): 600 mg via ORAL
  Filled 2020-01-31 (×7): qty 1

## 2020-01-31 MED ORDER — GUAIFENESIN-DM 100-10 MG/5ML PO SYRP
10.0000 mL | ORAL_SOLUTION | ORAL | Status: DC | PRN
  Administered 2020-02-05: 10 mL via ORAL
  Filled 2020-01-31: qty 10

## 2020-01-31 MED ORDER — ASPIRIN EC 81 MG PO TBEC
81.0000 mg | DELAYED_RELEASE_TABLET | Freq: Every day | ORAL | Status: DC
  Administered 2020-02-01 – 2020-02-06 (×6): 81 mg via ORAL
  Filled 2020-01-31 (×6): qty 1

## 2020-01-31 MED ORDER — TRAZODONE HCL 50 MG PO TABS: 25.0000 mg | ORAL_TABLET | Freq: Every evening | ORAL | Status: DC | PRN

## 2020-01-31 MED ORDER — ASCORBIC ACID 500 MG PO TABS
500.0000 mg | ORAL_TABLET | Freq: Every day | ORAL | Status: DC
  Administered 2020-02-01 – 2020-02-06 (×6): 500 mg via ORAL
  Filled 2020-01-31 (×6): qty 1

## 2020-01-31 MED ORDER — SODIUM CHLORIDE 0.9 % IV SOLN
100.0000 mg | Freq: Every day | INTRAVENOUS | Status: DC
  Filled 2020-01-31: qty 20

## 2020-01-31 MED ORDER — METHYLPREDNISOLONE SODIUM SUCC 125 MG IJ SOLR
1.0000 mg/kg | Freq: Two times a day (BID) | INTRAMUSCULAR | Status: AC
  Administered 2020-02-01 – 2020-02-03 (×6): 75 mg via INTRAVENOUS
  Filled 2020-01-31 (×6): qty 2

## 2020-01-31 MED ORDER — MONTELUKAST SODIUM 10 MG PO TABS
10.0000 mg | ORAL_TABLET | Freq: Every day | ORAL | Status: DC
  Administered 2020-02-01 – 2020-02-05 (×5): 10 mg via ORAL
  Filled 2020-01-31 (×7): qty 1

## 2020-01-31 MED ORDER — ALLOPURINOL 100 MG PO TABS
100.0000 mg | ORAL_TABLET | Freq: Every day | ORAL | Status: DC
  Administered 2020-02-01 – 2020-02-06 (×6): 100 mg via ORAL
  Filled 2020-01-31 (×6): qty 1

## 2020-01-31 MED ORDER — ZINC SULFATE 220 (50 ZN) MG PO CAPS
220.0000 mg | ORAL_CAPSULE | Freq: Every day | ORAL | Status: DC
  Administered 2020-02-01 – 2020-02-06 (×6): 220 mg via ORAL
  Filled 2020-01-31 (×6): qty 1

## 2020-01-31 MED ORDER — DM-GUAIFENESIN ER 30-600 MG PO TB12
1.0000 | ORAL_TABLET | Freq: Two times a day (BID) | ORAL | Status: DC
  Administered 2020-02-01 – 2020-02-06 (×11): 1 via ORAL
  Filled 2020-01-31 (×11): qty 1

## 2020-01-31 NOTE — ED Notes (Signed)
Dr. Arville Care, MD at bedside for evaluation at this time.

## 2020-01-31 NOTE — ED Notes (Signed)
Pt placed on heated high flow  by RT at this time.

## 2020-01-31 NOTE — H&P (Signed)
[place]   PATIENT NAME: Cynthia Terry    MR#:  [id number]  DATE OF BIRTH:  1924/10/07  DATE OF ADMISSION:  02/22/2020  PRIMARY CARE PHYSICIAN: Valera Castle, MD   REQUESTING/REFERRING PHYSICIAN: Brenton Grills, MD  CHIEF COMPLAINT:   Chief Complaint  Patient presents with  . Shortness of Breath    HISTORY OF PRESENT ILLNESS:  Cynthia Terry  is a 87 y.o. Caucasian female with a known history of chronic atrial fibrillation and hypertension, presented to the emergency room with acute onset of worsening dyspnea with associated dry cough and occasional wheezing over the last couple weeks.  She admitted to fatigue and tiredness and has not had much appetite.  She has been feeling sleepy.  She denied any fever or chills.  No nausea or vomiting or diarrhea.  No dysuria, oliguria or hematuria or flank pain.  She denied any chest pain or palpitations.  No headache or paresthesias or focal muscle weakness.  Upon presentation to the ER, respiratory rate was 32 and pulse was 77 and pulse oximetry was 88% on room air and 92% on 6 L of O2 by nasal cannula and given tachypnea respiratory distress she was placed on heated high flow nasal cannula at 50 L/min and pulse ox 90 was 92 to 94%.  Temperature was 97.5 with a blood pressure 123/59.  Labs revealed hyponatremia 125 and hypochloremia of 89 and a BUN of 42 with creatinine 1.69 magnesium was 1.4 with albumin of 3.3 lactic acid 1.7 with procalcitonin of 0.25.  CBC showed mild anemia.  COVID-19 PCR came back positive. Chest x-ray showed cardiomegaly with mild diffuse interstitial prominence bilaterally that may reflect mild edema with differential diagnosis including atypical/viral infectious process.EKG showed atrial fibrillation with controlled rate response of 88 with intraventricular conduction delay and LVH with T wave inversion laterally.  The patient was given IV remdesivir, duo nebs, 2 g of IV magnesium sulfate, 125 mg of IV  Solu-Medrol and 500 mm IV normal saline bolus.  She will be admitted to the medical monitored bed for further evaluation and management. PAST MEDICAL HISTORY:   Past Medical History:  Diagnosis Date  . Atrial fibrillation ([place])   . Hypertension   -Gout -Depression -GERD -Vitamin B12 deficiency -Vitamin D deficiency  PAST SURGICAL HISTORY:  No past surgical history on file.  She denies any previous surgeries.  SOCIAL HISTORY:   Social History   Tobacco Use  . Smoking status: Never Smoker  . Smokeless tobacco: Never Used  Substance Use Topics  . Alcohol use: No    Alcohol/week: 0.0 standard drinks    FAMILY HISTORY:   Family History  Problem Relation Age of Onset  . Heart disease Mother   . Renal cancer Father   . Heart disease Brother     DRUG ALLERGIES:   Allergies  Allergen Reactions  . Amlodipine     swelling  . Clonidine Derivatives     dizziness    REVIEW OF SYSTEMS:   ROS As per history of present illness. All pertinent systems were reviewed above. Constitutional, HEENT, cardiovascular, respiratory, GI, GU, musculoskeletal, neuro, psychiatric, endocrine, integumentary and hematologic systems were reviewed and are otherwise negative/unremarkable except for positive findings mentioned above in the HPI.   MEDICATIONS AT HOME:   Prior to Admission medications   Medication Sig Start Date End Date Taking? Authorizing Provider  albuterol (VENTOLIN HFA) 108 (90 Base) MCG/ACT inhaler Inhale into the lungs every 6 (six) hours  as needed for wheezing or shortness of breath.    [provider]  allopurinol (ZYLOPRIM) 100 MG tablet Take 100 mg by mouth.  02/24/15   [provider]  ammonium lactate (AMLACTIN) 12 % cream Apply 1 application topically 2 (two) times daily. 06/18/19   [provider]  Cholecalciferol (VITAMIN D-3) 25 MCG (1000 UT) CAPS Take 2,000 Units by mouth.     [provider]  citalopram (CELEXA) 10 MG tablet  Take 10 mg by mouth daily.  10/28/14   [provider]  furosemide (LASIX) 40 MG tablet Take 40 mg by mouth daily.  09/09/14 11/24/19  [provider]  metoprolol tartrate (LOPRESSOR) 50 MG tablet Take 50 mg by mouth 2 (two) times daily.  01/27/15   [provider]  montelukast (SINGULAIR) 10 MG tablet Take 10 mg by mouth at bedtime.  02/05/15   [provider]  omeprazole (PRILOSEC) 20 MG capsule Take 20 mg by mouth daily.  11/09/14   [provider]  Rivaroxaban (XARELTO) 15 MG TABS tablet Take 15 mg by mouth daily with supper.  12/16/14   [provider]  spironolactone (ALDACTONE) 25 MG tablet Take 25 mg by mouth once.  08/05/14 11/24/19  [provider]  vitamin B-12 (CYANOCOBALAMIN) 1000 MCG tablet Take 1,000 mcg by mouth daily.    [provider]      VITAL SIGNS:  Blood pressure (!) 111/56, pulse 85, temperature (!) 97.5 F (36.4 C), temperature source Oral, resp. rate (!) 26, height 5' (1.524 m), weight 74.8 kg, SpO2 92 %.  PHYSICAL EXAMINATION:  Physical Exam  GENERAL:  87 y.o.-year-old Caucasian female patient lying in the bed in no significant respiratory distress on heated high flow nasal cannula.  EYES: Pupils equal, round, reactive to light and accommodation. No scleral icterus. Extraocular muscles intact.  HEENT: Head atraumatic, normocephalic. Oropharynx and nasopharynx clear.  NECK:  Supple, no jugular venous distention. No thyroid enlargement, no tenderness.  LUNGS: Diminished bibasal breath sounds with bibasal crackles. CARDIOVASCULAR: Regular rate and rhythm, S1, S2 normal. No murmurs, rubs, or gallops.  ABDOMEN: Soft, nondistended, nontender. Bowel sounds present. No organomegaly or mass.  EXTREMITIES: No pedal edema, cyanosis, or clubbing.  NEUROLOGIC: Cranial nerves II through XII are intact. Muscle strength 5/5 in all extremities. Sensation intact. Gait not checked.  PSYCHIATRIC: The patient is alert  and oriented x 3.  Normal affect and good eye contact. SKIN: No obvious rash, lesion, or ulcer.   LABORATORY PANEL:   CBC Recent Labs  Lab 02/12/2020 1730  WBC 4.9  HGB 11.7*  HCT 33.9*  PLT 206   ------------------------------------------------------------------------------------------------------------------  Chemistries  Recent Labs  Lab 02/12/2020 1727  NA 125*  K 3.9  CL 89*  CO2 23  GLUCOSE 132*  BUN 42*  CREATININE 1.69*  CALCIUM 8.7*  MG 1.4*  AST 37  ALT 17  ALKPHOS 52  BILITOT 0.7   ------------------------------------------------------------------------------------------------------------------  Cardiac Enzymes No results for input(s): TROPONINI in the last 168 hours. ------------------------------------------------------------------------------------------------------------------  RADIOLOGY:  DG Chest Portable 1 View  Result Date: 02-12-20 CLINICAL DATA:  Shortness of breath EXAM: PORTABLE CHEST 1 VIEW COMPARISON:  12/15/2010 FINDINGS: Single lead left-sided implanted cardiac device. Stable cardiomegaly. Atherosclerotic calcification of the aortic knob. Mild diffuse interstitial prominence bilaterally. No focal airspace consolidation. No pleural effusion or pneumothorax. Severe degenerative changes of the bilateral shoulders. Multilevel thoracic spondylosis. IMPRESSION: Cardiomegaly with mild diffuse interstitial prominence bilaterally, which may reflect mild edema. Atypical/viral infectious process  not excluded. Electronically Signed   By: Davina Poke D.O.   On: 02/19/2020 17:49      IMPRESSION AND PLAN:   1.  Acute hypoxemic respiratory failure secondary to COVID-19. -The patient will be admitted to a medically monitored isolation bed. -O2 protocol will be followed to keep O2 saturation above 93.   2.  Multifocal pneumonia secondary to COVID-19. -The patient will be admitted to an isolation monitored bed with droplet and contact  precautions. -Given multifocal pneumonia we will empirically place the patient on IV Rocephin and Zithromax for possible bacterial superinfection only with elevated Procalcitonin. -The patient will be placed on scheduled Mucinex and as needed Tussionex. -We will avoid nebulization as much as we can, give bronchodilator MDI if needed, and with deterioration of oxygenation try to avoid BiPAP/CPAP if possible.    -Will obtain sputum Gram stain culture and sensitivity and follow blood cultures. -O2 protocol will be followed. -We will follow CRP, ferritin, LDH and D-dimer. -Will follow manual differential for ANC/ALC ratio as well as follow troponin I and daily CBC with manual differential and CMP. - Will place the patient on IV Remdesivir and IV steroid therapy with IV Solu-Medrol with elevated inflammatory markers. -The patient will be placed on vitamin D3, vitamin C, zinc sulfate, p.o. Pepcid and aspirin. -I discussed Baricitinib and the patient agreed to proceed with it.  3.  Acute kidney injury. -This is likely prerenal due to hypofall anemia due to her anorexia. -We will hydrate with IV normal saline and hold off nephrotoxins.  4. Chronic atrial fibrillation with controlled ventricular response. -We will continue her Lopressor. -We will continue her Xarelto.  5.  Essential hypertension. -We will continue her Lopressor.  6.  Depression. -We will continue her Celexa.  7.  GERD. -We will continue PPI therapy.  8.  Vitamin B12 and vitamin D deficiency. -We will continue her vitamin B12 and vitamin D.  9.  Gout. -We will continue allopurinol.  10.  DVT prophylaxis. -We will continue her Xarelto.    All the records are reviewed and case discussed with ED provider. The plan of care was discussed in details with the patient (and family). I answered all questions. The patient agreed to proceed with the above mentioned plan. Further management will depend upon hospital  course.   CODE STATUS: The patient is DNR/DNI.  She has an out of facility DNR form and I confirmed with her as well.  Status is: Inpatient  Remains inpatient appropriate because:Hemodynamically unstable, Ongoing diagnostic testing needed not appropriate for outpatient work up, Unsafe d/c plan, IV treatments appropriate due to intensity of illness or inability to take PO and Inpatient level of care appropriate due to severity of illness   Dispo: The patient is from: Home              Anticipated d/c is to: Home              Anticipated d/c date is: > 3 days              Patient currently is not medically stable to d/c.   TOTAL TIME TAKING CARE OF THIS PATIENT: 55 minutes.    Christel Mormon M.D on 02/21/2020 at 9:32 PM  [facility]   From 7 PM-7 AM, contact night-coverage [email or web address]  CC: Primary care physician; Valera Castle, MD

## 2020-01-31 NOTE — Progress Notes (Signed)
Remdesivir - Pharmacy Brief Note   O:  SpO2: mid 80's% on RA   A/P:  Remdesivir 200 mg IVPB once followed by 100 mg IVPB daily x 4 days.   Clovia Cuff, PharmD, BCPS Feb 26, 2020 6:58 PM

## 2020-01-31 NOTE — ED Notes (Addendum)
Spoke pt's son, Raenell Mensing, son requesting to speak with EDP at this time. Pt states that he wants to the speak with a doctor before we give the remdesivir.

## 2020-01-31 NOTE — ED Provider Notes (Signed)
[facility] Emergency Department Provider Note  ____________________________________________  Time seen: Approximately 9:47 PM  I have reviewed the triage vital signs and the nursing notes.   HISTORY  Chief Complaint Shortness of Breath  Level 5 Caveat: Portions of the History and Physical including HPI and review of systems are unable to be completely obtained due to patient being a poor historian    HPI Cynthia Terry is a 87 y.o. female with a history of hypertension, atrial fibrillation, COPD who is brought to the ED due to worsening shortness of breath.  Reportedly the patient had a positive home Covid test 2 days ago.  EMS report that on their arrival the patient's room air oxygen saturation was about 85%, she required 6 L nasal cannula to normalize her oxygen level.  Patient denies chest pain vomiting or diarrhea.      Past Medical History:  Diagnosis Date  . Atrial fibrillation (HCC)   . Hypertension      Patient Active Problem List   Diagnosis Date Noted  . Acute hypoxemic respiratory failure due to COVID-19 (HCC) 02/02/2020  . Moderate tricuspid insufficiency 07/18/2018  . SOBOE (shortness of breath on exertion) 04/11/2018  . Persistent atrial fibrillation (HCC) 04/12/2017  . Anxiety 02/27/2017  . Gastroesophageal reflux disease without esophagitis 02/27/2017  . COPD (chronic obstructive pulmonary disease) (HCC) 10/18/2016  . Acute idiopathic gout 09/18/2016  . Chronic pulmonary hypertension (HCC) 09/13/2015  . Vitamin D insufficiency 08/03/2011  . Arthritis 09/21/2010  . Polymyalgia rheumatica (HCC) 09/21/2010     History reviewed. No pertinent surgical history.   Prior to Admission medications   Medication Sig Start Date End Date Taking? Authorizing Provider  albuterol (VENTOLIN HFA) 108 (90 Base) MCG/ACT inhaler Inhale into the lungs every 6 (six) hours as needed for wheezing or shortness of breath.   Yes [provider]   allopurinol (ZYLOPRIM) 100 MG tablet Take 100 mg by mouth daily. 02/24/15  Yes [provider]  Cholecalciferol (VITAMIN D-3) 25 MCG (1000 UT) CAPS Take 2,000 Units by mouth daily.   Yes [provider]  citalopram (CELEXA) 10 MG tablet Take 10 mg by mouth daily.  10/28/14  Yes [provider]  furosemide (LASIX) 40 MG tablet Take 1 tablet by mouth daily. 03/03/19  Yes [provider]  metoprolol tartrate (LOPRESSOR) 50 MG tablet Take 50 mg by mouth 2 (two) times daily.  01/27/15  Yes [provider]  montelukast (SINGULAIR) 10 MG tablet Take 10 mg by mouth at bedtime.  02/05/15  Yes [provider]  omeprazole (PRILOSEC) 20 MG capsule Take 20 mg by mouth daily.  11/09/14  Yes [provider]  Rivaroxaban (XARELTO) 15 MG TABS tablet Take 15 mg by mouth daily with supper.  12/16/14  Yes [provider]  spironolactone (ALDACTONE) 25 MG tablet Take 1 tablet by mouth daily. 12/08/19  Yes [provider]  vitamin B-12 (CYANOCOBALAMIN) 1000 MCG tablet Take 1,000 mcg by mouth daily.   Yes [provider]  ammonium lactate (AMLACTIN) 12 % cream Apply 1 application topically 2 (two) times daily. 06/18/19   [provider]     Allergies Amlodipine and Clonidine derivatives   Family History  Problem Relation Age of Onset  . Heart disease Mother   . Renal cancer Father   . Heart disease Brother     Social History Social History   Tobacco Use  . Smoking status: Never Smoker  . Smokeless tobacco: Never Used  Substance Use Topics  . Alcohol use: No    Alcohol/week: 0.0 standard drinks  . Drug use: No    Review of Systems  Constitutional:   No fever or chills.  ENT:   No sore throat. No rhinorrhea. Cardiovascular:   No chest pain or syncope. Respiratory:   Positive shortness of breath and cough. Gastrointestinal:   Negative for abdominal pain, vomiting and diarrhea.  Musculoskeletal:   Negative  for focal pain or swelling All other systems reviewed and are negative except as documented above in ROS and HPI.  ____________________________________________   PHYSICAL EXAM:  VITAL SIGNS: ED Triage Vitals  Enc Vitals Group     BP 02/19/2020 1723 (!) 123/59     Pulse Rate 02/17/2020 1723 77     Resp 02/24/2020 1723 (!) 32     Temp 02/18/2020 1723 (!) 97.5 F (36.4 C)     Temp Source 02/16/2020 1723 Oral     SpO2 02/21/2020 1723 (!) 88 %     Weight 02/17/2020 1724 165 lb (74.8 kg)     Height 02/03/2020 1724 5' (1.524 m)     Head Circumference --      Peak Flow --      Pain Score 02/07/2020 1724 0     Pain Loc --      Pain Edu? --      Excl. in [place]? --     Vital signs reviewed, nursing assessments reviewed.   Constitutional:   Alert and oriented.  Ill-appearing. Eyes:   Conjunctivae are normal. EOMI. PERRL. ENT      Head:   Normocephalic and atraumatic.      Nose:   Wearing a mask.      Mouth/Throat:   Wearing a mask.      Neck:   No meningismus. Full ROM. Hematological/Lymphatic/Immunilogical:   No cervical lymphadenopathy. Cardiovascular:   RRR. Symmetric bilateral radial and DP pulses.  No murmurs. Cap refill less than 2 seconds. Respiratory:   Tachypnea with accessory muscle use.  Diffuse expiratory wheezing with prolonged expiratory phase.  Bilateral basilar crackles.. Gastrointestinal:   Soft and nontender. Non distended.   No rebound, rigidity, or guarding.  Musculoskeletal:   Normal range of motion in all extremities. No joint effusions.  No lower extremity tenderness.  No edema. Neurologic:   Normal speech and language.  Motor grossly intact. No acute focal neurologic deficits are appreciated.  Skin:    Skin is warm, dry and intact. No rash noted.  No petechiae, purpura, or bullae.  ____________________________________________    LABS (pertinent positives/negatives) (all labs ordered are listed, but only abnormal results are displayed) Labs Reviewed  CBC WITH  DIFFERENTIAL/PLATELET - Abnormal; Notable for the following components:      Result Value   RBC 3.58 (*)    Hemoglobin 11.7 (*)    HCT 33.9 (*)    All other components within normal limits  COMPREHENSIVE METABOLIC PANEL - Abnormal; Notable for the following components:   Sodium 125 (*)    Chloride 89 (*)    Glucose, Bld 132 (*)    BUN 42 (*)    Creatinine, Ser 1.69 (*)    Calcium 8.7 (*)    Albumin 3.3 (*)    GFR, Estimated 28 (*)    All other components within normal limits  MAGNESIUM - Abnormal; Notable for the following components:   Magnesium 1.4 (*)    All other components within normal limits  FERRITIN - Abnormal; Notable for the  following components:   Ferritin 362 (*)    All other components within normal limits  FIBRIN DERIVATIVES D-DIMER ([facility] ONLY) - Abnormal; Notable for the following components:   Fibrin derivatives D-dimer ([facility]) 1,563.70 (*)    All other components within normal limits  FIBRINOGEN - Abnormal; Notable for the following components:   Fibrinogen 657 (*)    All other components within normal limits  LACTATE DEHYDROGENASE - Abnormal; Notable for the following components:   LDH 228 (*)    All other components within normal limits  BRAIN NATRIURETIC PEPTIDE - Abnormal; Notable for the following components:   B Natriuretic Peptide 167.9 (*)    All other components within normal limits  POC SARS CORONAVIRUS 2 AG -  ED - Abnormal; Notable for the following components:   SARS Coronavirus 2 Ag POSITIVE (*)    All other components within normal limits  TROPONIN I (HIGH SENSITIVITY) - Abnormal; Notable for the following components:   Troponin I (High Sensitivity) 57 (*)    All other components within normal limits  LACTIC ACID, PLASMA  PROCALCITONIN  CBC WITH DIFFERENTIAL/PLATELET  COMPREHENSIVE METABOLIC PANEL  C-REACTIVE PROTEIN  FIBRIN DERIVATIVES D-DIMER ([facility] ONLY)  FERRITIN  C-REACTIVE PROTEIN  TROPONIN I (HIGH SENSITIVITY)    ____________________________________________   EKG  Interpreted by me Atrial fibrillation, rate of 88, left axis, left bundle branch block, no acute ischemic changes.  ____________________________________________    [id number]  DG Chest Portable 1 View  Result Date: 02/08/2020 CLINICAL DATA:  Shortness of breath EXAM: PORTABLE CHEST 1 VIEW COMPARISON:  12/15/2010 FINDINGS: Single lead left-sided implanted cardiac device. Stable cardiomegaly. Atherosclerotic calcification of the aortic knob. Mild diffuse interstitial prominence bilaterally. No focal airspace consolidation. No pleural effusion or pneumothorax. Severe degenerative changes of the bilateral shoulders. Multilevel thoracic spondylosis. IMPRESSION: Cardiomegaly with mild diffuse interstitial prominence bilaterally, which may reflect mild edema. Atypical/viral infectious process not excluded. Electronically Signed   By: Davina Poke D.O.   On: 02/18/2020 17:49    ____________________________________________   PROCEDURES .Critical Care Performed by: Carrie Mew, MD Authorized by: Carrie Mew, MD   Critical care provider statement:    Critical care time (minutes):  40   Critical care time was exclusive of:  Separately billable procedures and treating other patients   Critical care was necessary to treat or prevent imminent or life-threatening deterioration of the following conditions:  Respiratory failure   Critical care was time spent personally by me on the following activities:  Development of treatment plan with patient or surrogate, discussions with consultants, evaluation of patient's response to treatment, examination of patient, obtaining history from patient or surrogate, ordering and performing treatments and interventions, ordering and review of laboratory studies, ordering and review of radiographic studies, pulse oximetry, re-evaluation of patient's condition and review of old  charts    ____________________________________________  DIFFERENTIAL DIAGNOSIS   Pulmonary edema, pleural effusion, pneumonia, pneumothorax, COVID-19, COPD  CLINICAL IMPRESSION / ASSESSMENT AND PLAN / ED COURSE  Medications ordered in the ED: Medications  allopurinol (ZYLOPRIM) tablet 100 mg (has no administration in time range)  metoprolol tartrate (LOPRESSOR) tablet 50 mg (has no administration in time range)  citalopram (CELEXA) tablet 10 mg (has no administration in time range)  pantoprazole (PROTONIX) EC tablet 40 mg (has no administration in time range)  Rivaroxaban (XARELTO) tablet 15 mg (has no administration in time range)  vitamin B-12 (CYANOCOBALAMIN) tablet 1,000 mcg (has no administration in time range)  cholecalciferol (VITAMIN D) tablet 2,000 Units (  has no administration in time range)  albuterol (VENTOLIN HFA) 108 (90 Base) MCG/ACT inhaler 2 puff (has no administration in time range)  montelukast (SINGULAIR) tablet 10 mg (has no administration in time range)  ammonium lactate (LAC-HYDRIN) 12 % lotion (has no administration in time range)  0.9 %  sodium chloride infusion ( Intravenous New Bag/Given 02/16/2020 2149)  baricitinib (OLUMIANT) tablet 4 mg (has no administration in time range)  methylPREDNISolone sodium succinate (SOLU-MEDROL) 125 mg/2 mL injection 75 mg (has no administration in time range)    Followed by  predniSONE (DELTASONE) tablet 50 mg (has no administration in time range)  guaiFENesin-dextromethorphan (ROBITUSSIN DM) 100-10 MG/5ML syrup 10 mL (has no administration in time range)  chlorpheniramine-HYDROcodone (TUSSIONEX) 10-8 MG/5ML suspension 5 mL (has no administration in time range)  ascorbic acid (VITAMIN C) tablet 500 mg (has no administration in time range)  zinc sulfate capsule 220 mg (has no administration in time range)  famotidine (PEPCID) tablet 20 mg (has no administration in time range)  acetaminophen (TYLENOL) tablet 650 mg (has no  administration in time range)  traZODone (DESYREL) tablet 25 mg (has no administration in time range)  magnesium hydroxide (MILK OF MAGNESIA) suspension 30 mL (has no administration in time range)  ondansetron (ZOFRAN) tablet 4 mg (has no administration in time range)    Or  ondansetron (ZOFRAN) injection 4 mg (has no administration in time range)  aspirin EC tablet 81 mg (has no administration in time range)  guaiFENesin (MUCINEX) 12 hr tablet 600 mg (has no administration in time range)  dextromethorphan-guaiFENesin (MUCINEX DM) 30-600 MG per 12 hr tablet 1 tablet (has no administration in time range)  methylPREDNISolone sodium succinate (SOLU-MEDROL) 125 mg/2 mL injection 80 mg (80 mg Intravenous Given 02/18/2020 1823)  ipratropium-albuterol (DUONEB) 0.5-2.5 (3) MG/3ML nebulizer solution 3 mL (3 mLs Nebulization Given 02/06/2020 1824)  sodium chloride 0.9 % bolus 500 mL (0 mLs Intravenous Stopped 02/28/2020 2028)  magnesium sulfate IVPB 2 g 50 mL (0 g Intravenous Stopped 02/23/2020 2206)    Pertinent labs & imaging results that were available during my care of the patient were reviewed by me and considered in my medical decision making (see chart for details).  ERA SMALDONE was evaluated in Emergency Department on 02/01/2020 for the symptoms described in the history of present illness. She was evaluated in the context of the global COVID-19 pandemic, which necessitated consideration that the patient might be at risk for infection with the SARS-CoV-2 virus that causes COVID-19. Institutional protocols and algorithms that pertain to the evaluation of patients at risk for COVID-19 are in a state of rapid change based on information released by regulatory bodies including the CDC and federal and state organizations. These policies and algorithms were followed during the patient's care in the ED.   Patient presents with shortness of breath and hypoxia with wheezing.  Requiring 5 to 6 L nasal cannula to maintain  normoxia.  Patient still has increased work of breathing and some accessory muscle use, despite receiving 4 nebs by EMS.  Patient given IV Solu-Medrol and additional neb treatment in the ED, with persistent increased work of breathing.  Heated high flow nasal cannula started at 50 L, FiO2 60%, and patient is breathing much more comfortably, work of breathing improved, patient feels better.  ----------------------------------------- 9:49 PM on 02/09/2020 -----------------------------------------  Spoke with patient's son Tom regarding treatment plan.  He is agreeable to monoclonal antibody use, but refuses remdesivir on her behalf due to some "  scary things" that friends and family have told him about it.  Advised him that with his elderly mother's frail condition and comorbidities, she is at a high risk of further deterioration and death and would advise using all available therapeutics to help her.  He reports understanding but still refuses remdesivir.      ____________________________________________   FINAL CLINICAL IMPRESSION(S) / ED DIAGNOSES    Final diagnoses:  Pneumonia due to COVID-19 virus  Acute respiratory failure with hypoxia ([place])  Chronic obstructive pulmonary disease, unspecified COPD type ([place])  Chronic congestive heart failure, unspecified heart failure type [facility])     ED Discharge Orders    None      Portions of this note were generated with dragon dictation software. Dictation errors may occur despite best attempts at proofreading.   Carrie Mew, MD 02/01/20 0010

## 2020-01-31 NOTE — ED Triage Notes (Signed)
Pt via EMS from home. Pt dx with COVID with an at home test on Thursday 12/30. Pt has audible wheezing with EMS. Per EMS, pt RA saturation mid-80s, they placed on 6L . EMS gave 2 albuterol and 2 Duoneb. Pt is A&Ox4. Denies pain. Audible wheezing and tachypnea noted on arrival.

## 2020-01-31 NOTE — ED Notes (Signed)
Dr. Scotty Court, MD spoke with pt's son at this time. Per Dr. Scotty Court, family is refusing the Remdesivir at this time.

## 2020-02-01 DIAGNOSIS — J9601 Acute respiratory failure with hypoxia: Secondary | ICD-10-CM | POA: Diagnosis not present

## 2020-02-01 DIAGNOSIS — I214 Non-ST elevation (NSTEMI) myocardial infarction: Secondary | ICD-10-CM | POA: Diagnosis not present

## 2020-02-01 DIAGNOSIS — J1282 Pneumonia due to coronavirus disease 2019: Secondary | ICD-10-CM | POA: Diagnosis not present

## 2020-02-01 DIAGNOSIS — U071 COVID-19: Secondary | ICD-10-CM | POA: Diagnosis not present

## 2020-02-01 LAB — COMPREHENSIVE METABOLIC PANEL
ALT: 19 U/L (ref 0–44)
AST: 46 U/L — ABNORMAL HIGH (ref 15–41)
Albumin: 3.1 g/dL — ABNORMAL LOW (ref 3.5–5.0)
Alkaline Phosphatase: 57 U/L (ref 38–126)
Anion gap: 12 (ref 5–15)
BUN: 39 mg/dL — ABNORMAL HIGH (ref 8–23)
CO2: 21 mmol/L — ABNORMAL LOW (ref 22–32)
Calcium: 9 mg/dL (ref 8.9–10.3)
Chloride: 95 mmol/L — ABNORMAL LOW (ref 98–111)
Creatinine, Ser: 1.5 mg/dL — ABNORMAL HIGH (ref 0.44–1.00)
GFR, Estimated: 32 mL/min — ABNORMAL LOW (ref >60)
Glucose, Bld: 162 mg/dL — ABNORMAL HIGH (ref 70–99)
Potassium: 3.9 mmol/L (ref 3.5–5.1)
Sodium: 128 mmol/L — ABNORMAL LOW (ref 135–145)
Total Bilirubin: 0.6 mg/dL (ref 0.3–1.2)
Total Protein: 6.7 g/dL (ref 6.5–8.1)

## 2020-02-01 LAB — CBC WITH DIFFERENTIAL/PLATELET
Abs Immature Granulocytes: 0.01 10*3/uL (ref 0.00–0.07)
Basophils Absolute: 0 10*3/uL (ref 0.0–0.1)
Basophils Relative: 0 %
Eosinophils Absolute: 0 10*3/uL (ref 0.0–0.5)
Eosinophils Relative: 0 %
HCT: 34.9 % — ABNORMAL LOW (ref 36.0–46.0)
Hemoglobin: 12.5 g/dL (ref 12.0–15.0)
Immature Granulocytes: 1 %
Lymphocytes Relative: 11 %
Lymphs Abs: 0.2 10*3/uL — ABNORMAL LOW (ref 0.7–4.0)
MCH: 32.7 pg (ref 26.0–34.0)
MCHC: 35.8 g/dL (ref 30.0–36.0)
MCV: 91.4 fL (ref 80.0–100.0)
Monocytes Absolute: 0.1 10*3/uL (ref 0.1–1.0)
Monocytes Relative: 5 %
Neutro Abs: 1.8 10*3/uL (ref 1.7–7.7)
Neutrophils Relative %: 83 %
Platelets: 199 10*3/uL (ref 150–400)
RBC: 3.82 MIL/uL — ABNORMAL LOW (ref 3.87–5.11)
RDW: 13.2 % (ref 11.5–15.5)
WBC: 2.1 10*3/uL — ABNORMAL LOW (ref 4.0–10.5)
nRBC: 0 % (ref 0.0–0.2)

## 2020-02-01 LAB — FERRITIN: Ferritin: 393 ng/mL — ABNORMAL HIGH (ref 11–307)

## 2020-02-01 LAB — C-REACTIVE PROTEIN
CRP: 6.6 mg/dL — ABNORMAL HIGH (ref <1.0)
CRP: 6.1 mg/dL — ABNORMAL HIGH (ref <1.0)

## 2020-02-01 LAB — TROPONIN I (HIGH SENSITIVITY)
Troponin I (High Sensitivity): 1228 ng/L (ref <18)
Troponin I (High Sensitivity): 1483 ng/L (ref <18)

## 2020-02-01 LAB — FIBRIN DERIVATIVES D-DIMER (ARMC ONLY): Fibrin derivatives D-dimer (ARMC): 1829.36 ng/mL (FEU) — ABNORMAL HIGH (ref 0.00–499.00)

## 2020-02-01 MED ORDER — SODIUM CHLORIDE 0.9 % IV SOLN: 1.0000 g | INTRAVENOUS | Status: DC

## 2020-02-01 MED ORDER — SODIUM CHLORIDE 0.9 % IV SOLN
200.0000 mg | Freq: Once | INTRAVENOUS | Status: DC
  Filled 2020-02-01: qty 40

## 2020-02-01 MED ORDER — AZITHROMYCIN 500 MG PO TABS: 500.0000 mg | ORAL_TABLET | Freq: Every day | ORAL | Status: DC

## 2020-02-01 MED ORDER — APIXABAN 2.5 MG PO TABS
2.5000 mg | ORAL_TABLET | Freq: Two times a day (BID) | ORAL | Status: DC
  Administered 2020-02-02 (×2): 2.5 mg via ORAL
  Filled 2020-02-01 (×5): qty 1

## 2020-02-01 MED ORDER — SODIUM CHLORIDE 0.9 % IV SOLN: 100.0000 mg | Freq: Every day | INTRAVENOUS | Status: DC

## 2020-02-01 NOTE — Progress Notes (Signed)
Remdesivir - Pharmacy Brief Note Order interrupted 1/1 and no previous dose received.  "Spoke with patient's son Cynthia Terry regarding treatment plan.  He is agreeable to monoclonal antibody use, but refuses remdesivir on her behalf due to some "scary things" that friends and family have told him about it.  Advised him that with his elderly mother's frail condition and comorbidities, she is at a high risk of further deterioration and death and would advise using all available therapeutics to help her.  He reports understanding but still refuses remdesivir.   O:  SpO2: mid 80's% on RA   A/P:  Pharmacy reconsulted for remdesivir dosing. (See above for details) Remdesivir 200 mg IVPB once followed by 100 mg IVPB daily x 4 days.   Martyn Malay, [facility] 02/01/2020 2:24 PM

## 2020-02-01 NOTE — ED Notes (Signed)
Admitting MD Zhang at bedside at this time

## 2020-02-01 NOTE — ED Notes (Signed)
Pt given meal tray at this time 

## 2020-02-01 NOTE — ED Notes (Signed)
Meal tray given at this time 

## 2020-02-01 NOTE — Progress Notes (Addendum)
PROGRESS NOTE    Cynthia Terry  [id number] DOB: 12/25/1924 DOA: 02/23/2020 PCP: Valera Castle, MD   Chief complaint.  Shortness of breath Brief Narrative:  Cynthia Terry  is a 87 y.o. Caucasian female with a known history of chronic atrial fibrillation and hypertension, presented to the emergency room with acute onset of worsening dyspnea with associated dry cough and occasional wheezing over the last couple weeks.  She admitted to fatigue and tiredness and has not had much appetite.  She has been feeling sleepy. Upon arriving the emergency room, she was tested positive for Covid.  She developed severe hypoxemia, she required 50% oxygen.  EKG also showed atrial fibrillation.  Chest x-ray showed interstitial changes.  She also had elevation of procalcitonin level at 0.25, lactic acidosis.   She is treated with remdesivir and steroids.  Assessment & Plan:   Active Problems:   Acute hypoxemic respiratory failure due to COVID-19 ([place])  #1.  Acute hypoxemic respiratory failure secondary to COVID-19. Multifocal pneumonia secondary to COVID-19. I reviewed patient chest x-ray images, patient had diffuse interstitial changes, not typical of Covid.  Currently, she has a severe hypoxemia, on 50% oxygen.  Her BNP not significant elevated, no evidence of volume overload.  Her procalcitonin level 0.25, will recheck level tomorrow before start antibiotics. Obtain speech therapy evaluation to rule out aspiration. I will continue coverage with IV steroids and remdesivir. Baricitinib is contraindicated due to renal function.  #2.  Hyponatremia. Chronic kidney disease stage IIIb. Reviewed labs from outside hospital, baseline creatinine 1.5.  Still stable. Hyponatremia appears to be secondary to mild dehydration. She has received IV fluids, will continue to follow sodium level.  3.  Chronic atrial fibrillation. Continue anticoagulation.  Due to chronic kidney disease, will switch to  Eliquis.  #4 essential hypertension. Continue some home medicines.  5.  Non-STEMI. Secondary to demand ischemia from Covid and hypoxemia. Cardiology consulted, Dr. Clayborn Bigness notified.  #6.  Hypomagnesemia. Supplemented.  Addendum: Patient refused remdesivir    DVT prophylaxis: eLIQUIS Code Status: DNR Family Communication: son updated Disposition Plan:  .   Status is: Inpatient  Remains inpatient appropriate because:Inpatient level of care appropriate due to severity of illness   Dispo: The patient is from: Home              Anticipated d/c is to: Home              Anticipated d/c date is: > 3 days              Patient currently is not medically stable to d/c.        I/O last 3 completed shifts: In: 550 [IV Piggyback:550] Out: -  No intake/output data recorded.     Consultants:   cardiology  Procedures: none  Antimicrobials: none  Subjective: Patient still has significant hypoxia, on 50% oxygen.  She has short of breath with minimal exertion.  She has a cough, with clear mucus. She denies any abdominal pain or nausea vomiting. No fever or chills. No dysuria or hematuria.  Objective: Vitals:   02/01/20 1000 02/01/20 1013 02/01/20 1130 02/01/20 1141  BP: (!) 135/107 (!) 135/107 124/73   Pulse: 99  72   Resp: 18  (!) 25   Temp:      TempSrc:      SpO2: 95%  97% 95%  Weight:      Height:        Intake/Output Summary (Last 24 hours) at 02/01/2020 1334  Last data filed at 02/01/2020 2206 Gross per 24 hour  Intake 550 ml  Output --  Net 550 ml   Filed Weights   02/09/2020 1724  Weight: 74.8 kg    Examination:  General exam: Appears calm and comfortable  Respiratory system: Crackles in the base. Respiratory effort normal. Cardiovascular system: Irregular. No JVD, murmurs, rubs, gallops or clicks. No pedal edema. Gastrointestinal system: Abdomen is nondistended, soft and nontender. No organomegaly or masses felt. Normal bowel sounds  heard. Central nervous system: Alert and oriented x2. No focal neurological deficits. Extremities: Symmetric 5 x 5 power. Skin: No rashes, lesions or ulcers Psychiatry: Mood & affect appropriate.     Data Reviewed: I have personally reviewed following labs and imaging studies  CBC: Recent Labs  Lab 02/19/2020 1730 02/01/20 0608  WBC 4.9 2.1*  NEUTROABS 3.6 1.8  HGB 11.7* 12.5  HCT 33.9* 34.9*  MCV 94.7 91.4  PLT 206 199   Basic Metabolic Panel: Recent Labs  Lab 02/24/2020 1727 02/01/20 0608  NA 125* 128*  K 3.9 3.9  CL 89* 95*  CO2 23 21*  GLUCOSE 132* 162*  BUN 42* 39*  CREATININE 1.69* 1.50*  CALCIUM 8.7* 9.0  MG 1.4*  --    GFR: Estimated Creatinine Clearance: 21.2 mL/min (A) (by C-G formula based on SCr of 1.5 mg/dL (H)). Liver Function Tests: Recent Labs  Lab 02/06/2020 1727 02/01/20 0608  AST 37 46*  ALT 17 19  ALKPHOS 52 57  BILITOT 0.7 0.6  PROT 6.6 6.7  ALBUMIN 3.3* 3.1*   No results for input(s): LIPASE, AMYLASE in the last 168 hours. No results for input(s): AMMONIA in the last 168 hours. Coagulation Profile: No results for input(s): INR, PROTIME in the last 168 hours. Cardiac Enzymes: No results for input(s): CKTOTAL, CKMB, CKMBINDEX, TROPONINI in the last 168 hours. BNP (last 3 results) No results for input(s): PROBNP in the last 8760 hours. HbA1C: No results for input(s): HGBA1C in the last 72 hours. CBG: No results for input(s): GLUCAP in the last 168 hours. Lipid Profile: No results for input(s): CHOL, HDL, LDLCALC, TRIG, CHOLHDL, LDLDIRECT in the last 72 hours. Thyroid Function Tests: No results for input(s): TSH, T4TOTAL, FREET4, T3FREE, THYROIDAB in the last 72 hours. Anemia Panel: Recent Labs    02/21/2020 2201 02/01/20 0608  FERRITIN 362* 393*   Sepsis Labs: Recent Labs  Lab 02/21/2020 1727 02/29/2020 1730  PROCALCITON 0.25  --   LATICACIDVEN  --  1.7    No results found for this or any previous visit (from the past 240  hour(s)).       Radiology Studies: DG Chest Portable 1 View  Result Date: 02/29/2020 CLINICAL DATA:  Shortness of breath EXAM: PORTABLE CHEST 1 VIEW COMPARISON:  12/15/2010 FINDINGS: Single lead left-sided implanted cardiac device. Stable cardiomegaly. Atherosclerotic calcification of the aortic knob. Mild diffuse interstitial prominence bilaterally. No focal airspace consolidation. No pleural effusion or pneumothorax. Severe degenerative changes of the bilateral shoulders. Multilevel thoracic spondylosis. IMPRESSION: Cardiomegaly with mild diffuse interstitial prominence bilaterally, which may reflect mild edema. Atypical/viral infectious process not excluded. Electronically Signed   By: Duanne Guess D.O.   On: 02/01/2020 17:49        Scheduled Meds: . allopurinol  100 mg Oral Daily  . ammonium lactate   Topical BID  . vitamin C  500 mg Oral Daily  . aspirin EC  81 mg Oral Daily  . baricitinib  4 mg Oral Daily  . cholecalciferol  2,000 Units Oral Daily  . citalopram  10 mg Oral Daily  . dextromethorphan-guaiFENesin  1 tablet Oral BID  . famotidine  20 mg Oral BID  . guaiFENesin  600 mg Oral BID  . methylPREDNISolone (SOLU-MEDROL) injection  1 mg/kg Intravenous Q12H   Followed by  . [START ON 02/04/2020] predniSONE  50 mg Oral Daily  . metoprolol tartrate  50 mg Oral BID  . montelukast  10 mg Oral QHS  . pantoprazole  40 mg Oral Daily  . Rivaroxaban  15 mg Oral Q supper  . vitamin B-12  1,000 mcg Oral Daily  . zinc sulfate  220 mg Oral Daily   Continuous Infusions: . sodium chloride 75 mL/hr at 02/01/20 1150     LOS: 1 day    Time spent: 34 minutes    Sharen Hones, MD [facility]   To contact the attending provider between 7A-7P or the covering provider during after hours 7P-7A, please log into the web site [email or web address] and access using universal [place] password for that web site. If you do not have the password, please call the hospital  operator.  02/01/2020, 1:34 PM

## 2020-02-01 NOTE — ED Notes (Signed)
Messaged Admitting MD Zhang regarding pt remdesivir at this time. Awaiting response at this time.

## 2020-02-01 NOTE — ED Notes (Signed)
Pharmacy called at this time about missing eliquis dose. Per pharmacy, will send med.

## 2020-02-01 NOTE — ED Notes (Signed)
Took over care of pt. Pt resting comfortably. Pt has no requests or complaints at this time. VSS. Awaiting further orders. Will continue to monitor.  

## 2020-02-01 NOTE — ED Notes (Signed)
Pt put on bedpan at this time

## 2020-02-01 NOTE — ED Notes (Signed)
Pt son updated at this time

## 2020-02-01 NOTE — ED Notes (Signed)
Date and time results received: 02/01/20 "7:23 AM (use smartphrase ".now" to insert current time)  Test: Troponin Critical Value: 1228  Name of Provider Notified: Chipper Herb MD  Orders Received? Or Actions Taken?: Orders Received - See Orders for details

## 2020-02-01 NOTE — ED Notes (Signed)
Patient resting in stretcher. Cardiac monitor on, BP cuff cycling q30 minutes.  NAD noted, VSS. Call bell within reach.

## 2020-02-01 NOTE — ED Notes (Signed)
Patient placed on purewick d/t incontinence.  Patients pants and wet pull up removed. Patient cleaned of incontinent urine by this RN and Irving Burton, RN. Patient repositioned for comfort.

## 2020-02-01 NOTE — ED Notes (Signed)
Patient given water by this RN per request. Patient repositioned.

## 2020-02-01 NOTE — ED Notes (Signed)
Pt cleaned, clean linens, chux, purewick and brief placed at this time. Pt given blanket and cup of water at this time.

## 2020-02-01 NOTE — ED Notes (Signed)
This RN to bedside at this time. Pt visualized resting in bed comfortably with eyes closed. Respirations even and unlabored, NAD noted. Lights dimmed for pt comfort at this time. Will introduce self to pt at later time.

## 2020-02-01 NOTE — Consult Note (Signed)
ANTICOAGULATION CONSULT NOTE - Consult  Pharmacy Consult for Xarelto --> Apixaban transition Indication: atrial fibrillation  Allergies  Allergen Reactions  . Amlodipine     swelling  . Clonidine Derivatives     dizziness    Patient Measurements: Height: 5' (152.4 cm) Weight: 74.8 kg (165 lb) IBW/kg (Calculated) : 45.5  Vital Signs: BP: 124/73 (01/02 1130) Pulse Rate: 72 (01/02 1130)  Labs: Recent Labs    02-03-2020 1727 Feb 03, 2020 1730 2020-02-03 2201 02/01/20 0608 02/01/20 1357  HGB  --  11.7*  --  12.5  --   HCT  --  33.9*  --  34.9*  --   PLT  --  206  --  199  --   CREATININE 1.69*  --   --  1.50*  --   TROPONINIHS  --   --  57* 1,228* 1,483*    Estimated Creatinine Clearance: 21.2 mL/min (A) (by C-G formula based on SCr of 1.5 mg/dL (H)).   Medications:  No AC pertinent drug allergies.  PTA: xarelto 15mg  QD (b/n 12-3pm), celexa IP: ASA 81,  enoxaparin 40mg  q24h, celexa, prednisone 50mg  QD,   Assessment: 87yo WF with PMH sig for Afib and HTN presenting to ED with acute onset worsening dyspnea with dry cough and wheezing x2 weeks. Patient chronically anticoagulated prior to admission with xarelto 15mg  QD, but on this admission will transition to Eliquis d/t c/f renal function. Pharmacy consulted to assist in transition.  Last dose of xarelto 12/31 and has only received prophylactic lovenox and ASA 81mg  during inpatient stay. Will start Apixaban at 2.5mg  BID given Age >80; Scr >/= 1.5.  Creatinine: 1.69>1.5 Hgb: 11.7>12.5; Plt 206>199  Goal of Therapy:  Monitor platelets by anticoagulation protocol: Yes   Plan:  Lovenox 40mg  q24 --> d/c Initiate Eliquis 2.5mg  BID starting now. CTM H/H   Sriya Kroeze 02/01/2020,2:31 PM

## 2020-02-01 NOTE — ED Notes (Signed)
Pt given meal tray by Barbee Cough RN at this time

## 2020-02-01 NOTE — ED Notes (Signed)
Messaged admitting MD to call this RN back regarding critical lab value at this time.

## 2020-02-01 NOTE — Consult Note (Signed)
CARDIOLOGY CONSULT NOTE               Patient ID: Cynthia Terry MRN: [id number] DOB/AGE: 1924/07/10 87 y.o.  Admit date: 02/22/2020 Referring Physician Dr. Garner Gavel hospitalist Primary Physician Dr. Kym Groom primary Primary Cardiologist Dr. Serafina Royals Reason for Consultation elevated troponins possible non-STEMI chronic atrial fibrillation  HPI: Patient presented with significant shortness of breath dyspnea hypoxemia was diagnosed with COVID-19 and was hypoxic and weak and fatigued.  Patient had worsening dyspnea with dry cough and wheezing decreased appetite fatigue somnolence when she presented to the emergency room sats were in the 80s she was placed on supplemental oxygen with no evidence of fever denies any chest pain but with the tenuous presentation she was admitted for Covid pneumonia treated with 1 days of air as well as steroids and advised to be admitted unfortunately her troponins increased to over thousand cardiology was then consulted.  No previous history of coronary disease just atrial fibrillation maintained on beta-blocker and Xarelto  Review of systems complete and found to be negative unless listed above     Past Medical History:  Diagnosis Date  . Atrial fibrillation ([place])   . Hypertension     History reviewed. No pertinent surgical history.  (Not in a hospital admission)  Social History   Socioeconomic History  . Marital status: Widowed    Spouse name: Not on file  . Number of children: Not on file  . Years of education: Not on file  . Highest education level: Not on file  Occupational History  . Not on file  Tobacco Use  . Smoking status: Never Smoker  . Smokeless tobacco: Never Used  Substance and Sexual Activity  . Alcohol use: No    Alcohol/week: 0.0 standard drinks  . Drug use: No  . Sexual activity: Not on file  Other Topics Concern  . Not on file  Social History Narrative  . Not on file   Social Determinants of Health   Financial  Resource Strain: Not on file  Food Insecurity: Not on file  Transportation Needs: Not on file  Physical Activity: Not on file  Stress: Not on file  Social Connections: Not on file  Intimate Partner Violence: Not on file    Family History  Problem Relation Age of Onset  . Heart disease Mother   . Renal cancer Father   . Heart disease Brother       Review of systems complete and found to be negative unless listed above      PHYSICAL EXAM  General: Well developed, well nourished, in no acute distress HEENT:  Normocephalic and atramatic Neck:  No JVD.  Lungs: Diffuse rhonchi bilaterally to auscultation and percussion. Heart: HRRR . Normal S1 and S2 without gallops or murmurs.  Abdomen: Bowel sounds are positive, abdomen soft and non-tender  Msk:  Back normal, normal gait. Normal strength and tone for age. Extremities: No clubbing, cyanosis or edema.   Neuro: Alert and oriented X 3. Psych:  Good affect, responds appropriately  Labs:   Lab Results  Component Value Date   WBC 2.1 (L) 02/01/2020   HGB 12.5 02/01/2020   HCT 34.9 (L) 02/01/2020   MCV 91.4 02/01/2020   PLT 199 02/01/2020    Recent Labs  Lab 02/01/20 0608  NA 128*  K 3.9  CL 95*  CO2 21*  BUN 39*  CREATININE 1.50*  CALCIUM 9.0  PROT 6.7  BILITOT 0.6  ALKPHOS 57  ALT 19  AST 46*  GLUCOSE 162*   No results found for: CKTOTAL, CKMB, CKMBINDEX, TROPONINI No results found for: CHOL No results found for: HDL No results found for: LDLCALC No results found for: TRIG No results found for: CHOLHDL No results found for: LDLDIRECT    Radiology: DG Chest Portable 1 View  Result Date: 02/09/2020 CLINICAL DATA:  Shortness of breath EXAM: PORTABLE CHEST 1 VIEW COMPARISON:  12/15/2010 FINDINGS: Single lead left-sided implanted cardiac device. Stable cardiomegaly. Atherosclerotic calcification of the aortic knob. Mild diffuse interstitial prominence bilaterally. No focal airspace consolidation. No pleural  effusion or pneumothorax. Severe degenerative changes of the bilateral shoulders. Multilevel thoracic spondylosis. IMPRESSION: Cardiomegaly with mild diffuse interstitial prominence bilaterally, which may reflect mild edema. Atypical/viral infectious process not excluded. Electronically Signed   By: Duanne Guess D.O.   On: 02/21/2020 17:49    EKG: Atrial fibrillation rate of 65 interventricular conduction delay nonspecific ST-T wave changes  ASSESSMENT AND PLAN:  Elevated troponin Possible non-STEMI versus myocarditis Covid pneumonia Shortness of breath Atrial fibrillation Hypertension Hypoxemia Acute on chronic renal insufficiency  Plan Continue aggressive therapy for Covid pneumonia and hypoxemia with supplemental oxygen inhalers potentially antibodies Placed on broad-spectrum antibiotic therapy Rocephin and Zithromax Continue nebulizers and/or BiPAP to help with respiratory failure Continue to follow-up troponins Agree with remdesivir IV steroid therapy for Covid pneumonia Agree with Baricitinib to help with current condition Maintain Xarelto therapy for atrial fibrillation Lopressor should be used for rate control for atrial fibrillation Continue PPI therapy for reflux symptoms Mild hydration for renal insufficiency Echocardiogram will be helpful for overall left ventricular function and wall motion with elevated troponin I do not anticipate a invasive strategy for this case  Signed: Alwyn Pea MD 02/01/2020, 1:30 PM

## 2020-02-02 ENCOUNTER — Inpatient Hospital Stay: Admit: 2020-02-02 | Payer: PPO

## 2020-02-02 DIAGNOSIS — U071 COVID-19: Principal | ICD-10-CM

## 2020-02-02 DIAGNOSIS — I214 Non-ST elevation (NSTEMI) myocardial infarction: Secondary | ICD-10-CM

## 2020-02-02 DIAGNOSIS — J449 Chronic obstructive pulmonary disease, unspecified: Secondary | ICD-10-CM | POA: Diagnosis not present

## 2020-02-02 DIAGNOSIS — J80 Acute respiratory distress syndrome: Secondary | ICD-10-CM

## 2020-02-02 DIAGNOSIS — J9601 Acute respiratory failure with hypoxia: Secondary | ICD-10-CM | POA: Diagnosis not present

## 2020-02-02 LAB — CBC WITH DIFFERENTIAL/PLATELET
Abs Immature Granulocytes: 0.01 10*3/uL (ref 0.00–0.07)
Basophils Absolute: 0 10*3/uL (ref 0.0–0.1)
Basophils Relative: 0 %
Eosinophils Absolute: 0 10*3/uL (ref 0.0–0.5)
Eosinophils Relative: 0 %
HCT: 36.6 % (ref 36.0–46.0)
Hemoglobin: 12.6 g/dL (ref 12.0–15.0)
Immature Granulocytes: 0 %
Lymphocytes Relative: 16 %
Lymphs Abs: 0.5 10*3/uL — ABNORMAL LOW (ref 0.7–4.0)
MCH: 32.5 pg (ref 26.0–34.0)
MCHC: 34.4 g/dL (ref 30.0–36.0)
MCV: 94.3 fL (ref 80.0–100.0)
Monocytes Absolute: 0.2 10*3/uL (ref 0.1–1.0)
Monocytes Relative: 7 %
Neutro Abs: 2.2 10*3/uL (ref 1.7–7.7)
Neutrophils Relative %: 77 %
Platelets: 238 10*3/uL (ref 150–400)
RBC: 3.88 MIL/uL (ref 3.87–5.11)
RDW: 13.1 % (ref 11.5–15.5)
WBC: 2.9 10*3/uL — ABNORMAL LOW (ref 4.0–10.5)
nRBC: 0 % (ref 0.0–0.2)

## 2020-02-02 LAB — COMPREHENSIVE METABOLIC PANEL
ALT: 21 U/L (ref 0–44)
AST: 46 U/L — ABNORMAL HIGH (ref 15–41)
Albumin: 3.4 g/dL — ABNORMAL LOW (ref 3.5–5.0)
Alkaline Phosphatase: 60 U/L (ref 38–126)
Anion gap: 11 (ref 5–15)
BUN: 47 mg/dL — ABNORMAL HIGH (ref 8–23)
CO2: 24 mmol/L (ref 22–32)
Calcium: 9.2 mg/dL (ref 8.9–10.3)
Chloride: 93 mmol/L — ABNORMAL LOW (ref 98–111)
Creatinine, Ser: 1.45 mg/dL — ABNORMAL HIGH (ref 0.44–1.00)
GFR, Estimated: 34 mL/min — ABNORMAL LOW (ref >60)
Glucose, Bld: 113 mg/dL — ABNORMAL HIGH (ref 70–99)
Potassium: 4.7 mmol/L (ref 3.5–5.1)
Sodium: 128 mmol/L — ABNORMAL LOW (ref 135–145)
Total Bilirubin: 0.7 mg/dL (ref 0.3–1.2)
Total Protein: 6.9 g/dL (ref 6.5–8.1)

## 2020-02-02 LAB — FERRITIN: Ferritin: 464 ng/mL — ABNORMAL HIGH (ref 11–307)

## 2020-02-02 LAB — C-REACTIVE PROTEIN: CRP: 4.4 mg/dL — ABNORMAL HIGH (ref <1.0)

## 2020-02-02 LAB — TROPONIN I (HIGH SENSITIVITY): Troponin I (High Sensitivity): 1015 ng/L (ref <18)

## 2020-02-02 LAB — GLUCOSE, CAPILLARY: Glucose-Capillary: 113 mg/dL — ABNORMAL HIGH (ref 70–99)

## 2020-02-02 LAB — FIBRIN DERIVATIVES D-DIMER (ARMC ONLY): Fibrin derivatives D-dimer (ARMC): 2158.88 ng/mL (FEU) — ABNORMAL HIGH (ref 0.00–499.00)

## 2020-02-02 LAB — PROCALCITONIN: Procalcitonin: 0.25 ng/mL

## 2020-02-02 MED ORDER — AZITHROMYCIN 500 MG PO TABS
500.0000 mg | ORAL_TABLET | Freq: Every day | ORAL | Status: DC
  Administered 2020-02-02 – 2020-02-03 (×2): 500 mg via ORAL
  Filled 2020-02-02 (×2): qty 1

## 2020-02-02 MED ORDER — SPIRONOLACTONE 25 MG PO TABS
25.0000 mg | ORAL_TABLET | Freq: Every day | ORAL | Status: DC
  Administered 2020-02-02 – 2020-02-04 (×3): 25 mg via ORAL
  Filled 2020-02-02 (×3): qty 1

## 2020-02-02 MED ORDER — FUROSEMIDE 40 MG PO TABS
40.0000 mg | ORAL_TABLET | Freq: Every day | ORAL | Status: DC
  Administered 2020-02-02 – 2020-02-05 (×4): 40 mg via ORAL
  Filled 2020-02-02 (×4): qty 1

## 2020-02-02 MED ORDER — SODIUM CHLORIDE 0.9 % IV SOLN
1.0000 g | INTRAVENOUS | Status: DC
  Administered 2020-02-02 – 2020-02-03 (×2): 1 g via INTRAVENOUS
  Filled 2020-02-02: qty 1
  Filled 2020-02-02: qty 10

## 2020-02-02 MED ORDER — IPRATROPIUM-ALBUTEROL 20-100 MCG/ACT IN AERS
1.0000 | INHALATION_SPRAY | Freq: Four times a day (QID) | RESPIRATORY_TRACT | Status: DC | PRN
  Filled 2020-02-02: qty 4

## 2020-02-02 NOTE — Progress Notes (Signed)
SLP Cancellation Note  Patient Details Name: Cynthia Terry MRN: [id number] DOB: 1924/12/12   Cancelled treatment:       Reason Eval/Treat Not Completed: SLP screened, no needs identified, will sign off   Chart reviewed, nursing consulted and pt briefly observed when consuming regular lunch tray with nursing. No overt s/s of aspiration were identified.   Shara Hartis B. Dreama Saa M.S., CCC-SLP, [facility] Speech-Language Pathologist Rehabilitation Services Office [phone]    Reuel Derby 02/02/2020, 4:10 PM

## 2020-02-02 NOTE — ED Notes (Signed)
Pt changed to green tubing [place] on 10L at this time

## 2020-02-02 NOTE — Progress Notes (Signed)
PROGRESS NOTE    MERILEE Terry  [id number] DOB: May 17, 1924 DOA: 02/20/2020 PCP: Valera Castle, MD   Chief complaint: shortness of breath Brief Narrative:  Cynthia Terry [phone] y.o.Caucasian femalewith a known history of chronicatrial fibrillation and hypertension, presented to the emergency room with acute onset of worsening dyspnea with associateddry cough and occasional wheezing over the last couple weeks. She admitted to fatigue and tiredness and has not had much appetite. She has been feeling sleepy. Upon arriving the emergency room, she was tested positive for Covid.  She developed severe hypoxemia, she required 50% oxygen.  EKG also showed atrial fibrillation.  Chest x-ray showed interstitial changes.  She also had elevation of procalcitonin level at 0.25, lactic acidosis.   She is treated with remdesivir and steroids.    Assessment & Plan:   Active Problems:   Acute hypoxemic respiratory failure due to COVID-19 [facility])  #1 acute hypoxemic respiratory failure secondary to COVID-19 pneumonia. Multifocal pneumonia secondary to COVID-19 infection. Based on chest x-ray, patient has severe interstitial changes. She has a severe hypoxemia, she does not have acute exacerbation. of congestive heart failure. Her procalcitonin level still at 0.25, due to severity of her medical condition, I will cover with antibiotics for possible bacterial pneumonia. Continue IV steroids.  Baricitinib is contraindicated due to worsening renal function. Discussed with patient and his son again, the son clearly states that they do not want remdesivir ('I have done my own research"). Patient condition is critical, continue high flow oxygen.  Continue monitor closely.  Patient has very high risk of mortality.  #2.  Chronic atrial fibrillation. Continue Eliquis.  3.  Hyponatremia. Chronic kidney disease stage IIIb. Renal function still stable.  4.  Non-STEMI. Appreciate cardiology  consult.  Patient is not a candidate for intervention. Pending echocardiogram.     DVT prophylaxis: Eliquis Code Status: DNR Family Communication: Son Updated. Disposition Plan:  .   Status is: Inpatient  Remains inpatient appropriate because:Inpatient level of care appropriate due to severity of illness   Dispo: The patient is from: Home              Anticipated d/c is to: Home              Anticipated d/c date is: > 3 days              Patient currently is not medically stable to d/c.        I/O last 3 completed shifts: In: 790 [P.O.:240; IV Piggyback:550] Out: 1900 [Urine:1900] No intake/output data recorded.     Consultants:   Cardiology  Procedures: None  Antimicrobials: Rocephin, Zithromax  Subjective: Patient has some confusion, still complaining significant short of breath with minimal exertion.  Still on high flow oxygen. Denies abdominal pain or nausea vomiting. No dysuria hematuria pain No fever or chills.  Objective: Vitals:   02/02/20 0841 02/02/20 0930 02/02/20 1030 02/02/20 1100  BP:  116/61 125/83 111/88  Pulse:  65 65 65  Resp:  15 (!) 22 15  Temp:      TempSrc:      SpO2: 93% 92% 97% 100%  Weight:      Height:        Intake/Output Summary (Last 24 hours) at 02/02/2020 1344 Last data filed at 02/02/2020 0554 Gross per 24 hour  Intake 240 ml  Output 1900 ml  Net -1660 ml   Filed Weights   02/06/2020 1724  Weight: 74.8 kg    Examination:  General  exam: Ill-appearing, frail. Respiratory system: Some crackles in the base. Respiratory effort normal. Cardiovascular system: S1 & S2 heard, RRR. No JVD, murmurs, rubs, gallops or clicks. No pedal edema. Gastrointestinal system: Abdomen is nondistended, soft and nontender. No organomegaly or masses felt. Normal bowel sounds heard. Central nervous system: Alert and oriented x2. No focal neurological deficits. Extremities: Symmetric 5 x 5 power. Skin: No rashes, lesions or  ulcers Psychiatry:  Mood & affect appropriate.     Data Reviewed: I have personally reviewed following labs and imaging studies  CBC: Recent Labs  Lab 02/08/2020 1730 02/01/20 0608 02/02/20 0309  WBC 4.9 2.1* 2.9*  NEUTROABS 3.6 1.8 2.2  HGB 11.7* 12.5 12.6  HCT 33.9* 34.9* 36.6  MCV 94.7 91.4 94.3  PLT 206 199 238   Basic Metabolic Panel: Recent Labs  Lab 02/07/2020 1727 02/01/20 0608 02/02/20 0309  NA 125* 128* 128*  K 3.9 3.9 4.7  CL 89* 95* 93*  CO2 23 21* 24  GLUCOSE 132* 162* 113*  BUN 42* 39* 47*  CREATININE 1.69* 1.50* 1.45*  CALCIUM 8.7* 9.0 9.2  MG 1.4*  --   --    GFR: Estimated Creatinine Clearance: 21.9 mL/min (A) (by C-G formula based on SCr of 1.45 mg/dL (H)). Liver Function Tests: Recent Labs  Lab 02/20/2020 1727 02/01/20 0608 02/02/20 0309  AST 37 46* 46*  ALT 17 19 21   ALKPHOS 52 57 60  BILITOT 0.7 0.6 0.7  PROT 6.6 6.7 6.9  ALBUMIN 3.3* 3.1* 3.4*   No results for input(s): LIPASE, AMYLASE in the last 168 hours. No results for input(s): AMMONIA in the last 168 hours. Coagulation Profile: No results for input(s): INR, PROTIME in the last 168 hours. Cardiac Enzymes: No results for input(s): CKTOTAL, CKMB, CKMBINDEX, TROPONINI in the last 168 hours. BNP (last 3 results) No results for input(s): PROBNP in the last 8760 hours. HbA1C: No results for input(s): HGBA1C in the last 72 hours. CBG: No results for input(s): GLUCAP in the last 168 hours. Lipid Profile: No results for input(s): CHOL, HDL, LDLCALC, TRIG, CHOLHDL, LDLDIRECT in the last 72 hours. Thyroid Function Tests: No results for input(s): TSH, T4TOTAL, FREET4, T3FREE, THYROIDAB in the last 72 hours. Anemia Panel: Recent Labs    02/01/20 0608 02/02/20 0309  FERRITIN 393* 464*   Sepsis Labs: Recent Labs  Lab 02/06/2020 1727 02/20/2020 1730 02/02/20 0309  PROCALCITON 0.25  --  0.25  LATICACIDVEN  --  1.7  --     No results found for this or any previous visit (from the past  240 hour(s)).       Radiology Studies: DG Chest Portable 1 View  Result Date: 02/20/2020 CLINICAL DATA:  Shortness of breath EXAM: PORTABLE CHEST 1 VIEW COMPARISON:  12/15/2010 FINDINGS: Single lead left-sided implanted cardiac device. Stable cardiomegaly. Atherosclerotic calcification of the aortic knob. Mild diffuse interstitial prominence bilaterally. No focal airspace consolidation. No pleural effusion or pneumothorax. Severe degenerative changes of the bilateral shoulders. Multilevel thoracic spondylosis. IMPRESSION: Cardiomegaly with mild diffuse interstitial prominence bilaterally, which may reflect mild edema. Atypical/viral infectious process not excluded. Electronically Signed   By: 12/17/2010 D.O.   On: 02/28/2020 17:49        Scheduled Meds: . allopurinol  100 mg Oral Daily  . ammonium lactate   Topical BID  . apixaban  2.5 mg Oral BID  . vitamin C  500 mg Oral Daily  . aspirin EC  81 mg Oral Daily  . azithromycin  500 mg Oral Daily  . cholecalciferol  2,000 Units Oral Daily  . citalopram  10 mg Oral Daily  . dextromethorphan-guaiFENesin  1 tablet Oral BID  . famotidine  20 mg Oral BID  . furosemide  40 mg Oral Daily  . guaiFENesin  600 mg Oral BID  . methylPREDNISolone (SOLU-MEDROL) injection  1 mg/kg Intravenous Q12H   Followed by  . [START ON 02/04/2020] predniSONE  50 mg Oral Daily  . montelukast  10 mg Oral QHS  . pantoprazole  40 mg Oral Daily  . spironolactone  25 mg Oral Daily  . vitamin B-12  1,000 mcg Oral Daily  . zinc sulfate  220 mg Oral Daily   Continuous Infusions: . cefTRIAXone (ROCEPHIN)  IV Stopped (02/02/20 [id number])  . remdesivir 200 mg in sodium chloride 0.9% 250 mL IVPB       LOS: 2 days    Time spent: 40 minutes    Sharen Hones, MD [facility]   To contact the attending provider between 7A-7P or the covering provider during after hours 7P-7A, please log into the web site [email or web address] and access using universal [place]  password for that web site. If you do not have the password, please call the hospital operator.  02/02/2020, 1:44 PM

## 2020-02-02 NOTE — ED Notes (Signed)
Patient's son Nakayla Rorabaugh, patients HCPoA updated on patient. Patient brought patient specific phone to speak with son.  Phone number for [place] is [phone]

## 2020-02-02 NOTE — Progress Notes (Signed)
[facility] Cardiology    SUBJECTIVE: Patient still dyspneic hypoxic denies any pain has supplemental oxygen in place no fever chills or sweats still feels poorly   Vitals:   02/02/20 0600 02/02/20 0630 02/02/20 0700 02/02/20 0841  BP: 120/73 123/74 136/73   Pulse: (!) 59 64 61   Resp: (!) 24 20 (!) 21   Temp:      TempSrc:      SpO2: 97% 96% 94% 93%  Weight:      Height:         Intake/Output Summary (Last 24 hours) at 02/02/2020 1054 Last data filed at 02/02/2020 0554 Gross per 24 hour  Intake 240 ml  Output 1900 ml  Net -1660 ml      PHYSICAL EXAM  General: Well developed, well nourished, in no acute distress HEENT:  Normocephalic and atramatic Neck:  No JVD.  Lungs: Diffuse rhonchi bilaterally to auscultation and percussion. Heart: HRRR . Normal S1 and S2 without gallops or murmurs.  Abdomen: Bowel sounds are positive, abdomen soft and non-tender  Msk:  Back normal, normal gait. Normal strength and tone for age. Extremities: No clubbing, cyanosis or edema.   Neuro: Alert and oriented X 3. Psych:  Good affect, responds appropriately   LABS: Basic Metabolic Panel: Recent Labs    02/02/2020 1727 02/01/20 0608 02/02/20 0309  NA 125* 128* 128*  K 3.9 3.9 4.7  CL 89* 95* 93*  CO2 23 21* 24  GLUCOSE 132* 162* 113*  BUN 42* 39* 47*  CREATININE 1.69* 1.50* 1.45*  CALCIUM 8.7* 9.0 9.2  MG 1.4*  --   --    Liver Function Tests: Recent Labs    02/01/20 0608 02/02/20 0309  AST 46* 46*  ALT 19 21  ALKPHOS 57 60  BILITOT 0.6 0.7  PROT 6.7 6.9  ALBUMIN 3.1* 3.4*   No results for input(s): LIPASE, AMYLASE in the last 72 hours. CBC: Recent Labs    02/01/20 0608 02/02/20 0309  WBC 2.1* 2.9*  NEUTROABS 1.8 2.2  HGB 12.5 12.6  HCT 34.9* 36.6  MCV 91.4 94.3  PLT 199 238   Cardiac Enzymes: No results for input(s): CKTOTAL, CKMB, CKMBINDEX, TROPONINI in the last 72 hours. BNP: Invalid input(s): POCBNP D-Dimer: No results for input(s): DDIMER in the last 72  hours. Hemoglobin A1C: No results for input(s): HGBA1C in the last 72 hours. Fasting Lipid Panel: No results for input(s): CHOL, HDL, LDLCALC, TRIG, CHOLHDL, LDLDIRECT in the last 72 hours. Thyroid Function Tests: No results for input(s): TSH, T4TOTAL, T3FREE, THYROIDAB in the last 72 hours.  Invalid input(s): FREET3 Anemia Panel: Recent Labs    02/02/20 0309  FERRITIN 464*    DG Chest Portable 1 View  Result Date: 02/03/2020 CLINICAL DATA:  Shortness of breath EXAM: PORTABLE CHEST 1 VIEW COMPARISON:  12/15/2010 FINDINGS: Single lead left-sided implanted cardiac device. Stable cardiomegaly. Atherosclerotic calcification of the aortic knob. Mild diffuse interstitial prominence bilaterally. No focal airspace consolidation. No pleural effusion or pneumothorax. Severe degenerative changes of the bilateral shoulders. Multilevel thoracic spondylosis. IMPRESSION: Cardiomegaly with mild diffuse interstitial prominence bilaterally, which may reflect mild edema. Atypical/viral infectious process not excluded. Electronically Signed   By: Davina Poke D.O.   On: 02/26/2020 17:49     Echo pending  TELEMETRY: Atrial fibrillation rate control nonspecific findings  ASSESSMENT AND PLAN:  Active Problems:   Acute hypoxemic respiratory failure due to COVID-19 [facility]) Pneumonia on chest x-ray Non-STEMI elevated troponin Hypoxemia  Hyponatremia Chronic atrial fibrillation  Hypertension Hypomagnesemia . Plan Agree with supplemental oxygen therapy Continue aggressive therapy for Covid when detemir steroids Correct electrolytes Resume Eliquis for anticoagulation Continue current therapy for atrial fibrillation No direct cardiac therapy except medically We will continue conservative cardiac input      Alwyn Pea, MD 02/02/2020 10:54 AM

## 2020-02-02 NOTE — ED Notes (Signed)
This tech repeatedly going into pts room to answer call bell. Pt had taken high flow nasal canula (HFNC) off and SpO2 was 88% when this tech entered room each time. HFNC placed back on pt every time and SpO2 increased to 100% every time. RN aware. Will continue to monitor.

## 2020-02-02 NOTE — ED Notes (Signed)
Bladder scan revealed 953 mL of urine in bladder. In and out cath performed per MD order. Output of 950 mL obtained. Post cath bladder scan revealed 20 mL in bladder. Pt stating she is feeling relief at this time.

## 2020-02-02 NOTE — ED Notes (Signed)
Fourth trop discontinued, verbal order from Cliffton Asters, NP. Troponin is trending down.

## 2020-02-03 ENCOUNTER — Inpatient Hospital Stay
Admit: 2020-02-03 | Discharge: 2020-02-03 | Disposition: A | Discharge status: Home / Self Care | Payer: PPO | Attending: Internal Medicine | Admitting: Internal Medicine

## 2020-02-03 DIAGNOSIS — J9601 Acute respiratory failure with hypoxia: Secondary | ICD-10-CM | POA: Diagnosis not present

## 2020-02-03 DIAGNOSIS — I214 Non-ST elevation (NSTEMI) myocardial infarction: Secondary | ICD-10-CM | POA: Diagnosis not present

## 2020-02-03 DIAGNOSIS — J1282 Pneumonia due to coronavirus disease 2019: Secondary | ICD-10-CM | POA: Diagnosis not present

## 2020-02-03 DIAGNOSIS — U071 COVID-19: Secondary | ICD-10-CM | POA: Diagnosis not present

## 2020-02-03 LAB — CBC WITH DIFFERENTIAL/PLATELET
Abs Immature Granulocytes: 0.03 10*3/uL (ref 0.00–0.07)
Basophils Absolute: 0 10*3/uL (ref 0.0–0.1)
Basophils Relative: 0 %
Eosinophils Absolute: 0 10*3/uL (ref 0.0–0.5)
Eosinophils Relative: 0 %
HCT: 33 % — ABNORMAL LOW (ref 36.0–46.0)
Hemoglobin: 12 g/dL (ref 12.0–15.0)
Immature Granulocytes: 0 %
Lymphocytes Relative: 6 %
Lymphs Abs: 0.4 10*3/uL — ABNORMAL LOW (ref 0.7–4.0)
MCH: 33.2 pg (ref 26.0–34.0)
MCHC: 36.4 g/dL — ABNORMAL HIGH (ref 30.0–36.0)
MCV: 91.4 fL (ref 80.0–100.0)
Monocytes Absolute: 0.4 10*3/uL (ref 0.1–1.0)
Monocytes Relative: 6 %
Neutro Abs: 6.3 10*3/uL (ref 1.7–7.7)
Neutrophils Relative %: 88 %
Platelets: 231 10*3/uL (ref 150–400)
RBC: 3.61 MIL/uL — ABNORMAL LOW (ref 3.87–5.11)
RDW: 13.1 % (ref 11.5–15.5)
WBC: 7.1 10*3/uL (ref 4.0–10.5)
nRBC: 0 % (ref 0.0–0.2)

## 2020-02-03 LAB — COMPREHENSIVE METABOLIC PANEL
ALT: 20 U/L (ref 0–44)
AST: 41 U/L (ref 15–41)
Albumin: 3 g/dL — ABNORMAL LOW (ref 3.5–5.0)
Alkaline Phosphatase: 49 U/L (ref 38–126)
Anion gap: 11 (ref 5–15)
BUN: 49 mg/dL — ABNORMAL HIGH (ref 8–23)
CO2: 22 mmol/L (ref 22–32)
Calcium: 9.4 mg/dL (ref 8.9–10.3)
Chloride: 99 mmol/L (ref 98–111)
Creatinine, Ser: 1.27 mg/dL — ABNORMAL HIGH (ref 0.44–1.00)
GFR, Estimated: 39 mL/min — ABNORMAL LOW (ref >60)
Glucose, Bld: 113 mg/dL — ABNORMAL HIGH (ref 70–99)
Potassium: 4.5 mmol/L (ref 3.5–5.1)
Sodium: 132 mmol/L — ABNORMAL LOW (ref 135–145)
Total Bilirubin: 0.7 mg/dL (ref 0.3–1.2)
Total Protein: 6.4 g/dL — ABNORMAL LOW (ref 6.5–8.1)

## 2020-02-03 LAB — PROCALCITONIN: Procalcitonin: 0.1 ng/mL

## 2020-02-03 LAB — ECHOCARDIOGRAM COMPLETE
AR max vel: 2.1 cm2
AV Area VTI: 1.87 cm2
AV Area mean vel: 1.94 cm2
AV Mean grad: 5 mmHg
AV Peak grad: 9.1 mmHg
Ao pk vel: 1.51 m/s
Area-P 1/2: 4.44 cm2
Height: 60 in
P 1/2 time: 569 msec
S' Lateral: 2.9 cm
Weight: 2640 oz

## 2020-02-03 LAB — FERRITIN: Ferritin: 424 ng/mL — ABNORMAL HIGH (ref 11–307)

## 2020-02-03 LAB — C-REACTIVE PROTEIN: CRP: 2.7 mg/dL — ABNORMAL HIGH (ref <1.0)

## 2020-02-03 LAB — MAGNESIUM: Magnesium: 1.9 mg/dL (ref 1.7–2.4)

## 2020-02-03 LAB — FIBRIN DERIVATIVES D-DIMER (ARMC ONLY): Fibrin derivatives D-dimer (ARMC): 1515.28 ng/mL (FEU) — ABNORMAL HIGH (ref 0.00–499.00)

## 2020-02-03 MED ORDER — RIVAROXABAN 15 MG PO TABS
15.0000 mg | ORAL_TABLET | Freq: Every day | ORAL | Status: DC
  Administered 2020-02-03 – 2020-02-06 (×4): 15 mg via ORAL
  Filled 2020-02-03 (×4): qty 1

## 2020-02-03 NOTE — Progress Notes (Signed)
[facility] Room 116 [employer] [facility]) Hospital Liaison RN note:  This patient is currently followed by out patient based palliative care with [employer]. We will follow for disposition and update the team. Robbie Lis, TOC made aware via secure chat.   Thank you.  Cyndra Numbers, RN [facility] Liaison [phone]

## 2020-02-03 NOTE — Progress Notes (Signed)
*  PRELIMINARY RESULTS* Echocardiogram 2D Echocardiogram has been performed.  Cynthia Terry 02/03/2020, 9:17 AM

## 2020-02-03 NOTE — Progress Notes (Signed)
PROGRESS NOTE    Cynthia Terry  [id number] DOB: [birth year] DOA: 02/25/2020 PCP: Valera Castle, MD   Chief complaint.  Shortness of breath. Brief Narrative:  [name] [phone] y.o.Caucasian femalewith a known history of chronicatrial fibrillation and hypertension, presented to the emergency room with acute onset of worsening dyspnea with associateddry cough and occasional wheezing over the last couple weeks. She admitted to fatigue and tiredness and has not had much appetite. She has been feeling sleepy. Upon arriving the emergency room, she was tested positive for Covid. She developed severe hypoxemia, she required 50% oxygen. EKG also showed atrial fibrillation. Chest x-ray showed interstitial changes. She also had elevation of procalcitonin level at 0.25, lactic acidosis.  She is treated withsteroids.   Assessment & Plan:   Active Problems:   COPD (chronic obstructive pulmonary disease) (HCC)   Acute hypoxemic respiratory failure due to COVID-19 [facility])   NSTEMI (non-ST elevated myocardial infarction) ([place])   Pneumonia due to COVID-19 virus  #1. Acute hypoxemic respiratory failure secondary to COVID-19 pneumonia. Multifocal pneumonia secondary to COVID-19 infection. Patient currently on steroids. Family has refused to remdesivir.  Baricitinib was contraindicated due to renal function. Patient was also given antibiotics for procalcitonin level up to 0.25, it has dropped down to less than 0.1, antibiotics will be discontinued.  Patient probably does not have bacterial pneumonia. Patient condition appears to be better today, oxygenation is better, she is currently on 12 L oxygen.  2.  Chronic atrial fibrillation. Patient was on home Xarelto.  Change to Eliquis due to renal function.  May be able to change back at the time of discharge if renal function improves.  3.  Hyponatremia, chronic kidney disease stage IIIb. Stable, continue to follow.  4.   Non-STEMI. Patient had a significant elevation of troponin at time of admission.  Has been seen by cardiology, no intervention.  Echocardiogram still pending up to today's date.      DVT prophylaxis: Eliquis Code Status: DNR Family Communication: Son updated Disposition Plan:  .   Status is: Inpatient  Remains inpatient appropriate because:Inpatient level of care appropriate due to severity of illness   Dispo: The patient is from: Home              Anticipated d/c is to: SNF              Anticipated d/c date is: > 3 days              Patient currently is not medically stable to d/c.        I/O last 3 completed shifts: In: 240 [P.O.:240] Out: 2800 [Urine:2800] Total I/O In: -  Out: 500 [Urine:500]     Consultants:   None  Procedures: None  Antimicrobials:None  Subjective: Patient doing better today.  Currently on 12 L oxygen, improved from 50% heated high flow. She has some confusion, weakness. No abdominal pain or nausea vomiting.  No diarrhea. No fever or chills.  Objective: Vitals:   02/03/20 0057 02/03/20 0513 02/03/20 0910 02/03/20 0935  BP: 129/63 (!) 142/61  (!) 156/83  Pulse: 67 65  62  Resp: 20 16  18   Temp: (!) 97.5 F (36.4 C) 97.7 F (36.5 C)  98.2 F (36.8 C)  TempSrc:      SpO2: 92% 92% 91% 90%  Weight:      Height:        Intake/Output Summary (Last 24 hours) at 02/03/2020 1046 Last data filed at 02/03/2020 0900 Gross  per 24 hour  Intake -  Output 1400 ml  Net -1400 ml   Filed Weights   02/03/2020 1724  Weight: 74.8 kg    Examination:  General exam: Appears calm and comfortable  Respiratory system: Clear to auscultation. Respiratory effort normal. Cardiovascular system: S1 & S2 heard, RRR. No JVD, murmurs, rubs, gallops or clicks. No pedal edema. Gastrointestinal system: Abdomen is nondistended, soft and nontender. No organomegaly or masses felt. Normal bowel sounds heard. Central nervous system: Alert and oriented x2. No  focal neurological deficits. Extremities: Symmetric  Skin: No rashes, lesions or ulcers Psychiatry: Judgement and insight appear normal. Mood & affect appropriate.     Data Reviewed: I have personally reviewed following labs and imaging studies  CBC: Recent Labs  Lab 02/18/2020 1730 02/01/20 0608 02/02/20 0309 02/03/20 0436  WBC 4.9 2.1* 2.9* 7.1  NEUTROABS 3.6 1.8 2.2 6.3  HGB 11.7* 12.5 12.6 12.0  HCT 33.9* 34.9* 36.6 33.0*  MCV 94.7 91.4 94.3 91.4  PLT 206 199 238 [id number]   Basic Metabolic Panel: Recent Labs  Lab 02/27/2020 1727 02/01/20 0608 02/02/20 0309 02/03/20 0436  NA 125* 128* 128* 132*  K 3.9 3.9 4.7 4.5  CL 89* 95* 93* 99  CO2 23 21* 24 22  GLUCOSE 132* 162* 113* 113*  BUN 42* 39* 47* 49*  CREATININE 1.69* 1.50* 1.45* 1.27*  CALCIUM 8.7* 9.0 9.2 9.4  MG 1.4*  --   --  1.9   GFR: Estimated Creatinine Clearance: 25 mL/min (A) (by C-G formula based on SCr of 1.27 mg/dL (H)). Liver Function Tests: Recent Labs  Lab 02/06/2020 1727 02/01/20 0608 02/02/20 0309 02/03/20 0436  AST 37 46* 46* 41  ALT 17 19 21 20   ALKPHOS 52 57 60 49  BILITOT 0.7 0.6 0.7 0.7  PROT 6.6 6.7 6.9 6.4*  ALBUMIN 3.3* 3.1* 3.4* 3.0*   No results for input(s): LIPASE, AMYLASE in the last 168 hours. No results for input(s): AMMONIA in the last 168 hours. Coagulation Profile: No results for input(s): INR, PROTIME in the last 168 hours. Cardiac Enzymes: No results for input(s): CKTOTAL, CKMB, CKMBINDEX, TROPONINI in the last 168 hours. BNP (last 3 results) No results for input(s): PROBNP in the last 8760 hours. HbA1C: No results for input(s): HGBA1C in the last 72 hours. CBG: Recent Labs  Lab 02/02/20 2256  GLUCAP 113*   Lipid Profile: No results for input(s): CHOL, HDL, LDLCALC, TRIG, CHOLHDL, LDLDIRECT in the last 72 hours. Thyroid Function Tests: No results for input(s): TSH, T4TOTAL, FREET4, T3FREE, THYROIDAB in the last 72 hours. Anemia Panel: Recent Labs     02/02/20 0309 02/03/20 0436  FERRITIN 464* 424*   Sepsis Labs: Recent Labs  Lab 02/29/2020 1727 02/25/2020 1730 02/02/20 0309 02/03/20 0436  PROCALCITON 0.25  --  0.25 0.10  LATICACIDVEN  --  1.7  --   --     No results found for this or any previous visit (from the past 240 hour(s)).       Radiology Studies: No results found.      Scheduled Meds: . allopurinol  100 mg Oral Daily  . ammonium lactate   Topical BID  . vitamin C  500 mg Oral Daily  . aspirin EC  81 mg Oral Daily  . azithromycin  500 mg Oral Daily  . cholecalciferol  2,000 Units Oral Daily  . citalopram  10 mg Oral Daily  . dextromethorphan-guaiFENesin  1 tablet Oral BID  . famotidine  20  mg Oral BID  . furosemide  40 mg Oral Daily  . guaiFENesin  600 mg Oral BID  . methylPREDNISolone (SOLU-MEDROL) injection  1 mg/kg Intravenous Q12H   Followed by  . [START ON 02/04/2020] predniSONE  50 mg Oral Daily  . montelukast  10 mg Oral QHS  . pantoprazole  40 mg Oral Daily  . Rivaroxaban  15 mg Oral Q supper  . spironolactone  25 mg Oral Daily  . vitamin B-12  1,000 mcg Oral Daily  . zinc sulfate  220 mg Oral Daily   Continuous Infusions: . cefTRIAXone (ROCEPHIN)  IV 1 g (02/03/20 0900)  . remdesivir 200 mg in sodium chloride 0.9% 250 mL IVPB       LOS: 3 days    Time spent: 28 minutes    Marrion Coy, MD [facility]   To contact the attending provider between 7A-7P or the covering provider during after hours 7P-7A, please log into the web site [email or web address] and access using universal [place] password for that web site. If you do not have the password, please call the hospital operator.  02/03/2020, 10:46 AM

## 2020-02-04 DIAGNOSIS — U071 COVID-19: Secondary | ICD-10-CM | POA: Diagnosis not present

## 2020-02-04 DIAGNOSIS — Z515 Encounter for palliative care: Secondary | ICD-10-CM | POA: Diagnosis not present

## 2020-02-04 DIAGNOSIS — I509 Heart failure, unspecified: Secondary | ICD-10-CM

## 2020-02-04 DIAGNOSIS — J9601 Acute respiratory failure with hypoxia: Secondary | ICD-10-CM | POA: Diagnosis not present

## 2020-02-04 DIAGNOSIS — J449 Chronic obstructive pulmonary disease, unspecified: Secondary | ICD-10-CM | POA: Diagnosis not present

## 2020-02-04 DIAGNOSIS — Z7189 Other specified counseling: Secondary | ICD-10-CM

## 2020-02-04 LAB — COMPREHENSIVE METABOLIC PANEL
ALT: 23 U/L (ref 0–44)
AST: 35 U/L (ref 15–41)
Albumin: 3 g/dL — ABNORMAL LOW (ref 3.5–5.0)
Alkaline Phosphatase: 52 U/L (ref 38–126)
Anion gap: 12 (ref 5–15)
BUN: 47 mg/dL — ABNORMAL HIGH (ref 8–23)
CO2: 22 mmol/L (ref 22–32)
Calcium: 9.5 mg/dL (ref 8.9–10.3)
Chloride: 98 mmol/L (ref 98–111)
Creatinine, Ser: 1.44 mg/dL — ABNORMAL HIGH (ref 0.44–1.00)
GFR, Estimated: 34 mL/min — ABNORMAL LOW (ref >60)
Glucose, Bld: 122 mg/dL — ABNORMAL HIGH (ref 70–99)
Potassium: 4.5 mmol/L (ref 3.5–5.1)
Sodium: 132 mmol/L — ABNORMAL LOW (ref 135–145)
Total Bilirubin: 0.9 mg/dL (ref 0.3–1.2)
Total Protein: 6.3 g/dL — ABNORMAL LOW (ref 6.5–8.1)

## 2020-02-04 LAB — CBC WITH DIFFERENTIAL/PLATELET
Abs Immature Granulocytes: 0.02 10*3/uL (ref 0.00–0.07)
Basophils Absolute: 0 10*3/uL (ref 0.0–0.1)
Basophils Relative: 0 %
Eosinophils Absolute: 0 10*3/uL (ref 0.0–0.5)
Eosinophils Relative: 0 %
HCT: 33.8 % — ABNORMAL LOW (ref 36.0–46.0)
Hemoglobin: 11.9 g/dL — ABNORMAL LOW (ref 12.0–15.0)
Immature Granulocytes: 0 %
Lymphocytes Relative: 4 %
Lymphs Abs: 0.3 10*3/uL — ABNORMAL LOW (ref 0.7–4.0)
MCH: 32.2 pg (ref 26.0–34.0)
MCHC: 35.2 g/dL (ref 30.0–36.0)
MCV: 91.6 fL (ref 80.0–100.0)
Monocytes Absolute: 0.4 10*3/uL (ref 0.1–1.0)
Monocytes Relative: 5 %
Neutro Abs: 6.5 10*3/uL (ref 1.7–7.7)
Neutrophils Relative %: 91 %
Platelets: 232 10*3/uL (ref 150–400)
RBC: 3.69 MIL/uL — ABNORMAL LOW (ref 3.87–5.11)
RDW: 13.2 % (ref 11.5–15.5)
WBC: 7.1 10*3/uL (ref 4.0–10.5)
nRBC: 0 % (ref 0.0–0.2)

## 2020-02-04 LAB — FERRITIN: Ferritin: 411 ng/mL — ABNORMAL HIGH (ref 11–307)

## 2020-02-04 LAB — D-DIMER, QUANTITATIVE: D-Dimer, Quant: 0.88 ug/mL-FEU — ABNORMAL HIGH (ref 0.00–0.50)

## 2020-02-04 LAB — C-REACTIVE PROTEIN: CRP: 2.1 mg/dL — ABNORMAL HIGH (ref <1.0)

## 2020-02-04 MED ORDER — BARICITINIB 2 MG PO TABS
2.0000 mg | ORAL_TABLET | Freq: Every day | ORAL | Status: DC
  Administered 2020-02-05 – 2020-02-06 (×2): 2 mg via ORAL
  Filled 2020-02-04 (×2): qty 1

## 2020-02-04 MED ORDER — ALBUTEROL SULFATE HFA 108 (90 BASE) MCG/ACT IN AERS
1.0000 | INHALATION_SPRAY | RESPIRATORY_TRACT | Status: DC | PRN
  Administered 2020-02-04: 1 via RESPIRATORY_TRACT
  Filled 2020-02-04 (×2): qty 6.7

## 2020-02-04 MED ORDER — METHYLPREDNISOLONE SODIUM SUCC 40 MG IJ SOLR
40.0000 mg | Freq: Two times a day (BID) | INTRAMUSCULAR | Status: DC
  Administered 2020-02-04 – 2020-02-06 (×5): 40 mg via INTRAVENOUS
  Filled 2020-02-04 (×5): qty 1

## 2020-02-04 NOTE — Consult Note (Cosign Needed)
Consultation Note Date: 02/04/2020   Patient Name: Cynthia Terry  DOB: 04-17-1924  MRN: [id number]  Age / Sex: 87 y.o., female  PCP: Valera Castle, MD Referring Physician: Caren Griffins, MD  Reason for Consultation: Establishing goals of care  HPI/Patient Profile: 87 y.o. female  with past medical history of chronic atrial fibrillation on Xarelto, hypertension, CKD stage 3b admitted on 02/27/2020 with worsening dyspnea and dry cough with wheezing over past couple weeks accompanied with fatigue and poor appetite. Admitted with COVID and multifocal pneumonia with NSTEMI and hyponatremia.   Clinical Assessment and Goals of Care: I met today with Ms. Cynthia Terry today. No family at bedside. She is alert and oriented but very fatigued. She has NRB and no respiratory distress. She denies pain or trouble breathing. She showed me some photos of her family that were at bedside. She tells me that she hopes that God will continue to take care of her. She is hopeful that she can have some improvement and more time with her family. However, she also expresses acceptance and peace if God decides that it is her time to go. She denies any fears or concerns about end of life or current illness. She shares that she has had a good and long life and a loving family. I offered her reassurance that we will take good care of her and I will keep her in my thoughts and prayers.   I called and spoke with son, Cynthia Terry, who reports to be HCPOA. I shared with Cynthia Terry my above conversation with his mother. [name] reports that he is not surprised but is glad that she was able to express peace and acceptance. Cynthia Terry is clear that he does not want to lose his mother but he also understands that she is very ill and weak. He recognizes that this illness may lead to end of life. He confirms her wishes for DNR and no aggressive/invasive measures. They would  like to continue with current level of care at this time. He does express that they cannot care for her at home and worries about the next steps. I expressed to him that we will assist with recommendations and guidance for where to go from the hospital but this depends on how she does. He understands that we will discuss disposition further when closer to time for discharge.   All questions/concerns addressed. Emotional support provided.   Primary Decision Maker HCPOA son [name]    SUMMARY OF RECOMMENDATIONS   - DNR - Hopeful for improvement - continue current interventions for now - Wishes to avoid aggressive/invasive care  Code Status/Advance Care Planning:  DNR   Symptom Management:   Per attending  Palliative Prophylaxis:   Aspiration, Bowel Regimen, Delirium Protocol, Frequent Pain Assessment, Oral Care and Turn Reposition  Psycho-social/Spiritual:   Desire for further Chaplaincy support:yes  Additional Recommendations: Caregiving  Support/Resources and Education on Hospice  Prognosis:   Overall poor with frailty, severe illness, advanced age.   Discharge Planning: To Be Determined  Primary Diagnoses: Present on Admission: . Acute hypoxemic respiratory failure due to COVID-19 ([place]) . COPD (chronic obstructive pulmonary disease) ([place])   I have reviewed the medical record, interviewed the patient and family, and examined the patient. The following aspects are pertinent.  Past Medical History:  Diagnosis Date  . Atrial fibrillation ([place])   . Hypertension    Social History   Socioeconomic History  . Marital status: Widowed    Spouse name: Not on file  . Number of children: Not on file  . Years of education: Not on file  . Highest education level: Not on file  Occupational History  . Not on file  Tobacco Use  . Smoking status: Never Smoker  . Smokeless tobacco: Never Used  Substance and Sexual Activity  . Alcohol use: No    Alcohol/week: 0.0  standard drinks  . Drug use: No  . Sexual activity: Not on file  Other Topics Concern  . Not on file  Social History Narrative  . Not on file   Social Determinants of Health   Financial Resource Strain: Not on file  Food Insecurity: Not on file  Transportation Needs: Not on file  Physical Activity: Not on file  Stress: Not on file  Social Connections: Not on file   Family History  Problem Relation Age of Onset  . Heart disease Mother   . Renal cancer Father   . Heart disease Brother    Scheduled Meds: . allopurinol  100 mg Oral Daily  . ammonium lactate   Topical BID  . vitamin C  500 mg Oral Daily  . aspirin EC  81 mg Oral Daily  . baricitinib  2 mg Oral Daily  . cholecalciferol  2,000 Units Oral Daily  . citalopram  10 mg Oral Daily  . dextromethorphan-guaiFENesin  1 tablet Oral BID  . famotidine  20 mg Oral BID  . furosemide  40 mg Oral Daily  . guaiFENesin  600 mg Oral BID  . methylPREDNISolone (SOLU-MEDROL) injection  40 mg Intravenous Q12H  . montelukast  10 mg Oral QHS  . pantoprazole  40 mg Oral Daily  . Rivaroxaban  15 mg Oral Q supper  . vitamin B-12  1,000 mcg Oral Daily  . zinc sulfate  220 mg Oral Daily   Continuous Infusions: PRN Meds:.acetaminophen, chlorpheniramine-HYDROcodone, guaiFENesin-dextromethorphan, Ipratropium-Albuterol, magnesium hydroxide, ondansetron **OR** ondansetron (ZOFRAN) IV, traZODone Allergies  Allergen Reactions  . Amlodipine     swelling  . Clonidine Derivatives     dizziness   Review of Systems  Constitutional: Positive for activity change, appetite change and fatigue.  Respiratory: Negative for shortness of breath.   Neurological: Positive for weakness.    Physical Exam Constitutional:      General: She is not in acute distress.    Appearance: She is ill-appearing.  Cardiovascular:     Rate and Rhythm: Normal rate.  Pulmonary:     Effort: No tachypnea, accessory muscle usage or respiratory distress.     Comments:  NRB Lips cyanotic Abdominal:     General: Abdomen is flat.  Neurological:     Mental Status: She is alert and oriented to person, place, and time.     Vital Signs: BP (!) 159/92 (BP Location: Left Arm)   Pulse 76   Temp 98.1 F (36.7 C)   Resp 20   Ht 5' (1.524 m)   Wt 74.8 kg   SpO2 91%   BMI 32.22 kg/m  Pain Scale: 0-10  Pain Score: 0-No pain   SpO2: SpO2: 91 % O2 Device:SpO2: 91 % O2 Flow Rate: .O2 Flow Rate (L/min): 15 L/min  IO: Intake/output summary: No intake or output data in the 24 hours ending 02/04/20 1335  LBM: Last BM Date:  (pt not sure) Baseline Weight: Weight: 74.8 kg Most recent weight: Weight: 74.8 kg     Palliative Assessment/Data:     Time In: 1455 Time Out: 1545 Time Total: 50 min Greater than 50%  of this time was spent counseling and coordinating care related to the above assessment and plan.  Signed by: Vinie Sill, NP Palliative Medicine Team Pager # [phone] (M-F 8a-5p) Team Phone # [phone] (Nights/Weekends)

## 2020-02-04 NOTE — Progress Notes (Signed)
PROGRESS NOTE  Cynthia Terry [id number] DOB: 08/15/1924 DOA: 02/24/2020 PCP: Valera Castle, MD   LOS: 4 days   Brief Narrative / Interim history: [name] [phone] y.o.Caucasian femalewith a known history of chronicatrial fibrillation and hypertension, presented to the emergency room with acute onset of worsening dyspnea with associateddry cough and occasional wheezing over the last couple weeks. She admitted to fatigue and tiredness and has not had much appetite. She has been feeling sleepy. Upon arriving the emergency room, she was tested positive for Covid. She was found to be hypoxic and was admitted to the hospital   Subjective / 24h Interval events: Complains of shortness of breath  Assessment & Plan:  Principal Problem Acute Hypoxic Respiratory Failure due to Covid-19 Viral Illness -Patient with multifocal pneumonia secondary to COVID-19 infection.  Per prior hospitalist, patient and family refused Remdesivir she was started on steroids. -patient consented to Baricitinib by admitting MD, continue at a lower dose given renal impairment.  -She is on 15 L this morning, very poor prognosis at this point.  Consult palliative care   COVID-19 Labs  Recent Labs    02/02/20 0309 02/03/20 0436 02/04/20 0452  FERRITIN 464* 424* 411*  CRP 4.4* 2.7*  --     No results found for: SARSCOV2NAA  Active Problems Chronic A. fib -Continue home Xarelto  Chronic kidney disease stage IIIb -Creatinine appears at baseline, hold spironolactone today  Non-STEMI -Seen by cardiology, significant elevation of troponin at the time of admission.  Cardiology did not recommend any acute interventions.  Continue aspirin, Xarelto -2D echo with normal EF, no WMA  Hyponatremia -Slightly better today, sodium 132.  Continue to monitor.    Scheduled Meds: . allopurinol  100 mg Oral Daily  . ammonium lactate   Topical BID  . vitamin C  500 mg Oral Daily  . aspirin EC  81 mg Oral  Daily  . cholecalciferol  2,000 Units Oral Daily  . citalopram  10 mg Oral Daily  . dextromethorphan-guaiFENesin  1 tablet Oral BID  . famotidine  20 mg Oral BID  . furosemide  40 mg Oral Daily  . guaiFENesin  600 mg Oral BID  . montelukast  10 mg Oral QHS  . pantoprazole  40 mg Oral Daily  . predniSONE  50 mg Oral Daily  . Rivaroxaban  15 mg Oral Q supper  . spironolactone  25 mg Oral Daily  . vitamin B-12  1,000 mcg Oral Daily  . zinc sulfate  220 mg Oral Daily   Continuous Infusions: PRN Meds:.acetaminophen, chlorpheniramine-HYDROcodone, guaiFENesin-dextromethorphan, Ipratropium-Albuterol, magnesium hydroxide, ondansetron **OR** ondansetron (ZOFRAN) IV, traZODone  DVT prophylaxis: Xarelto Code Status: DNR Family Communication: daughter at bedside, son over the phone    Status is: Inpatient  Remains inpatient appropriate because:Inpatient level of care appropriate due to severity of illness   Dispo: The patient is from: Home              Anticipated d/c is to: Home              Anticipated d/c date is: > 3 days              Patient currently is not medically stable to d/c.  Consultants:  Cardiology   Procedures:  2D echo:  1. Left ventricular ejection fraction, by estimation, is 55 to 60%. The left ventricle has normal function. The left ventricle has no regional wall motion abnormalities. Left ventricular diastolic parameters were normal.  2. Right ventricular  systolic function is normal. The right ventricular size is normal.  3. Left atrial size was moderately dilated.  4. Right atrial size was moderately dilated.  5. The mitral valve is normal in structure. Moderate mitral valve regurgitation.  6. Tricuspid valve regurgitation is moderate.  7. The aortic valve is normal in structure. Aortic valve regurgitation is mild.   Microbiology: None   Antibacterials: Ceftriaxone / Azithromycin 1/3-1/4   Objective: Vitals:   02/04/20 0041 02/04/20 0359 02/04/20  0739 02/04/20 0747  BP: 129/67 (!) 166/71  (!) 163/79  Pulse:  70  92  Resp:  20  18  Temp:  98.5 F (36.9 C)  98.2 F (36.8 C)  TempSrc:      SpO2:  (!) 88% 91% (!) 88%  Weight:      Height:       No intake or output data in the 24 hours ending 02/04/20 1010 Filed Weights   02/04/2020 1724  Weight: 74.8 kg    Examination:  Constitutional: NAD Eyes: no scleral icterus ENMT: Mucous membranes are moist.  Neck: normal, supple Respiratory: Increased respiratory effort, no wheezing heard.  Tachypneic Cardiovascular: Regular rate and rhythm, no murmurs / rubs / gallops. No LE edema.  Abdomen: non distended, no tenderness. Bowel sounds positive.  Musculoskeletal: no clubbing / cyanosis.  Skin: no rashes Neurologic: No focal deficits  Data Reviewed: I have independently reviewed following labs and imaging studies   CBC: Recent Labs  Lab 02/22/2020 1730 02/01/20 0608 02/02/20 0309 02/03/20 0436 02/04/20 0452  WBC 4.9 2.1* 2.9* 7.1 7.1  NEUTROABS 3.6 1.8 2.2 6.3 6.5  HGB 11.7* 12.5 12.6 12.0 11.9*  HCT 33.9* 34.9* 36.6 33.0* 33.8*  MCV 94.7 91.4 94.3 91.4 91.6  PLT 206 199 238 231 [id number]   Basic Metabolic Panel: Recent Labs  Lab 02/11/2020 1727 02/01/20 0608 02/02/20 0309 02/03/20 0436 02/04/20 0452  NA 125* 128* 128* 132* 132*  K 3.9 3.9 4.7 4.5 4.5  CL 89* 95* 93* 99 98  CO2 23 21* 24 22 22   GLUCOSE 132* 162* 113* 113* 122*  BUN 42* 39* 47* 49* 47*  CREATININE 1.69* 1.50* 1.45* 1.27* 1.44*  CALCIUM 8.7* 9.0 9.2 9.4 9.5  MG 1.4*  --   --  1.9  --    GFR: Estimated Creatinine Clearance: 22 mL/min (A) (by C-G formula based on SCr of 1.44 mg/dL (H)). Liver Function Tests: Recent Labs  Lab 02/03/2020 1727 02/01/20 0608 02/02/20 0309 02/03/20 0436 02/04/20 0452  AST 37 46* 46* 41 35  ALT 17 19 21 20 23   ALKPHOS 52 57 60 49 52  BILITOT 0.7 0.6 0.7 0.7 0.9  PROT 6.6 6.7 6.9 6.4* 6.3*  ALBUMIN 3.3* 3.1* 3.4* 3.0* 3.0*   No results for input(s): LIPASE, AMYLASE  in the last 168 hours. No results for input(s): AMMONIA in the last 168 hours. Coagulation Profile: No results for input(s): INR, PROTIME in the last 168 hours. Cardiac Enzymes: No results for input(s): CKTOTAL, CKMB, CKMBINDEX, TROPONINI in the last 168 hours. BNP (last 3 results) No results for input(s): PROBNP in the last 8760 hours. HbA1C: No results for input(s): HGBA1C in the last 72 hours. CBG: Recent Labs  Lab 02/02/20 2256  GLUCAP 113*   Lipid Profile: No results for input(s): CHOL, HDL, LDLCALC, TRIG, CHOLHDL, LDLDIRECT in the last 72 hours. Thyroid Function Tests: No results for input(s): TSH, T4TOTAL, FREET4, T3FREE, THYROIDAB in the last 72 hours. Anemia Panel: Recent Labs  02/03/20 0436 02/04/20 0452  FERRITIN 424* 411*   Urine analysis:    Component Value Date/Time   COLORURINE Yellow 03/08/2013 1210   APPEARANCEUR Clear 03/08/2013 1210   LABSPEC 1.017 03/08/2013 1210   PHURINE 5.0 03/08/2013 1210   GLUCOSEU Negative 03/08/2013 1210   HGBUR 1+ 03/08/2013 1210   BILIRUBINUR Negative 03/08/2013 1210   KETONESUR Negative 03/08/2013 1210   PROTEINUR 30 mg/dL 56/31/4970 2637   NITRITE Negative 03/08/2013 1210   LEUKOCYTESUR Negative 03/08/2013 1210   Sepsis Labs: Invalid input(s): PROCALCITONIN, LACTICIDVEN  No results found for this or any previous visit (from the past 240 hour(s)).    Radiology Studies: ECHOCARDIOGRAM COMPLETE  Result Date: 02/03/2020    ECHOCARDIOGRAM REPORT   Patient Name:   AZELYN BATIE Date of Exam: 02/03/2020 Medical Rec #:  [id number]     Height:       60.0 in Accession #:    [id number]    Weight:       165.0 lb Date of Birth:  1924/12/20     BSA:          1.720 m Patient Age:    87 years      BP:           142/61 mmHg Patient Gender: F             HR:           81 bpm. Exam Location:  [facility] Procedure: 2D Echo, Color Doppler and Cardiac Doppler Indications:     I21.4 NSTEMI  History:         Patient has no prior history of  Echocardiogram examinations.                  COPD. Pt tested positive for COVID-19 on 01/31/19.  Sonographer:     Humphrey Rolls RDCS (AE) Referring Phys:  [id number] Marrion Coy Diagnosing Phys: Arnoldo Hooker MD  Sonographer Comments: Suboptimal subcostal window. IMPRESSIONS  1. Left ventricular ejection fraction, by estimation, is 55 to 60%. The left ventricle has normal function. The left ventricle has no regional wall motion abnormalities. Left ventricular diastolic parameters were normal.  2. Right ventricular systolic function is normal. The right ventricular size is normal.  3. Left atrial size was moderately dilated.  4. Right atrial size was moderately dilated.  5. The mitral valve is normal in structure. Moderate mitral valve regurgitation.  6. Tricuspid valve regurgitation is moderate.  7. The aortic valve is normal in structure. Aortic valve regurgitation is mild. FINDINGS  Left Ventricle: Left ventricular ejection fraction, by estimation, is 55 to 60%. The left ventricle has normal function. The left ventricle has no regional wall motion abnormalities. The left ventricular internal cavity size was normal in size. There is  no left ventricular hypertrophy. Left ventricular diastolic parameters were normal. Right Ventricle: The right ventricular size is normal. No increase in right ventricular wall thickness. Right ventricular systolic function is normal. Left Atrium: Left atrial size was moderately dilated. Right Atrium: Right atrial size was moderately dilated. Pericardium: There is no evidence of pericardial effusion. Mitral Valve: The mitral valve is normal in structure. Moderate mitral valve regurgitation. MV peak gradient, 7.3 mmHg. The mean mitral valve gradient is 3.0 mmHg. Tricuspid Valve: The tricuspid valve is normal in structure. Tricuspid valve regurgitation is moderate. Aortic Valve: The aortic valve is normal in structure. Aortic valve regurgitation is mild. Aortic regurgitation PHT measures 569  msec. Aortic valve mean gradient measures 5.0 mmHg.  Aortic valve peak gradient measures 9.1 mmHg. Aortic valve area, by VTI measures 1.87 cm. Pulmonic Valve: The pulmonic valve was normal in structure. Pulmonic valve regurgitation is not visualized. Aorta: The aortic root and ascending aorta are structurally normal, with no evidence of dilitation. IAS/Shunts: No atrial level shunt detected by color flow Doppler.  LEFT VENTRICLE PLAX 2D LVIDd:         4.20 cm  Diastology LVIDs:         2.90 cm  LV e' medial:    8.38 cm/s LV PW:         1.20 cm  LV E/e' medial:  13.5 LV IVS:        1.20 cm  LV e' lateral:   7.51 cm/s LVOT diam:     2.20 cm  LV E/e' lateral: 15.1 LV SV:         59 LV SV Index:   34 LVOT Area:     3.80 cm  RIGHT VENTRICLE RV Basal diam:  3.90 cm LEFT ATRIUM            Index       RIGHT ATRIUM           Index LA Vol (A4C): 154.0 ml 89.53 ml/m RA Area:     21.40 cm                                    RA Volume:   52.30 ml  30.41 ml/m  AORTIC VALVE                    PULMONIC VALVE AV Area (Vmax):    2.10 cm     PV Vmax:       1.07 m/s AV Area (Vmean):   1.94 cm     PV Vmean:      67.100 cm/s AV Area (VTI):     1.87 cm     PV VTI:        0.172 m AV Vmax:           151.00 cm/s  PV Peak grad:  4.6 mmHg AV Vmean:          104.000 cm/s PV Mean grad:  2.0 mmHg AV VTI:            0.317 m AV Peak Grad:      9.1 mmHg AV Mean Grad:      5.0 mmHg LVOT Vmax:         83.60 cm/s LVOT Vmean:        53.000 cm/s LVOT VTI:          0.156 m LVOT/AV VTI ratio: 0.49 AI PHT:            569 msec  AORTA Ao Root diam: 3.20 cm MITRAL VALVE                TRICUSPID VALVE MV Area (PHT): 4.44 cm     TR Peak grad:   40.7 mmHg MV Peak grad:  7.3 mmHg     TR Vmax:        319.00 cm/s MV Mean grad:  3.0 mmHg MV Vmax:       1.35 m/s     SHUNTS MV Vmean:      80.2 cm/s    Systemic VTI:  0.16 m MV Decel Time: 171 msec     Systemic  Diam: 2.20 cm MV E velocity: 113.50 cm/s Serafina Royals MD Electronically signed by Serafina Royals MD  Signature Date/Time: 02/03/2020/4:55:04 PM    Final      Marzetta Board, MD, PhD [facility]  Between 7 am - 7 pm I am available, please contact me via Amion or [place]  Between 7 pm - 7 am I am not available, please contact night coverage MD/APP via Amion

## 2020-02-04 NOTE — Progress Notes (Signed)
Pt resting at this time. Oxygen saturation is 96% on 15 NRB mask and 3 % HF. Dr Para March at the bedside to evaluate pt. Will continue to monitor.

## 2020-02-04 NOTE — Progress Notes (Signed)
Pt seen, struggling to breathe, tachypneic, crackles heard on auscultation. Oxygen saturation is 87% on NRB. Pt placed on  10 HF in addition to NRB and oxygen saturation increased between 90-92%. RT at the bedside to evaluate pt. MD on call notified. RN requested for IV Morphine to lessen the work of breathing. Dr Para March talked to son. Per Dr Para March, son is not ready for Morphine yet.

## 2020-02-05 DIAGNOSIS — Z7189 Other specified counseling: Secondary | ICD-10-CM | POA: Diagnosis not present

## 2020-02-05 DIAGNOSIS — J449 Chronic obstructive pulmonary disease, unspecified: Secondary | ICD-10-CM | POA: Diagnosis not present

## 2020-02-05 DIAGNOSIS — J9601 Acute respiratory failure with hypoxia: Secondary | ICD-10-CM | POA: Diagnosis not present

## 2020-02-05 DIAGNOSIS — Z515 Encounter for palliative care: Secondary | ICD-10-CM | POA: Diagnosis not present

## 2020-02-05 DIAGNOSIS — I509 Heart failure, unspecified: Secondary | ICD-10-CM | POA: Diagnosis not present

## 2020-02-05 DIAGNOSIS — U071 COVID-19: Secondary | ICD-10-CM | POA: Diagnosis not present

## 2020-02-05 LAB — D-DIMER, QUANTITATIVE: D-Dimer, Quant: 1.07 ug/mL-FEU — ABNORMAL HIGH (ref 0.00–0.50)

## 2020-02-05 LAB — COMPREHENSIVE METABOLIC PANEL
ALT: 22 U/L (ref 0–44)
AST: 28 U/L (ref 15–41)
Albumin: 3 g/dL — ABNORMAL LOW (ref 3.5–5.0)
Alkaline Phosphatase: 49 U/L (ref 38–126)
Anion gap: 11 (ref 5–15)
BUN: 48 mg/dL — ABNORMAL HIGH (ref 8–23)
CO2: 26 mmol/L (ref 22–32)
Calcium: 9.7 mg/dL (ref 8.9–10.3)
Chloride: 96 mmol/L — ABNORMAL LOW (ref 98–111)
Creatinine, Ser: 1.41 mg/dL — ABNORMAL HIGH (ref 0.44–1.00)
GFR, Estimated: 35 mL/min — ABNORMAL LOW (ref >60)
Glucose, Bld: 131 mg/dL — ABNORMAL HIGH (ref 70–99)
Potassium: 4.5 mmol/L (ref 3.5–5.1)
Sodium: 133 mmol/L — ABNORMAL LOW (ref 135–145)
Total Bilirubin: 0.8 mg/dL (ref 0.3–1.2)
Total Protein: 6.4 g/dL — ABNORMAL LOW (ref 6.5–8.1)

## 2020-02-05 LAB — CBC WITH DIFFERENTIAL/PLATELET
Abs Immature Granulocytes: 0.2 10*3/uL — ABNORMAL HIGH (ref 0.00–0.07)
Basophils Absolute: 0 10*3/uL (ref 0.0–0.1)
Basophils Relative: 0 %
Eosinophils Absolute: 0 10*3/uL (ref 0.0–0.5)
Eosinophils Relative: 0 %
HCT: 34.6 % — ABNORMAL LOW (ref 36.0–46.0)
Hemoglobin: 12.1 g/dL (ref 12.0–15.0)
Immature Granulocytes: 2 %
Lymphocytes Relative: 3 %
Lymphs Abs: 0.3 10*3/uL — ABNORMAL LOW (ref 0.7–4.0)
MCH: 32.6 pg (ref 26.0–34.0)
MCHC: 35 g/dL (ref 30.0–36.0)
MCV: 93.3 fL (ref 80.0–100.0)
Monocytes Absolute: 0.4 10*3/uL (ref 0.1–1.0)
Monocytes Relative: 4 %
Neutro Abs: 10.2 10*3/uL — ABNORMAL HIGH (ref 1.7–7.7)
Neutrophils Relative %: 91 %
Platelets: 224 10*3/uL (ref 150–400)
RBC: 3.71 MIL/uL — ABNORMAL LOW (ref 3.87–5.11)
RDW: 13.2 % (ref 11.5–15.5)
WBC: 11.1 10*3/uL — ABNORMAL HIGH (ref 4.0–10.5)
nRBC: 0 % (ref 0.0–0.2)

## 2020-02-05 LAB — FERRITIN: Ferritin: 457 ng/mL — ABNORMAL HIGH (ref 11–307)

## 2020-02-05 LAB — C-REACTIVE PROTEIN: CRP: 1.8 mg/dL — ABNORMAL HIGH (ref <1.0)

## 2020-02-05 MED ORDER — MORPHINE SULFATE (PF) 2 MG/ML IV SOLN: 1.0000 mg | INTRAVENOUS | Status: DC | PRN

## 2020-02-05 NOTE — Progress Notes (Signed)
PROGRESS NOTE  Cynthia Terry YFV:[id number] DOB: October 06, 1924 DOA: 02/26/2020 PCP: Dione Housekeeper, MD   LOS: 5 days   Brief Narrative / Interim history: Cynthia Terry [phone] y.o.Caucasian femalewith a known history of chronicatrial fibrillation and hypertension, presented to the emergency room with acute onset of worsening dyspnea with associateddry cough and occasional wheezing over the last couple weeks. She admitted to fatigue and tiredness and has not had much appetite. She has been feeling sleepy. Upon arriving the emergency room, she was tested positive for Covid. She was found to be hypoxic and was admitted to the hospital   Subjective / 24h Interval events: States that shortness of breath is little better  Assessment & Plan:  Principal Problem Acute Hypoxic Respiratory Failure due to Covid-19 Viral Illness -Patient with multifocal pneumonia secondary to COVID-19 infection.  Per prior hospitalist, patient and family refused Remdesivir she was started on steroids. -patient consented to Baricitinib by admitting MD, continue at a lower dose given renal impairment.  -Very guarded prognosis -Palliative care discussions ongoing, for now continue current care and avoid escalation, including heated high flow   COVID-19 Labs  Recent Labs    02/03/20 0436 02/04/20 0452 02/05/20 0606  DDIMER  --  0.88* 1.07*  FERRITIN 424* 411* 457*  CRP 2.7* 2.1* 1.8*    No results found for: SARSCOV2NAA  Active Problems Chronic A. fib -Continue home Xarelto  Chronic kidney disease stage IIIb -Creatinine stable, recheck tomorrow morning  Non-STEMI -Seen by cardiology, significant elevation of troponin at the time of admission.  Cardiology did not recommend any acute interventions.  Continue aspirin, Xarelto -2D echo with normal EF, no WMA  Hyponatremia -Sodium stable   Scheduled Meds: . allopurinol  100 mg Oral Daily  . ammonium lactate   Topical BID  . vitamin C  500 mg  Oral Daily  . aspirin EC  81 mg Oral Daily  . baricitinib  2 mg Oral Daily  . cholecalciferol  2,000 Units Oral Daily  . citalopram  10 mg Oral Daily  . dextromethorphan-guaiFENesin  1 tablet Oral BID  . famotidine  20 mg Oral BID  . furosemide  40 mg Oral Daily  . guaiFENesin  600 mg Oral BID  . methylPREDNISolone (SOLU-MEDROL) injection  40 mg Intravenous Q12H  . montelukast  10 mg Oral QHS  . pantoprazole  40 mg Oral Daily  . Rivaroxaban  15 mg Oral Q supper  . vitamin B-12  1,000 mcg Oral Daily  . zinc sulfate  220 mg Oral Daily   Continuous Infusions: PRN Meds:.acetaminophen, albuterol, chlorpheniramine-HYDROcodone, guaiFENesin-dextromethorphan, Ipratropium-Albuterol, magnesium hydroxide, ondansetron **OR** ondansetron (ZOFRAN) IV, traZODone  DVT prophylaxis: Xarelto Code Status: DNR Family Communication: daughter at bedside   Status is: Inpatient  Remains inpatient appropriate because:Inpatient level of care appropriate due to severity of illness   Dispo: The patient is from: Home              Anticipated d/c is to: Home              Anticipated d/c date is: > 3 days              Patient currently is not medically stable to d/c.  Consultants:  Cardiology   Procedures:  2D echo:  1. Left ventricular ejection fraction, by estimation, is 55 to 60%. The left ventricle has normal function. The left ventricle has no regional wall motion abnormalities. Left ventricular diastolic parameters were normal.  2. Right ventricular systolic function  is normal. The right ventricular size is normal.  3. Left atrial size was moderately dilated.  4. Right atrial size was moderately dilated.  5. The mitral valve is normal in structure. Moderate mitral valve regurgitation.  6. Tricuspid valve regurgitation is moderate.  7. The aortic valve is normal in structure. Aortic valve regurgitation is mild.   Microbiology: None   Antibacterials: Ceftriaxone / Azithromycin 1/3-1/4    Objective: Vitals:   02/04/20 2339 02/05/20 0341 02/05/20 0757 02/05/20 0810  BP: 124/78 (!) 146/72  (!) 164/79  Pulse: 80 74  80  Resp: 20 19  20   Temp: 98.4 F (36.9 C) 97.8 F (36.6 C)  98 F (36.7 C)  TempSrc: Axillary     SpO2: 95% 93% 90% (!) 89%  Weight:      Height:        Intake/Output Summary (Last 24 hours) at 02/05/2020 1409 Last data filed at 02/04/2020 2000 Gross per 24 hour  Intake 30 ml  Output --  Net 30 ml   Filed Weights   02/13/2020 1724  Weight: 74.8 kg    Examination:  Constitutional: Appears tachypneic and tired Eyes: no icterus  ENMT: mmm Neck: normal, supple Respiratory: increased respiratory effort, no wheezing, diminished at the bases Cardiovascular: rrr, no mrg, no edema Abdomen: soft, no tenderness, bs+ Musculoskeletal: no clubbing / cyanosis.  Skin: no rashes Neurologic: non focal   Data Reviewed: I have independently reviewed following labs and imaging studies   CBC: Recent Labs  Lab 02/01/20 0608 02/02/20 0309 02/03/20 0436 02/04/20 0452 02/05/20 0606  WBC 2.1* 2.9* 7.1 7.1 11.1*  NEUTROABS 1.8 2.2 6.3 6.5 10.2*  HGB 12.5 12.6 12.0 11.9* 12.1  HCT 34.9* 36.6 33.0* 33.8* 34.6*  MCV 91.4 94.3 91.4 91.6 93.3  PLT 199 238 231 232 [id number]   Basic Metabolic Panel: Recent Labs  Lab 02/24/2020 1727 02/01/20 0608 02/02/20 0309 02/03/20 0436 02/04/20 0452 02/05/20 0606  NA 125* 128* 128* 132* 132* 133*  K 3.9 3.9 4.7 4.5 4.5 4.5  CL 89* 95* 93* 99 98 96*  CO2 23 21* 24 22 22 26   GLUCOSE 132* 162* 113* 113* 122* 131*  BUN 42* 39* 47* 49* 47* 48*  CREATININE 1.69* 1.50* 1.45* 1.27* 1.44* 1.41*  CALCIUM 8.7* 9.0 9.2 9.4 9.5 9.7  MG 1.4*  --   --  1.9  --   --    GFR: Estimated Creatinine Clearance: 22.5 mL/min (A) (by C-G formula based on SCr of 1.41 mg/dL (H)). Liver Function Tests: Recent Labs  Lab 02/01/20 0608 02/02/20 0309 02/03/20 0436 02/04/20 0452 02/05/20 0606  AST 46* 46* 41 35 28  ALT 19 21 20 23 22    ALKPHOS 57 60 49 52 49  BILITOT 0.6 0.7 0.7 0.9 0.8  PROT 6.7 6.9 6.4* 6.3* 6.4*  ALBUMIN 3.1* 3.4* 3.0* 3.0* 3.0*   No results for input(s): LIPASE, AMYLASE in the last 168 hours. No results for input(s): AMMONIA in the last 168 hours. Coagulation Profile: No results for input(s): INR, PROTIME in the last 168 hours. Cardiac Enzymes: No results for input(s): CKTOTAL, CKMB, CKMBINDEX, TROPONINI in the last 168 hours. BNP (last 3 results) No results for input(s): PROBNP in the last 8760 hours. HbA1C: No results for input(s): HGBA1C in the last 72 hours. CBG: Recent Labs  Lab 02/02/20 2256  GLUCAP 113*   Lipid Profile: No results for input(s): CHOL, HDL, LDLCALC, TRIG, CHOLHDL, LDLDIRECT in the last 72 hours. Thyroid Function Tests:  No results for input(s): TSH, T4TOTAL, FREET4, T3FREE, THYROIDAB in the last 72 hours. Anemia Panel: Recent Labs    02/04/20 0452 02/05/20 0606  FERRITIN 411* 457*   Urine analysis:    Component Value Date/Time   COLORURINE Yellow 03/08/2013 1210   APPEARANCEUR Clear 03/08/2013 1210   LABSPEC 1.017 03/08/2013 1210   PHURINE 5.0 03/08/2013 1210   GLUCOSEU Negative 03/08/2013 1210   HGBUR 1+ 03/08/2013 1210   BILIRUBINUR Negative 03/08/2013 1210   KETONESUR Negative 03/08/2013 1210   PROTEINUR 30 mg/dL 03/08/2013 1210   NITRITE Negative 03/08/2013 1210   LEUKOCYTESUR Negative 03/08/2013 1210   Sepsis Labs: Invalid input(s): PROCALCITONIN, LACTICIDVEN  No results found for this or any previous visit (from the past 240 hour(s)).    Radiology Studies: No results found.   Marzetta Board, MD, PhD [facility]  Between 7 am - 7 pm I am available, please contact me via Amion or [place]  Between 7 pm - 7 am I am not available, please contact night coverage MD/APP via Amion

## 2020-02-05 NOTE — Progress Notes (Signed)
Palliative:  HPI: 87 y.o. female  with past medical history of chronic atrial fibrillation on Xarelto, hypertension, CKD stage 3b admitted on 22-Feb-2020 with worsening dyspnea and dry cough with wheezing over past couple weeks accompanied with fatigue and poor appetite. Admitted with COVID and multifocal pneumonia with NSTEMI and hyponatremia.   Ms. Butzin is lying in bed. She is requiring more oxygen today. Continues to be extremely frail and fatigued. Her son, Orvilla Fus, is at bedside. Ms. Considine tells me today that she wants to get better. She has never really been sick in her life except for 1-2 other times. She has always been the one to care for others: her husband (died from Parkinson's ~2 years ago) and her parents. She continues to tell me that she hopes God will give her more time.   I discussed with [name] at bedside more about concern for her prognosis. Orvilla Fus feels that she looks a little better today. We discussed her weakness and frailty and poor appetite. We discussed that she has increased and very high oxygen needs and without escalation of care she does not have much more we can give her. He confirms that they would not want her oxygen escalated to heated high flow, BiPAP, or ventilator as all these become a life support and prolong suffering. We discussed her hands that are cool to touch and lips that are dusky and cyanotic as signs of her body trying to force oxygen to her major organs and how depleted her body actually is.   I further discussed with Orvilla Fus that there is not much more that we can offer. I worry that she is unable to eat/drink really much of anything and that her body does not have the resources to improve. Neither of them are prepared for comfort care today but Orvilla Fus does confirm that he does not want her to suffer. I explained the use of morphine as a way to give her lungs and body relief when the oxygen is not enough and that this is often needed to prevent suffering. I explained  that she does not need this right now but I worry she could decline further as she did last night and there is not much more oxygen we can provide for her. He understands and agrees with use of morphine if absolutely needed as he does not want her to suffer but he does request to be called if she is requiring morphine. He wants to ensure that family is able to be with her if she progresses to actively dying stages. He does report that it is difficult for the rest of the family to understand how sick she is since they cannot see her.   All questions/concerns addressed. Emotional support provided.   Exam: Alert, oriented. Extremely fatigued and weak. HR 80s. NRB 15L + 10L nasal cannula. Hands cool to touch. Lips cyanotic. Tachypnea especially when speaking 25-30 and oxygen 88-90%. Breathing shallow but regular and without distress.   Plan: - Continue current level of care. Not ready for comfort care at this time. - No plans for escalation to heated HFNC, BiPAP, vent. No need for ICU.  - Call [name] if needing morphine.  - They are open to comfort care if she declines but will need to notify Orvilla Fus so that family can be with her.   35 min  Yong Channel, NP Palliative Medicine Team Pager [phone] (Please see [email or web address] for schedule) Team Phone [phone]    Greater than 50%  of this time  was spent counseling and coordinating care related to the above assessment and plan

## 2020-02-06 DIAGNOSIS — Z515 Encounter for palliative care: Secondary | ICD-10-CM | POA: Diagnosis not present

## 2020-02-06 DIAGNOSIS — I509 Heart failure, unspecified: Secondary | ICD-10-CM | POA: Diagnosis not present

## 2020-02-06 DIAGNOSIS — Z7189 Other specified counseling: Secondary | ICD-10-CM | POA: Diagnosis not present

## 2020-02-06 DIAGNOSIS — J9601 Acute respiratory failure with hypoxia: Secondary | ICD-10-CM | POA: Diagnosis not present

## 2020-02-06 DIAGNOSIS — J449 Chronic obstructive pulmonary disease, unspecified: Secondary | ICD-10-CM | POA: Diagnosis not present

## 2020-02-06 DIAGNOSIS — U071 COVID-19: Secondary | ICD-10-CM | POA: Diagnosis not present

## 2020-02-06 LAB — C-REACTIVE PROTEIN: CRP: 4.4 mg/dL — ABNORMAL HIGH (ref <1.0)

## 2020-02-06 LAB — CBC
HCT: 37.8 % (ref 36.0–46.0)
Hemoglobin: 12.8 g/dL (ref 12.0–15.0)
MCH: 31.8 pg (ref 26.0–34.0)
MCHC: 33.9 g/dL (ref 30.0–36.0)
MCV: 93.8 fL (ref 80.0–100.0)
Platelets: 258 10*3/uL (ref 150–400)
RBC: 4.03 MIL/uL (ref 3.87–5.11)
RDW: 13.1 % (ref 11.5–15.5)
WBC: 13.3 10*3/uL — ABNORMAL HIGH (ref 4.0–10.5)
nRBC: 0 % (ref 0.0–0.2)

## 2020-02-06 LAB — COMPREHENSIVE METABOLIC PANEL
ALT: 21 U/L (ref 0–44)
AST: 26 U/L (ref 15–41)
Albumin: 3.1 g/dL — ABNORMAL LOW (ref 3.5–5.0)
Alkaline Phosphatase: 54 U/L (ref 38–126)
Anion gap: 9 (ref 5–15)
BUN: 47 mg/dL — ABNORMAL HIGH (ref 8–23)
CO2: 28 mmol/L (ref 22–32)
Calcium: 9.7 mg/dL (ref 8.9–10.3)
Chloride: 98 mmol/L (ref 98–111)
Creatinine, Ser: 1.36 mg/dL — ABNORMAL HIGH (ref 0.44–1.00)
GFR, Estimated: 36 mL/min — ABNORMAL LOW (ref >60)
Glucose, Bld: 137 mg/dL — ABNORMAL HIGH (ref 70–99)
Potassium: 4.5 mmol/L (ref 3.5–5.1)
Sodium: 135 mmol/L (ref 135–145)
Total Bilirubin: 1 mg/dL (ref 0.3–1.2)
Total Protein: 6.7 g/dL (ref 6.5–8.1)

## 2020-02-06 LAB — D-DIMER, QUANTITATIVE: D-Dimer, Quant: 0.95 ug/mL-FEU — ABNORMAL HIGH (ref 0.00–0.50)

## 2020-02-06 LAB — BRAIN NATRIURETIC PEPTIDE: B Natriuretic Peptide: 211.2 pg/mL — ABNORMAL HIGH (ref 0.0–100.0)

## 2020-02-06 MED ORDER — LORAZEPAM 2 MG/ML IJ SOLN
1.0000 mg | INTRAMUSCULAR | Status: DC | PRN
  Administered 2020-02-08 (×2): 1 mg via INTRAVENOUS
  Filled 2020-02-06 (×2): qty 1

## 2020-02-06 MED ORDER — POLYVINYL ALCOHOL 1.4 % OP SOLN
1.0000 [drp] | Freq: Four times a day (QID) | OPHTHALMIC | Status: DC | PRN
  Filled 2020-02-06: qty 15

## 2020-02-06 MED ORDER — ONDANSETRON HCL 4 MG/2ML IJ SOLN: 4.0000 mg | Freq: Four times a day (QID) | INTRAMUSCULAR | Status: DC | PRN

## 2020-02-06 MED ORDER — GLYCOPYRROLATE 0.2 MG/ML IJ SOLN: 0.2000 mg | INTRAMUSCULAR | Status: DC | PRN

## 2020-02-06 MED ORDER — ACETAMINOPHEN 650 MG RE SUPP: 650.0000 mg | Freq: Four times a day (QID) | RECTAL | Status: DC | PRN

## 2020-02-06 MED ORDER — GLYCOPYRROLATE 1 MG PO TABS
1.0000 mg | ORAL_TABLET | ORAL | Status: DC | PRN
  Filled 2020-02-06: qty 1

## 2020-02-06 MED ORDER — BIOTENE DRY MOUTH MT LIQD: 15.0000 mL | OROMUCOSAL | Status: DC | PRN

## 2020-02-06 MED ORDER — LORAZEPAM 1 MG PO TABS: 1.0000 mg | ORAL_TABLET | ORAL | Status: DC | PRN

## 2020-02-06 MED ORDER — FUROSEMIDE 10 MG/ML IJ SOLN
40.0000 mg | Freq: Once | INTRAMUSCULAR | Status: AC
  Administered 2020-02-06: 09:00:00 40 mg via INTRAVENOUS
  Filled 2020-02-06: qty 4

## 2020-02-06 MED ORDER — FUROSEMIDE 40 MG PO TABS: 40.0000 mg | ORAL_TABLET | Freq: Every day | ORAL | Status: DC

## 2020-02-06 MED ORDER — ONDANSETRON 4 MG PO TBDP
4.0000 mg | ORAL_TABLET | Freq: Four times a day (QID) | ORAL | Status: DC | PRN
  Filled 2020-02-06: qty 1

## 2020-02-06 MED ORDER — HALOPERIDOL LACTATE 5 MG/ML IJ SOLN: 0.5000 mg | INTRAMUSCULAR | Status: DC | PRN

## 2020-02-06 MED ORDER — HALOPERIDOL 0.5 MG PO TABS
0.5000 mg | ORAL_TABLET | ORAL | Status: DC | PRN
  Filled 2020-02-06: qty 1

## 2020-02-06 MED ORDER — MORPHINE SULFATE (PF) 2 MG/ML IV SOLN
1.0000 mg | INTRAVENOUS | Status: DC | PRN
  Administered 2020-02-07 – 2020-02-08 (×6): 1 mg via INTRAVENOUS
  Filled 2020-02-06 (×6): qty 1

## 2020-02-06 MED ORDER — LORAZEPAM 2 MG/ML PO CONC
1.0000 mg | ORAL | Status: DC | PRN
  Administered 2020-02-07 (×2): 1 mg via SUBLINGUAL
  Filled 2020-02-06 (×2): qty 1

## 2020-02-06 MED ORDER — ACETAMINOPHEN 325 MG PO TABS: 650.0000 mg | ORAL_TABLET | Freq: Four times a day (QID) | ORAL | Status: DC | PRN

## 2020-02-06 MED ORDER — SENNA 8.6 MG PO TABS: 1.0000 | ORAL_TABLET | Freq: Every day | ORAL | Status: DC

## 2020-02-06 MED ORDER — ACETAMINOPHEN 500 MG PO TABS
1000.0000 mg | ORAL_TABLET | Freq: Three times a day (TID) | ORAL | Status: DC
  Administered 2020-02-06 (×2): 1000 mg via ORAL
  Filled 2020-02-06 (×2): qty 2

## 2020-02-06 MED ORDER — HALOPERIDOL LACTATE 2 MG/ML PO CONC
0.5000 mg | ORAL | Status: DC | PRN
  Filled 2020-02-06: qty 0.3

## 2020-02-06 NOTE — Progress Notes (Signed)
Palliative:  HPI: 87 y.o.femalewith past medical history of chronic atrial fibrillation on Xarelto, hypertension, CKD stage 3badmitted on 1/1/2022with worsening dyspnea and dry cough with wheezing over past couple weeks accompanied with fatigue and poor appetite.Admitted with COVID and multifocal pneumonia with NSTEMI and hyponatremia.  I met today at Cynthia Terry's bedside along with son, Cynthia Terry. Cynthia Terry does speak with [place] and is still oriented. She does complain of being more tired and needing to sleep. I encourage her to just rest her eyes and sleep. She continues to tell me that she just wants "to feel better" and "get stronger" but also expresses that she is so tired.  I discussed further with Cynthia Terry. Cynthia Terry is tearful. I explain that I do not feel she will improve past where she is right now. I think Cynthia Terry is waiting for Cynthia Terry's body to make the decision for him. Also the fact that she continues to be awake and oriented it is difficult to make decisions for comfort care. Cynthia Terry expresses that he knows she is suffering and he hates to see her suffer but he "doesn't want to give up on her" either. We discussed that it is her body that is failing her not him. We agree for comfort care with further decline as no escalation of care desired. He understands that she is on max oxygen at this time. We also agree that if she continues like this through the weekend without significant improvement that we will plan for more comfort focused care Monday.   All questions/concerns addressed. Emotional support provided. Discussed with Dr. Cruzita Lederer.   Exam: Alert, oriented. Extremely fatigued and weak. HR 80s. NRB 15L + 15L nasal cannula. Hands cool to touch. Lips cyanotic. Tachypnea especially when speaking 25-32 and oxygen 81-91%. Breathing shallow with more tachypnea today.   Plan: - On max oxygen with no plans for heated high flow, BiPAP, vent.  - Comfort care with further decline. Call to discuss with  Cynthia Terry and allow for family visitation.  - No escalation of care.    Dexter, NP Palliative Medicine Team Pager [phone] (Please see [email or web address] for schedule) Team Phone [phone]    Greater than 50%  of this time was spent counseling and coordinating care related to the above assessment and plan

## 2020-02-06 NOTE — Progress Notes (Signed)
PROGRESS NOTE  Cynthia Terry GXQ:[id number] DOB: [birth year] DOA: 02/09/2020 PCP: Valera Castle, MD   LOS: 6 days   Brief Narrative / Interim history: [name] [phone] y.o.Caucasian femalewith a known history of chronicatrial fibrillation and hypertension, presented to the emergency room with acute onset of worsening dyspnea with associateddry cough and occasional wheezing over the last couple weeks. She admitted to fatigue and tiredness and has not had much appetite. She has been feeling sleepy. Upon arriving the emergency room, she was tested positive for Covid. She was found to be hypoxic and was admitted to the hospital   Subjective / 24h Interval events: Appears very weak and little energy even to speak, struggling to breathe.  Mumbles words.  Alert  Assessment & Plan:  Principal Problem Acute Hypoxic Respiratory Failure due to Covid-19 Viral Illness -Patient with multifocal pneumonia secondary to COVID-19 infection.  Per prior hospitalist, patient and family refused Remdesivir she was started on steroids. -patient consented to Baricitinib by admitting MD, continue at a lower dose given renal impairment.  -She continues to decline, getting worse, guarded prognosis.  Palliative following, family is coming to terms that she may not survive this hospitalization.  No further escalation of care, no heated high flow, no ICU transfer, if she is to decline needs comfort measures.  Family will be contacted if that is the case.   COVID-19 Labs  Recent Labs    02/04/20 0452 02/05/20 0606 02/06/20 0341  DDIMER 0.88* 1.07*  --   FERRITIN 411* 457*  --   CRP 2.1* 1.8* 4.4*    No results found for: SARSCOV2NAA  Active Problems Chronic A. fib -Continue home medications  Chronic kidney disease stage IIIb -Creatinine overall stable  Non-STEMI -Seen by cardiology, significant elevation of troponin at the time of admission.  Cardiology did not recommend any acute  interventions.  Continue aspirin, Xarelto -2D echo with normal EF, no WMA  Hyponatremia -Sodium stable   Scheduled Meds: . acetaminophen  1,000 mg Oral TID  . allopurinol  100 mg Oral Daily  . ammonium lactate   Topical BID  . vitamin C  500 mg Oral Daily  . aspirin EC  81 mg Oral Daily  . baricitinib  2 mg Oral Daily  . cholecalciferol  2,000 Units Oral Daily  . citalopram  10 mg Oral Daily  . dextromethorphan-guaiFENesin  1 tablet Oral BID  . famotidine  20 mg Oral BID  . [START ON 02/07/2020] furosemide  40 mg Oral Daily  . guaiFENesin  600 mg Oral BID  . methylPREDNISolone (SOLU-MEDROL) injection  40 mg Intravenous Q12H  . montelukast  10 mg Oral QHS  . pantoprazole  40 mg Oral Daily  . Rivaroxaban  15 mg Oral Q supper  . vitamin B-12  1,000 mcg Oral Daily  . zinc sulfate  220 mg Oral Daily   Continuous Infusions: PRN Meds:.albuterol, chlorpheniramine-HYDROcodone, guaiFENesin-dextromethorphan, Ipratropium-Albuterol, magnesium hydroxide, morphine injection, ondansetron **OR** ondansetron (ZOFRAN) IV, traZODone  DVT prophylaxis: Xarelto Code Status: DNR Family Communication: daughter at bedside   Status is: Inpatient  Remains inpatient appropriate because:Inpatient level of care appropriate due to severity of illness   Dispo: The patient is from: Home              Anticipated d/c is to: Home              Anticipated d/c date is: > 3 days              Patient  currently is not medically stable to d/c.  Consultants:  Cardiology   Procedures:  2D echo:  1. Left ventricular ejection fraction, by estimation, is 55 to 60%. The left ventricle has normal function. The left ventricle has no regional wall motion abnormalities. Left ventricular diastolic parameters were normal.  2. Right ventricular systolic function is normal. The right ventricular size is normal.  3. Left atrial size was moderately dilated.  4. Right atrial size was moderately dilated.  5. The mitral  valve is normal in structure. Moderate mitral valve regurgitation.  6. Tricuspid valve regurgitation is moderate.  7. The aortic valve is normal in structure. Aortic valve regurgitation is mild.   Microbiology: None   Antibacterials: Ceftriaxone / Azithromycin 1/3-1/4   Objective: Vitals:   02/06/20 0445 02/06/20 0738 02/06/20 0746 02/06/20 1109  BP: (!) 143/83  (!) 143/80 (!) 148/97  Pulse: 74  78 77  Resp: 20  19 19   Temp: 98.5 F (36.9 C)  98.6 F (37 C) 97.8 F (36.6 C)  TempSrc: Oral     SpO2: (!) 88% 90% 91% 90%  Weight:      Height:        Intake/Output Summary (Last 24 hours) at 02/06/2020 1318 Last data filed at 02/06/2020 8841 Gross per 24 hour  Intake --  Output 1110 ml  Net -1110 ml   Filed Weights   02/20/2020 1724  Weight: 74.8 kg    Examination:  Constitutional: Tachypneic, tired, but alert Eyes: No scleral icterus ENMT: Moist mucous membranes Neck: normal, supple Respiratory: very shallow respirations, tachypneic, no wheezing Cardiovascular: Regular rate and rhythm, no murmurs Abdomen: Soft, nontender, nondistended, bowel sounds positive Musculoskeletal: no clubbing / cyanosis.  Skin: No rashes Neurologic: No focal deficits  Data Reviewed: I have independently reviewed following labs and imaging studies   CBC: Recent Labs  Lab 02/01/20 0608 02/02/20 0309 02/03/20 0436 02/04/20 0452 02/05/20 0606 02/06/20 0341  WBC 2.1* 2.9* 7.1 7.1 11.1* 13.3*  NEUTROABS 1.8 2.2 6.3 6.5 10.2*  --   HGB 12.5 12.6 12.0 11.9* 12.1 12.8  HCT 34.9* 36.6 33.0* 33.8* 34.6* 37.8  MCV 91.4 94.3 91.4 91.6 93.3 93.8  PLT 199 238 231 232 224 660   Basic Metabolic Panel: Recent Labs  Lab 02/27/2020 1727 02/01/20 0608 02/02/20 0309 02/03/20 0436 02/04/20 0452 02/05/20 0606 02/06/20 0341  NA 125*   < > 128* 132* 132* 133* 135  K 3.9   < > 4.7 4.5 4.5 4.5 4.5  CL 89*   < > 93* 99 98 96* 98  CO2 23   < > 24 22 22 26 28   GLUCOSE 132*   < > 113* 113* 122* 131*  137*  BUN 42*   < > 47* 49* 47* 48* 47*  CREATININE 1.69*   < > 1.45* 1.27* 1.44* 1.41* 1.36*  CALCIUM 8.7*   < > 9.2 9.4 9.5 9.7 9.7  MG 1.4*  --   --  1.9  --   --   --    < > = values in this interval not displayed.   GFR: Estimated Creatinine Clearance: 23.3 mL/min (A) (by C-G formula based on SCr of 1.36 mg/dL (H)). Liver Function Tests: Recent Labs  Lab 02/02/20 0309 02/03/20 0436 02/04/20 0452 02/05/20 0606 02/06/20 0341  AST 46* 41 35 28 26  ALT 21 20 23 22 21   ALKPHOS 60 49 52 49 54  BILITOT 0.7 0.7 0.9 0.8 1.0  PROT 6.9 6.4* 6.3*  6.4* 6.7  ALBUMIN 3.4* 3.0* 3.0* 3.0* 3.1*   No results for input(s): LIPASE, AMYLASE in the last 168 hours. No results for input(s): AMMONIA in the last 168 hours. Coagulation Profile: No results for input(s): INR, PROTIME in the last 168 hours. Cardiac Enzymes: No results for input(s): CKTOTAL, CKMB, CKMBINDEX, TROPONINI in the last 168 hours. BNP (last 3 results) No results for input(s): PROBNP in the last 8760 hours. HbA1C: No results for input(s): HGBA1C in the last 72 hours. CBG: Recent Labs  Lab 02/02/20 2256  GLUCAP 113*   Lipid Profile: No results for input(s): CHOL, HDL, LDLCALC, TRIG, CHOLHDL, LDLDIRECT in the last 72 hours. Thyroid Function Tests: No results for input(s): TSH, T4TOTAL, FREET4, T3FREE, THYROIDAB in the last 72 hours. Anemia Panel: Recent Labs    02/04/20 0452 02/05/20 0606  FERRITIN 411* 457*   Urine analysis:    Component Value Date/Time   COLORURINE Yellow 03/08/2013 1210   APPEARANCEUR Clear 03/08/2013 1210   LABSPEC 1.017 03/08/2013 1210   PHURINE 5.0 03/08/2013 1210   GLUCOSEU Negative 03/08/2013 1210   HGBUR 1+ 03/08/2013 1210   BILIRUBINUR Negative 03/08/2013 1210   KETONESUR Negative 03/08/2013 1210   PROTEINUR 30 mg/dL 03/08/2013 1210   NITRITE Negative 03/08/2013 1210   LEUKOCYTESUR Negative 03/08/2013 1210   Sepsis Labs: Invalid input(s): PROCALCITONIN, LACTICIDVEN  No  results found for this or any previous visit (from the past 240 hour(s)).    Radiology Studies: No results found.   Marzetta Board, MD, PhD [facility]  Between 7 am - 7 pm I am available, please contact me via Amion or Securechat  Between 7 pm - 7 am I am not available, please contact night coverage MD/APP via Amion

## 2020-02-06 NOTE — TOC Initial Note (Signed)
Transition of Care [facility]) - Initial/Assessment Note    Patient Details  Name: Cynthia Terry MRN: [id number] Date of Birth: 1924/07/09  [facility] CM/SW Contact:    Shelbie Hutching, RN Phone Number: 02/06/2020, 2:39 PM  Clinical Narrative:                 Patient admitted to the hospital with [place]. Patient is currently on 15L HFNC and non rebreather.  Family agrees to no escalation of care but does not yet want comfort care.  If patient continues to decline family will agree to comfort measures.  Palliative is following.    Expected Discharge Plan:  (may transition to comfort care) Barriers to Discharge: Continued Medical Work up   Patient Goals and CMS Choice Patient states their goals for this hospitalization and ongoing recovery are:: no escalation of care      Expected Discharge Plan and Services Expected Discharge Plan:  (may transition to comfort care)                                              Prior Living Arrangements/Services     Patient language and need for interpreter reviewed:: Yes        Need for Family Participation in Patient Care: Yes (Comment) Care giver support system in place?: Yes (comment) (daughter, son, grandchildren)   Criminal Activity/Legal Involvement Pertinent to Current Situation/Hospitalization: No - Comment as needed  Activities of Daily Living   ADL Screening (condition at time of admission) Patient's cognitive ability adequate to safely complete daily activities?: Yes Is the patient deaf or have difficulty hearing?: No Does the patient have difficulty seeing, even when wearing glasses/contacts?: No Does the patient have difficulty concentrating, remembering, or making decisions?: No Patient able to express need for assistance with ADLs?: Yes Does the patient have difficulty dressing or bathing?: Yes Independently performs ADLs?: No Is this a change from baseline?: Pre-admission baseline  Permission  Sought/Granted                  Emotional Assessment Appearance:: Appears stated age Attitude/Demeanor/Rapport: Lethargic   Orientation: : Oriented to Self,Oriented to Situation,Oriented to Place Alcohol / Substance Use: Not Applicable Psych Involvement: No (comment)  Admission diagnosis:  Acute respiratory failure with hypoxia ([place]) [J96.01] Chronic obstructive pulmonary disease, unspecified COPD type ([place]) [J44.9] Chronic congestive heart failure, unspecified heart failure type ([place]) [I50.9] Acute hypoxemic respiratory failure due to COVID-19 ([place]) [U07.1, J96.01] Pneumonia due to COVID-19 virus [U07.1, J12.82] Patient Active Problem List   Diagnosis Date Noted  . NSTEMI (non-ST elevated myocardial infarction) ([place]) 02/02/2020  . Pneumonia due to COVID-19 virus 02/02/2020  . Acute hypoxemic respiratory failure due to COVID-19 ([place]) Feb 21, 2020  . Moderate tricuspid insufficiency 07/18/2018  . SOBOE (shortness of breath on exertion) 04/11/2018  . Persistent atrial fibrillation ([place]) 04/12/2017  . Anxiety 02/27/2017  . Gastroesophageal reflux disease without esophagitis 02/27/2017  . COPD (chronic obstructive pulmonary disease) ([place]) 10/18/2016  . Acute idiopathic gout 09/18/2016  . Chronic pulmonary hypertension ([place]) 09/13/2015  . Vitamin D insufficiency 08/03/2011  . Arthritis 09/21/2010  . Polymyalgia rheumatica ([place]) 09/21/2010   PCP:  Valera Castle, MD Pharmacy:   [facility] [place], [address] AT [address] [place] [address]  Phone: [phone] Fax: [phone]     Social Determinants of Health (SDOH) Interventions    Readmission Risk Interventions No flowsheet data found.

## 2020-02-07 DIAGNOSIS — U071 COVID-19: Secondary | ICD-10-CM | POA: Diagnosis not present

## 2020-02-07 DIAGNOSIS — J9601 Acute respiratory failure with hypoxia: Secondary | ICD-10-CM | POA: Diagnosis not present

## 2020-02-07 NOTE — Progress Notes (Signed)
Pt present with extreme SOB at rest accompanied circumoral cyanosis. Oxygen saturation is 84% with 15l of high flow being delivered via nasal canula and NRB. Pt is repositioned in high fowlers and deep breathing exercising and performed without any effectiveness. Rapid response protocol is initiated. Per family request, no higher level of care is to be rendered. Son, [name] is contacted immediately to discuss the current situation and to inform him that comfort measures may be needed at this time. [name] request to visit his mom along with his sister before comfort measures begin. Provider is notified of the occurrences and family wishes. Orders are pending at this time.

## 2020-02-07 NOTE — Progress Notes (Signed)
PRN dose of ativan and morphine administered for apparent anxiety and dyspnea and complaints of pain.

## 2020-02-07 NOTE — Progress Notes (Signed)
PROGRESS NOTE  Cynthia Terry NFA:[id number] DOB: Jun 04, 1924 DOA: 02/29/2020 PCP: Valera Castle, MD   LOS: 7 days   Brief Narrative / Interim history: [name] [phone] y.o.Caucasian femalewith a known history of chronicatrial fibrillation and hypertension, presented to the emergency room with acute onset of worsening dyspnea with associateddry cough and occasional wheezing over the last couple weeks. She admitted to fatigue and tiredness and has not had much appetite. She has been feeling sleepy. Upon arriving the emergency room, she was tested positive for Covid. She was found to be hypoxic and was admitted to the hospital   Subjective / 24h Interval events: Seems more lethargic this morning, barely waking up.  Assessment & Plan:  Principal Problem Acute Hypoxic Respiratory Failure due to Covid-19 Viral Illness -Patient with multifocal pneumonia secondary to COVID-19 infection. Family refused Remdesivir she was started on steroids along with baricitinib -Despite treatment she continues to decline, remains on significant oxygen support.  Palliative care consulted, she is DNR, no further escalation of care.  Progressively getting worse, now transitioned to comfort measures.  Anticipating hospital death   COVID-19 Labs  Recent Labs    02/05/20 0606 02/06/20 0341  DDIMER 1.07* 0.95*  FERRITIN 457*  --   CRP 1.8* 4.4*    No results found for: SARSCOV2NAA  Active Problems Chronic A. fib -On comfort meds  Chronic kidney disease stage IIIb -No need to repeat labs  Non-STEMI -Seen by cardiology, significant elevation of troponin at the time of admission.  Cardiology did not recommend any acute interventions.  2D echo with normal EF, no WMA. On comfort meds  Hyponatremia -Sodium stable, no need to repeat labs   Scheduled Meds: . acetaminophen  1,000 mg Oral TID  . allopurinol  100 mg Oral Daily  . ammonium lactate   Topical BID  . famotidine  20 mg Oral BID   . furosemide  40 mg Oral Daily  . guaiFENesin  600 mg Oral BID  . vitamin B-12  1,000 mcg Oral Daily   Continuous Infusions: PRN Meds:.acetaminophen **OR** acetaminophen, albuterol, antiseptic oral rinse, chlorpheniramine-HYDROcodone, glycopyrrolate **OR** glycopyrrolate **OR** glycopyrrolate, guaiFENesin-dextromethorphan, haloperidol **OR** haloperidol **OR** haloperidol lactate, Ipratropium-Albuterol, LORazepam **OR** LORazepam **OR** LORazepam, magnesium hydroxide, morphine injection, ondansetron **OR** ondansetron (ZOFRAN) IV, polyvinyl alcohol  DVT prophylaxis: Xarelto Code Status: DNR Family Communication: No family at bedside this morning   Status is: Inpatient  Remains inpatient appropriate because:Inpatient level of care appropriate due to severity of illness   Dispo: The patient is from: Home              Anticipated d/c is to: Home              Anticipated d/c date is: > 3 days              Patient currently is not medically stable to d/c.  Consultants:  Cardiology   Procedures:  2D echo:  1. Left ventricular ejection fraction, by estimation, is 55 to 60%. The left ventricle has normal function. The left ventricle has no regional wall motion abnormalities. Left ventricular diastolic parameters were normal.  2. Right ventricular systolic function is normal. The right ventricular size is normal.  3. Left atrial size was moderately dilated.  4. Right atrial size was moderately dilated.  5. The mitral valve is normal in structure. Moderate mitral valve regurgitation.  6. Tricuspid valve regurgitation is moderate.  7. The aortic valve is normal in structure. Aortic valve regurgitation is mild.   Microbiology:  None   Antibacterials: Ceftriaxone / Azithromycin 1/3-1/4   Objective: Vitals:   02/06/20 1109 02/06/20 1500 02/06/20 1942 02/07/20 0800  BP: (!) 148/97 (!) 145/87 (!) 155/80 (!) 142/64  Pulse: 77 75 93 60  Resp: 19 18 (!) 26 14  Temp: 97.8 F (36.6  C) 97.9 F (36.6 C) 97.8 F (36.6 C) 97.7 F (36.5 C)  TempSrc:      SpO2: 90% 91% 93% 91%  Weight:      Height:        Intake/Output Summary (Last 24 hours) at 02/07/2020 1021 Last data filed at 02/07/2020 0618 Gross per 24 hour  Intake 236 ml  Output 1000 ml  Net -764 ml   Filed Weights   02/23/2020 1724  Weight: 74.8 kg    Examination:  Constitutional: Appears comfortable, shallow respirations, lethargic Respiratory: Shallow respirations, no wheezing Cardiovascular: Regular rate and rhythm, no murmurs  Data Reviewed: I have independently reviewed following labs and imaging studies   CBC: Recent Labs  Lab 02/01/20 0608 02/02/20 0309 02/03/20 0436 02/04/20 0452 02/05/20 0606 02/06/20 0341  WBC 2.1* 2.9* 7.1 7.1 11.1* 13.3*  NEUTROABS 1.8 2.2 6.3 6.5 10.2*  --   HGB 12.5 12.6 12.0 11.9* 12.1 12.8  HCT 34.9* 36.6 33.0* 33.8* 34.6* 37.8  MCV 91.4 94.3 91.4 91.6 93.3 93.8  PLT 199 238 231 232 224 0000000   Basic Metabolic Panel: Recent Labs  Lab 02/02/2020 1727 02/01/20 0608 02/02/20 0309 02/03/20 0436 02/04/20 0452 02/05/20 0606 02/06/20 0341  NA 125*   < > 128* 132* 132* 133* 135  K 3.9   < > 4.7 4.5 4.5 4.5 4.5  CL 89*   < > 93* 99 98 96* 98  CO2 23   < > 24 22 22 26 28   GLUCOSE 132*   < > 113* 113* 122* 131* 137*  BUN 42*   < > 47* 49* 47* 48* 47*  CREATININE 1.69*   < > 1.45* 1.27* 1.44* 1.41* 1.36*  CALCIUM 8.7*   < > 9.2 9.4 9.5 9.7 9.7  MG 1.4*  --   --  1.9  --   --   --    < > = values in this interval not displayed.   GFR: Estimated Creatinine Clearance: 23.3 mL/min (A) (by C-G formula based on SCr of 1.36 mg/dL (H)). Liver Function Tests: Recent Labs  Lab 02/02/20 0309 02/03/20 0436 02/04/20 0452 02/05/20 0606 02/06/20 0341  AST 46* 41 35 28 26  ALT 21 20 23 22 21   ALKPHOS 60 49 52 49 54  BILITOT 0.7 0.7 0.9 0.8 1.0  PROT 6.9 6.4* 6.3* 6.4* 6.7  ALBUMIN 3.4* 3.0* 3.0* 3.0* 3.1*   No results for input(s): LIPASE, AMYLASE in the last 168  hours. No results for input(s): AMMONIA in the last 168 hours. Coagulation Profile: No results for input(s): INR, PROTIME in the last 168 hours. Cardiac Enzymes: No results for input(s): CKTOTAL, CKMB, CKMBINDEX, TROPONINI in the last 168 hours. BNP (last 3 results) No results for input(s): PROBNP in the last 8760 hours. HbA1C: No results for input(s): HGBA1C in the last 72 hours. CBG: Recent Labs  Lab 02/02/20 2256  GLUCAP 113*   Lipid Profile: No results for input(s): CHOL, HDL, LDLCALC, TRIG, CHOLHDL, LDLDIRECT in the last 72 hours. Thyroid Function Tests: No results for input(s): TSH, T4TOTAL, FREET4, T3FREE, THYROIDAB in the last 72 hours. Anemia Panel: Recent Labs    02/05/20 0606  FERRITIN 457*  Urine analysis:    Component Value Date/Time   COLORURINE Yellow 03/08/2013 1210   APPEARANCEUR Clear 03/08/2013 1210   LABSPEC 1.017 03/08/2013 1210   PHURINE 5.0 03/08/2013 1210   GLUCOSEU Negative 03/08/2013 1210   HGBUR 1+ 03/08/2013 1210   BILIRUBINUR Negative 03/08/2013 1210   KETONESUR Negative 03/08/2013 1210   PROTEINUR 30 mg/dL 03/08/2013 1210   NITRITE Negative 03/08/2013 1210   LEUKOCYTESUR Negative 03/08/2013 1210   Sepsis Labs: Invalid input(s): PROCALCITONIN, LACTICIDVEN  No results found for this or any previous visit (from the past 240 hour(s)).    Radiology Studies: No results found.   Marzetta Board, MD, PhD [facility]  Between 7 am - 7 pm I am available, please contact me via Amion or Securechat  Between 7 pm - 7 am I am not available, please contact night coverage MD/APP via Amion

## 2020-02-07 NOTE — Progress Notes (Signed)
Family is at the bedside. Pt is alert and still present with labored breathing at rest. The daughter is at the bedside coaching with deep breathing exercises to assist her mother improve her oxygenation status. Patient remains in high fowlers with a oxygen saturation of 89% on 15l of high flow oxygen being duo delivered via nasal canula and NRB. Pt skin is pink and no signs of circumoral cyanosis is present.

## 2020-02-07 NOTE — Progress Notes (Signed)
Comfort care orders have been received. Family remains at bedside. Pt is in bed resting with her eyes closed. Breathing pattern is slight labored. No extreme distress is noted. Oxygen saturation is 88%

## 2020-02-07 NOTE — Progress Notes (Signed)
Patient transitioned to comfort care per family wishes due to progressive decline in her status. They would not wish to escalate further care at this time. Requesting that they be allowed to visit during this transition.    Cynthia Terry, BSN, MSN, DNP, [employer]  [facility] Nurse Practitioner  [facility]

## 2020-02-08 DIAGNOSIS — J9601 Acute respiratory failure with hypoxia: Secondary | ICD-10-CM | POA: Diagnosis not present

## 2020-02-08 DIAGNOSIS — U071 COVID-19: Secondary | ICD-10-CM | POA: Diagnosis not present

## 2020-02-08 MED ORDER — LORAZEPAM 2 MG/ML IJ SOLN
2.0000 mg | INTRAMUSCULAR | Status: DC | PRN
  Administered 2020-02-08 – 2020-02-09 (×2): 2 mg via INTRAVENOUS
  Filled 2020-02-08 (×2): qty 1

## 2020-02-08 MED ORDER — MORPHINE SULFATE (PF) 2 MG/ML IV SOLN
2.0000 mg | INTRAVENOUS | Status: DC | PRN
  Administered 2020-02-08 – 2020-02-09 (×2): 2 mg via INTRAVENOUS
  Filled 2020-02-08 (×2): qty 1

## 2020-02-08 MED ORDER — LORAZEPAM 1 MG PO TABS: 1.0000 mg | ORAL_TABLET | ORAL | Status: DC | PRN

## 2020-02-08 MED ORDER — LORAZEPAM 2 MG/ML PO CONC: 1.0000 mg | ORAL | Status: DC | PRN

## 2020-02-08 NOTE — Progress Notes (Signed)
PROGRESS NOTE  Cynthia Terry [id number] DOB: 18-Aug-1924 DOA: 02/11/2020 PCP: Valera Castle, MD   LOS: 8 days   Brief Narrative / Interim history: [name] [phone] y.o.Caucasian femalewith a known history of chronicatrial fibrillation and hypertension, presented to the emergency room with acute onset of worsening dyspnea with associateddry cough and occasional wheezing over the last couple weeks. She admitted to fatigue and tiredness and has not had much appetite. She has been feeling sleepy. Upon arriving the emergency room, she was tested positive for Covid. She was found to be hypoxic and was admitted to the hospital   Subjective / 24h Interval events: Lethargic this morning but appears comfortable  Assessment & Plan:  Principal Problem Acute Hypoxic Respiratory Failure due to Covid-19 Viral Illness -Patient with multifocal pneumonia secondary to COVID-19 infection. Family refused Remdesivir she was started on steroids along with baricitinib -Despite treatment she continues to decline, remains on significant oxygen support.  Palliative care consulted, she is DNR, no further escalation of care.  Progressively getting worse, now transitioned to comfort measures.  Anticipating hospital death   COVID-19 Labs  Recent Labs    02/06/20 0341  DDIMER 0.95*  CRP 4.4*    No results found for: SARSCOV2NAA  Active Problems Chronic A. fib -On comfort   Chronic kidney disease stage IIIb -No need to repeat labs  Non-STEMI -Seen by cardiology, significant elevation of troponin at the time of admission.  Cardiology did not recommend any acute interventions.  2D echo with normal EF, no WMA. On comfort   Hyponatremia -Sodium stable, no need to repeat labs   Scheduled Meds: . acetaminophen  1,000 mg Oral TID  . allopurinol  100 mg Oral Daily  . ammonium lactate   Topical BID  . famotidine  20 mg Oral BID  . furosemide  40 mg Oral Daily  . guaiFENesin  600 mg Oral  BID  . vitamin B-12  1,000 mcg Oral Daily   Continuous Infusions: PRN Meds:.acetaminophen **OR** acetaminophen, albuterol, antiseptic oral rinse, chlorpheniramine-HYDROcodone, glycopyrrolate **OR** glycopyrrolate **OR** glycopyrrolate, guaiFENesin-dextromethorphan, haloperidol **OR** haloperidol **OR** haloperidol lactate, Ipratropium-Albuterol, LORazepam **OR** LORazepam **OR** LORazepam, magnesium hydroxide, morphine injection, ondansetron **OR** ondansetron (ZOFRAN) IV, polyvinyl alcohol  DVT prophylaxis: Xarelto Code Status: DNR Family Communication: No family at bedside this morning   Status is: Inpatient  Remains inpatient appropriate because:Inpatient level of care appropriate due to severity of illness   Dispo: The patient is from: Home              Anticipated d/c is to: Home              Anticipated d/c date is: > 3 days              Patient currently is not medically stable to d/c.  Consultants:  Cardiology   Procedures:  2D echo:  1. Left ventricular ejection fraction, by estimation, is 55 to 60%. The left ventricle has normal function. The left ventricle has no regional wall motion abnormalities. Left ventricular diastolic parameters were normal.  2. Right ventricular systolic function is normal. The right ventricular size is normal.  3. Left atrial size was moderately dilated.  4. Right atrial size was moderately dilated.  5. The mitral valve is normal in structure. Moderate mitral valve regurgitation.  6. Tricuspid valve regurgitation is moderate.  7. The aortic valve is normal in structure. Aortic valve regurgitation is mild.   Microbiology: None   Antibacterials: Ceftriaxone / Azithromycin 1/3-1/4   Objective: Vitals:  02/06/20 1500 02/06/20 1942 02/07/20 0800 02/08/20 0032  BP: (!) 145/87 (!) 155/80 (!) 142/64 (!) 158/56  Pulse: 75 93 60 63  Resp: 18 (!) 26 14 20   Temp: 97.9 F (36.6 C) 97.8 F (36.6 C) 97.7 F (36.5 C) 97.7 F (36.5 C)   TempSrc:    Oral  SpO2: 91% 93% 91% (!) 85%  Weight:      Height:        Intake/Output Summary (Last 24 hours) at 02/08/2020 0931 Last data filed at 02/08/2020 4496 Gross per 24 hour  Intake --  Output 400 ml  Net -400 ml   Filed Weights   02/11/2020 1724  Weight: 74.8 kg    Examination:  Constitutional: Comfortable, shallow respirations Respiratory: No wheezing Cardiovascular: Heart regular  Data Reviewed: I have independently reviewed following labs and imaging studies   CBC: Recent Labs  Lab 02/02/20 0309 02/03/20 0436 02/04/20 0452 02/05/20 0606 02/06/20 0341  WBC 2.9* 7.1 7.1 11.1* 13.3*  NEUTROABS 2.2 6.3 6.5 10.2*  --   HGB 12.6 12.0 11.9* 12.1 12.8  HCT 36.6 33.0* 33.8* 34.6* 37.8  MCV 94.3 91.4 91.6 93.3 93.8  PLT 238 231 232 224 759   Basic Metabolic Panel: Recent Labs  Lab 02/02/20 0309 02/03/20 0436 02/04/20 0452 02/05/20 0606 02/06/20 0341  NA 128* 132* 132* 133* 135  K 4.7 4.5 4.5 4.5 4.5  CL 93* 99 98 96* 98  CO2 24 22 22 26 28   GLUCOSE 113* 113* 122* 131* 137*  BUN 47* 49* 47* 48* 47*  CREATININE 1.45* 1.27* 1.44* 1.41* 1.36*  CALCIUM 9.2 9.4 9.5 9.7 9.7  MG  --  1.9  --   --   --    GFR: Estimated Creatinine Clearance: 23.3 mL/min (A) (by C-G formula based on SCr of 1.36 mg/dL (H)). Liver Function Tests: Recent Labs  Lab 02/02/20 0309 02/03/20 0436 02/04/20 0452 02/05/20 0606 02/06/20 0341  AST 46* 41 35 28 26  ALT 21 20 23 22 21   ALKPHOS 60 49 52 49 54  BILITOT 0.7 0.7 0.9 0.8 1.0  PROT 6.9 6.4* 6.3* 6.4* 6.7  ALBUMIN 3.4* 3.0* 3.0* 3.0* 3.1*   No results for input(s): LIPASE, AMYLASE in the last 168 hours. No results for input(s): AMMONIA in the last 168 hours. Coagulation Profile: No results for input(s): INR, PROTIME in the last 168 hours. Cardiac Enzymes: No results for input(s): CKTOTAL, CKMB, CKMBINDEX, TROPONINI in the last 168 hours. BNP (last 3 results) No results for input(s): PROBNP in the last 8760  hours. HbA1C: No results for input(s): HGBA1C in the last 72 hours. CBG: Recent Labs  Lab 02/02/20 2256  GLUCAP 113*   Lipid Profile: No results for input(s): CHOL, HDL, LDLCALC, TRIG, CHOLHDL, LDLDIRECT in the last 72 hours. Thyroid Function Tests: No results for input(s): TSH, T4TOTAL, FREET4, T3FREE, THYROIDAB in the last 72 hours. Anemia Panel: No results for input(s): VITAMINB12, FOLATE, FERRITIN, TIBC, IRON, RETICCTPCT in the last 72 hours. Urine analysis:    Component Value Date/Time   COLORURINE Yellow 03/08/2013 1210   APPEARANCEUR Clear 03/08/2013 1210   LABSPEC 1.017 03/08/2013 1210   PHURINE 5.0 03/08/2013 1210   GLUCOSEU Negative 03/08/2013 1210   HGBUR 1+ 03/08/2013 1210   BILIRUBINUR Negative 03/08/2013 1210   KETONESUR Negative 03/08/2013 1210   PROTEINUR 30 mg/dL 03/08/2013 1210   NITRITE Negative 03/08/2013 1210   LEUKOCYTESUR Negative 03/08/2013 1210   Sepsis Labs: Invalid input(s): PROCALCITONIN, LACTICIDVEN  No  results found for this or any previous visit (from the past 240 hour(s)).    Radiology Studies: No results found.   Marzetta Board, MD, PhD [facility]  Between 7 am - 7 pm I am available, please contact me via Amion or Securechat  Between 7 pm - 7 am I am not available, please contact night coverage MD/APP via Amion

## 2020-02-09 DIAGNOSIS — U071 COVID-19: Secondary | ICD-10-CM | POA: Diagnosis not present

## 2020-02-09 DIAGNOSIS — Z515 Encounter for palliative care: Secondary | ICD-10-CM | POA: Diagnosis not present

## 2020-02-09 DIAGNOSIS — J9601 Acute respiratory failure with hypoxia: Secondary | ICD-10-CM | POA: Diagnosis not present

## 2020-02-09 MED ORDER — MORPHINE 100MG IN NS 100ML (1MG/ML) PREMIX INFUSION
2.0000 mg/h | INTRAVENOUS | Status: DC
  Administered 2020-02-09: 2 mg/h via INTRAVENOUS
  Filled 2020-02-09: qty 100

## 2020-02-09 MED ORDER — MORPHINE BOLUS VIA INFUSION: 2.0000 mg | INTRAVENOUS | Status: DC | PRN

## 2020-02-09 MED ORDER — MORPHINE BOLUS VIA INFUSION
2.0000 mg | INTRAVENOUS | Status: DC | PRN
  Filled 2020-02-09: qty 4

## 2020-02-09 NOTE — Care Management Important Message (Signed)
Important Message  Patient Details  Name: [name] MRN: [id number] Date of Birth: [birth year]   Medicare Important Message Given:  Other (see comment)  Per chart, on comfort care measures.  Medicare IM not given at this time out of respect for patient and family.   Dannette Barbara 02/09/2020, 1:57 PM

## 2020-02-09 NOTE — Progress Notes (Signed)
PROGRESS NOTE  Cynthia Terry VQQ:[id number] DOB: May 01, 1924 DOA: 02/29/2020 PCP: Valera Castle, MD   LOS: 9 days   Brief Narrative / Interim history: [name], presented to the emergency room with acute onset of worsening dyspnea with associateddry cough and occasional wheezing over the last couple weeks. She admitted to fatigue and tiredness and has not had much appetite. She has been feeling sleepy. Upon arriving the emergency room, she was tested positive for Covid. She was found to be hypoxic and was admitted to the hospital   Subjective / 24h Interval events: Unresponsive  Assessment & Plan:  Principal Problem Acute Hypoxic Respiratory Failure due to Covid-19 Viral Illness -Patient with multifocal pneumonia secondary to COVID-19 infection. Family refused Remdesivir she was started on steroids along with baricitinib -Despite treatment she continues to decline, remains on significant oxygen support.  Palliative care consulted, she is DNR, no further escalation of care.  Progressively getting worse, now transitioned to comfort measures.  Anticipating hospital death  Active Problems Chronic A. Fib  Chronic kidney disease stage III b Non-STEMI Hyponatremia   Scheduled Meds: . ammonium lactate   Topical BID  . guaiFENesin  600 mg Oral BID   Continuous Infusions: PRN Meds:.acetaminophen **OR** acetaminophen, albuterol, antiseptic oral rinse, chlorpheniramine-HYDROcodone, glycopyrrolate **OR** glycopyrrolate **OR** glycopyrrolate, guaiFENesin-dextromethorphan, haloperidol **OR** haloperidol **OR** haloperidol lactate, Ipratropium-Albuterol, LORazepam **OR** LORazepam **OR** LORazepam, magnesium hydroxide, morphine injection, ondansetron **OR** ondansetron (ZOFRAN) IV, polyvinyl alcohol  DVT prophylaxis: Xarelto Code Status: DNR Family Communication: No family at bedside  this morning   Status is: Inpatient  Remains inpatient appropriate because:Inpatient level of care appropriate due to severity of illness   Dispo: The patient is from: Home              Anticipated d/c is to: Home              Anticipated d/c date is: > 3 days              Patient currently is not medically stable to d/c.  Consultants:  Cardiology   Procedures:  2D echo:  1. Left ventricular ejection fraction, by estimation, is 55 to 60%. The left ventricle has normal function. The left ventricle has no regional wall motion abnormalities. Left ventricular diastolic parameters were normal.  2. Right ventricular systolic function is normal. The right ventricular size is normal.  3. Left atrial size was moderately dilated.  4. Right atrial size was moderately dilated.  5. The mitral valve is normal in structure. Moderate mitral valve regurgitation.  6. Tricuspid valve regurgitation is moderate.  7. The aortic valve is normal in structure. Aortic valve regurgitation is mild.   Microbiology: None   Antibacterials: Ceftriaxone / Azithromycin 1/3-1/4   Objective: Vitals:   02/06/20 1942 02/07/20 0800 02/08/20 0032 02/08/20 1700  BP: (!) 155/80 (!) 142/64 (!) 158/56 (!) 155/60  Pulse: 93 60 63 65  Resp: (!) 26 14 20 20   Temp: 97.8 F (36.6 C) 97.7 F (36.5 C) 97.7 F (36.5 C) 97.8 F (36.6 C)  TempSrc:   Oral Oral  SpO2: 93% 91% (!) 85% (!) 86%  Weight:      Height:        Intake/Output Summary (Last 24 hours) at 02/09/2020 1056 Last data filed at 02/09/2020 0645 Gross per 24 hour  Intake 0 ml  Output 350 ml  Net -350 ml   Filed Weights   02/27/2020 1724  Weight: 74.8 kg    Examination:  Constitutional: Unresponsive  Data Reviewed: I have independently reviewed following labs and imaging studies   CBC: Recent Labs  Lab 02/03/20 0436 02/04/20 0452 02/05/20 0606 02/06/20 0341  WBC 7.1 7.1 11.1* 13.3*  NEUTROABS 6.3 6.5 10.2*  --   HGB 12.0 11.9*  12.1 12.8  HCT 33.0* 33.8* 34.6* 37.8  MCV 91.4 91.6 93.3 93.8  PLT 231 232 224 858   Basic Metabolic Panel: Recent Labs  Lab 02/03/20 0436 02/04/20 0452 02/05/20 0606 02/06/20 0341  NA 132* 132* 133* 135  K 4.5 4.5 4.5 4.5  CL 99 98 96* 98  CO2 22 22 26 28   GLUCOSE 113* 122* 131* 137*  BUN 49* 47* 48* 47*  CREATININE 1.27* 1.44* 1.41* 1.36*  CALCIUM 9.4 9.5 9.7 9.7  MG 1.9  --   --   --    GFR: Estimated Creatinine Clearance: 23.3 mL/min (A) (by C-G formula based on SCr of 1.36 mg/dL (H)). Liver Function Tests: Recent Labs  Lab 02/03/20 0436 02/04/20 0452 02/05/20 0606 02/06/20 0341  AST 41 35 28 26  ALT 20 23 22 21   ALKPHOS 49 52 49 54  BILITOT 0.7 0.9 0.8 1.0  PROT 6.4* 6.3* 6.4* 6.7  ALBUMIN 3.0* 3.0* 3.0* 3.1*   No results for input(s): LIPASE, AMYLASE in the last 168 hours. No results for input(s): AMMONIA in the last 168 hours. Coagulation Profile: No results for input(s): INR, PROTIME in the last 168 hours. Cardiac Enzymes: No results for input(s): CKTOTAL, CKMB, CKMBINDEX, TROPONINI in the last 168 hours. BNP (last 3 results) No results for input(s): PROBNP in the last 8760 hours. HbA1C: No results for input(s): HGBA1C in the last 72 hours. CBG: Recent Labs  Lab 02/02/20 2256  GLUCAP 113*   Lipid Profile: No results for input(s): CHOL, HDL, LDLCALC, TRIG, CHOLHDL, LDLDIRECT in the last 72 hours. Thyroid Function Tests: No results for input(s): TSH, T4TOTAL, FREET4, T3FREE, THYROIDAB in the last 72 hours. Anemia Panel: No results for input(s): VITAMINB12, FOLATE, FERRITIN, TIBC, IRON, RETICCTPCT in the last 72 hours. Urine analysis:    Component Value Date/Time   COLORURINE Yellow 03/08/2013 1210   APPEARANCEUR Clear 03/08/2013 1210   LABSPEC 1.017 03/08/2013 1210   PHURINE 5.0 03/08/2013 1210   GLUCOSEU Negative 03/08/2013 1210   HGBUR 1+ 03/08/2013 1210   BILIRUBINUR Negative 03/08/2013 1210   KETONESUR Negative 03/08/2013 1210    PROTEINUR 30 mg/dL 03/08/2013 1210   NITRITE Negative 03/08/2013 1210   LEUKOCYTESUR Negative 03/08/2013 1210   Sepsis Labs: Invalid input(s): PROCALCITONIN, LACTICIDVEN  No results found for this or any previous visit (from the past 240 hour(s)).    Radiology Studies: No results found.   Marzetta Board, MD, PhD [facility]  Between 7 am - 7 pm I am available, please contact me via Amion or Securechat  Between 7 pm - 7 am I am not available, please contact night coverage MD/APP via Amion

## 2020-02-09 NOTE — Progress Notes (Signed)
Palliative:  HPI:87 y.o.femalewith past medical history of chronic atrial fibrillation on Xarelto, hypertension, CKD stage 3badmitted on 1/1/2022with worsening dyspnea and dry cough with wheezing over past couple weeks accompanied with fatigue and poor appetite.Admitted with COVID and multifocal pneumonia with NSTEMI and hyponatremia.  I met today initially with Ms. Hodsdon's daughter and then with son. I discussed with them both about initiating morphine infusion and they both agree. She is having increasing labored breathing and does not appear comfortable at all. They do not want her to suffer and want her to be comfortable. They also do not want her prolonged in this state and feel is she is at end of life we let her go and be in peace and not do anything to keep her here any longer. We discussed that we will titrate down to off her oxygen once morphine has begun and she appears comfortable. [name] agrees with plan. He finds reassurance that she will be in heaven and out of suffering and at peace.   All questions/concerns addressed. Emotional support provided. Discussed with Dr. Renne Crigler and RN Caryl Pina.   Exam: Attempts to open eyes at times but mostly unresponsive. Pink/flushed cheeks. Breathing labored with accessory muscle use. Abd soft.   Plan: - Full comfort care.  - Morphine infusion ordered for comfort. - Titrate down to off oxygen to allow for natural death (titrate down as long as she appears comfortable with morphine). No oxygen sat low limits.   20 min  Vinie Sill, NP Palliative Medicine Team Pager [phone] (Please see [email or web address] for schedule) Team Phone [phone]    Greater than 50%  of this time was spent counseling and coordinating care related to the above assessment and plan

## 2020-02-10 DIAGNOSIS — J9601 Acute respiratory failure with hypoxia: Secondary | ICD-10-CM | POA: Diagnosis not present

## 2020-02-10 DIAGNOSIS — U071 COVID-19: Secondary | ICD-10-CM | POA: Diagnosis not present

## 2020-02-10 DIAGNOSIS — J80 Acute respiratory distress syndrome: Secondary | ICD-10-CM

## 2020-03-02 NOTE — Progress Notes (Signed)
PROGRESS NOTE  Cynthia Terry POE:[id number] DOB: 1924-10-30 DOA: 02/29/2020 PCP: Valera Castle, MD   LOS: 10 days   Brief Narrative / Interim history: [name], presented to the emergency room with acute onset of worsening dyspnea with associateddry cough and occasional wheezing over the last couple weeks. She admitted to fatigue and tiredness and has not had much appetite. She has been feeling sleepy. Upon arriving the emergency room, she was tested positive for Covid. She was found to be hypoxic and was admitted to the hospital   Subjective / 24h Interval events: Unresponsive this morning  Assessment & Plan:  Principal Problem Acute Hypoxic Respiratory Failure due to Covid-19 Viral Illness -Patient with multifocal pneumonia secondary to COVID-19 infection. Family refused Remdesivir she was started on steroids along with baricitinib. Despite treatment she continues to decline, remains on significant oxygen support.  Palliative care consulted, she is DNR, no further escalation of care.  Progressively getting worse, now transitioned to comfort measures.  Anticipating hospital death  Active Problems Chronic A. Fib  Chronic kidney disease stage III b Non-STEMI Hyponatremia Hypochloremia Hyperglycemia Leukopenia/leukocytosis Anemia of chronic disease  Scheduled Meds: . ammonium lactate   Topical BID   Continuous Infusions: . morphine 3 mg/hr (2020/03/11 0621)   PRN Meds:.acetaminophen **OR** acetaminophen, albuterol, antiseptic oral rinse, chlorpheniramine-HYDROcodone, glycopyrrolate **OR** glycopyrrolate **OR** glycopyrrolate, guaiFENesin-dextromethorphan, haloperidol **OR** haloperidol **OR** haloperidol lactate, Ipratropium-Albuterol, LORazepam **OR** LORazepam **OR** LORazepam, morphine, ondansetron **OR** ondansetron (ZOFRAN) IV, polyvinyl alcohol  DVT prophylaxis:  Xarelto Code Status: DNR Family Communication: No family at bedside this morning   Status is: Inpatient  Remains inpatient appropriate because:Inpatient level of care appropriate due to severity of illness   Dispo: The patient is from: Home              Anticipated d/c is to: Home              Anticipated d/c date is: > 3 days              Patient currently is not medically stable to d/c.  Consultants:  Cardiology   Procedures:  2D echo:  1. Left ventricular ejection fraction, by estimation, is 55 to 60%. The left ventricle has normal function. The left ventricle has no regional wall motion abnormalities. Left ventricular diastolic parameters were normal.  2. Right ventricular systolic function is normal. The right ventricular size is normal.  3. Left atrial size was moderately dilated.  4. Right atrial size was moderately dilated.  5. The mitral valve is normal in structure. Moderate mitral valve regurgitation.  6. Tricuspid valve regurgitation is moderate.  7. The aortic valve is normal in structure. Aortic valve regurgitation is mild.   Microbiology: None   Antibacterials: Ceftriaxone / Azithromycin 1/3-1/4   Objective: Vitals:   02/07/20 0800 02/08/20 0032 02/08/20 1700 02/09/20 1551  BP: (!) 142/64 (!) 158/56 (!) 155/60 123/66  Pulse: 60 63 65 95  Resp: 14 20 20 17   Temp: 97.7 F (36.5 C) 97.7 F (36.5 C) 97.8 F (36.6 C) (!) 97.1 F (36.2 C)  TempSrc:  Oral Oral   SpO2: 91% (!) 85% (!) 86% (!) 79%  Weight:      Height:        Intake/Output Summary (Last 24 hours) at 2020/03/11 0926 Last data filed at 03/11/20 3154 Gross per 24 hour  Intake 33.67 ml  Output 400 ml  Net -366.33 ml   [employer]  26-Feb-2020 1724  Weight: 74.8 kg    Examination:  Constitutional: Unresponsive  Marzetta Board, MD, PhD [facility]  Between 7 am - 7 pm I am available, please contact me via Amion or Securechat  Between 7 pm - 7 am I am not available,  please contact night coverage MD/APP via Amion

## 2020-03-02 NOTE — Progress Notes (Signed)
  Chaplain On-Call was paged with report of patient's death.  Met patient's daughter, and patient's son and his wife, at the bedside.  Provided opportunity for life review as they recalled special memories of the patient.  Provided spiritual and emotional support and prayer.  [place] Ravi Tuccillo M.Div., [facility]

## 2020-03-02 NOTE — Death Summary Note (Addendum)
Death Summary  Cynthia Terry OVF:[id number] DOB: 04-07-1924 DOA: February 25, 2020  PCP: Valera Castle, MD  Admit date: 25-Feb-2020 Date of Death: 03/06/2020 Time of Death: 11:15 am Notification: Valera Castle, MD notified of death of 03-06-20   History of present illness:  [name] a87 y.o.Caucasian femalewith a known history of chronicatrial fibrillation and hypertension, presented to the emergency room with acute onset of worsening dyspnea with associateddry cough and occasional wheezing over the last couple weeks. She admitted to fatigue and tiredness and has not had much appetite. She has been feeling sleepy. Upon arriving the emergency room, she was tested positive for Covid. She was found to be hypoxic and was admitted to the hospital.   Final Diagnoses:  Acute hypoxic respiratory failure due to COVID-19, ARDSPatient with multifocal pneumonia secondary to COVID-19 infection. Family refused Remdesivir she was started on steroids along with baricitinib. Despite treatment she continues to decline, remains on significant oxygen support.  Palliative care consulted, she is DNR, no further escalation of care.  Progressively getting worse, now transitioned to comfort measures. Patient passed away March 06, 2020 at 11:15 am Chronic atrial fibrillation Chronic kidney disease stage IIIb NSTEMI Hyponatremia Hypochloremia Leukopenia/leukocytosis Anemia of chronic disease End of life care  The results of significant diagnostics from this hospitalization (including imaging, microbiology, ancillary and laboratory) are listed below for reference.    Significant Diagnostic Studies: DG Chest Portable 1 View  Result Date: 2020/02/25 CLINICAL DATA:  Shortness of breath EXAM: PORTABLE CHEST 1 VIEW COMPARISON:  12/15/2010 FINDINGS: Single lead left-sided implanted cardiac device. Stable cardiomegaly. Atherosclerotic calcification of the aortic knob. Mild diffuse interstitial prominence  bilaterally. No focal airspace consolidation. No pleural effusion or pneumothorax. Severe degenerative changes of the bilateral shoulders. Multilevel thoracic spondylosis. IMPRESSION: Cardiomegaly with mild diffuse interstitial prominence bilaterally, which may reflect mild edema. Atypical/viral infectious process not excluded. Electronically Signed   By: Davina Poke D.O.   On: Feb 25, 2020 17:49   ECHOCARDIOGRAM COMPLETE  Result Date: 02/03/2020    ECHOCARDIOGRAM REPORT   Patient Name:   Cynthia Terry Date of Exam: 02/03/2020 Medical Rec #:  [id number]     Height:       60.0 in Accession #:    [id number]    Weight:       165.0 lb Date of Birth:  November 20, 1924     BSA:          1.720 m Patient Age:    87 years      BP:           142/61 mmHg Patient Gender: F             HR:           81 bpm. Exam Location:  [facility] Procedure: 2D Echo, Color Doppler and Cardiac Doppler Indications:     I21.4 NSTEMI  History:         Patient has no prior history of Echocardiogram examinations.                  COPD. Pt tested positive for COVID-19 on Feb 25, 2019.  Sonographer:     Charmayne Sheer RDCS (AE) Referring Phys:  [id number] Sharen Hones Diagnosing Phys: Serafina Royals MD  Sonographer Comments: Suboptimal subcostal window. IMPRESSIONS  1. Left ventricular ejection fraction, by estimation, is 55 to 60%. The left ventricle has normal function. The left ventricle has no regional wall motion abnormalities. Left ventricular diastolic parameters were normal.  2. Right ventricular systolic function is normal. The  right ventricular size is normal.  3. Left atrial size was moderately dilated.  4. Right atrial size was moderately dilated.  5. The mitral valve is normal in structure. Moderate mitral valve regurgitation.  6. Tricuspid valve regurgitation is moderate.  7. The aortic valve is normal in structure. Aortic valve regurgitation is mild. FINDINGS  Left Ventricle: Left ventricular ejection fraction, by estimation, is 55 to 60%. The left ventricle  has normal function. The left ventricle has no regional wall motion abnormalities. The left ventricular internal cavity size was normal in size. There is  no left ventricular hypertrophy. Left ventricular diastolic parameters were normal. Right Ventricle: The right ventricular size is normal. No increase in right ventricular wall thickness. Right ventricular systolic function is normal. Left Atrium: Left atrial size was moderately dilated. Right Atrium: Right atrial size was moderately dilated. Pericardium: There is no evidence of pericardial effusion. Mitral Valve: The mitral valve is normal in structure. Moderate mitral valve regurgitation. MV peak gradient, 7.3 mmHg. The mean mitral valve gradient is 3.0 mmHg. Tricuspid Valve: The tricuspid valve is normal in structure. Tricuspid valve regurgitation is moderate. Aortic Valve: The aortic valve is normal in structure. Aortic valve regurgitation is mild. Aortic regurgitation PHT measures 569 msec. Aortic valve mean gradient measures 5.0 mmHg. Aortic valve peak gradient measures 9.1 mmHg. Aortic valve area, by VTI measures 1.87 cm. Pulmonic Valve: The pulmonic valve was normal in structure. Pulmonic valve regurgitation is not visualized. Aorta: The aortic root and ascending aorta are structurally normal, with no evidence of dilitation. IAS/Shunts: No atrial level shunt detected by color flow Doppler.  LEFT VENTRICLE PLAX 2D LVIDd:         4.20 cm  Diastology LVIDs:         2.90 cm  LV e' medial:    8.38 cm/s LV PW:         1.20 cm  LV E/e' medial:  13.5 LV IVS:        1.20 cm  LV e' lateral:   7.51 cm/s LVOT diam:     2.20 cm  LV E/e' lateral: 15.1 LV SV:         59 LV SV Index:   34 LVOT Area:     3.80 cm  RIGHT VENTRICLE RV Basal diam:  3.90 cm LEFT ATRIUM            Index       RIGHT ATRIUM           Index LA Vol (A4C): 154.0 ml 89.53 ml/m RA Area:     21.40 cm                                    RA Volume:   52.30 ml  30.41 ml/m  AORTIC VALVE                     PULMONIC VALVE AV Area (Vmax):    2.10 cm     PV Vmax:       1.07 m/s AV Area (Vmean):   1.94 cm     PV Vmean:      67.100 cm/s AV Area (VTI):     1.87 cm     PV VTI:        0.172 m AV Vmax:           151.00 cm/s  PV Peak grad:  4.6  mmHg AV Vmean:          104.000 cm/s PV Mean grad:  2.0 mmHg AV VTI:            0.317 m AV Peak Grad:      9.1 mmHg AV Mean Grad:      5.0 mmHg LVOT Vmax:         83.60 cm/s LVOT Vmean:        53.000 cm/s LVOT VTI:          0.156 m LVOT/AV VTI ratio: 0.49 AI PHT:            569 msec  AORTA Ao Root diam: 3.20 cm MITRAL VALVE                TRICUSPID VALVE MV Area (PHT): 4.44 cm     TR Peak grad:   40.7 mmHg MV Peak grad:  7.3 mmHg     TR Vmax:        319.00 cm/s MV Mean grad:  3.0 mmHg MV Vmax:       1.35 m/s     SHUNTS MV Vmean:      80.2 cm/s    Systemic VTI:  0.16 m MV Decel Time: 171 msec     Systemic Diam: 2.20 cm MV E velocity: 113.50 cm/s Serafina Royals MD Electronically signed by Serafina Royals MD Signature Date/Time: 02/03/2020/4:55:04 PM    Final     Microbiology: No results found for this or any previous visit (from the past 240 hour(s)).   Labs: Basic Metabolic Panel: Recent Labs  Lab 02/04/20 0452 02/05/20 0606 02/06/20 0341  NA 132* 133* 135  K 4.5 4.5 4.5  CL 98 96* 98  CO2 22 26 28   GLUCOSE 122* 131* 137*  BUN 47* 48* 47*  CREATININE 1.44* 1.41* 1.36*  CALCIUM 9.5 9.7 9.7   Liver Function Tests: Recent Labs  Lab 02/04/20 0452 02/05/20 0606 02/06/20 0341  AST 35 28 26  ALT 23 22 21   ALKPHOS 52 49 54  BILITOT 0.9 0.8 1.0  PROT 6.3* 6.4* 6.7  ALBUMIN 3.0* 3.0* 3.1*   No results for input(s): LIPASE, AMYLASE in the last 168 hours. No results for input(s): AMMONIA in the last 168 hours. CBC: Recent Labs  Lab 02/04/20 0452 02/05/20 0606 02/06/20 0341  WBC 7.1 11.1* 13.3*  NEUTROABS 6.5 10.2*  --   HGB 11.9* 12.1 12.8  HCT 33.8* 34.6* 37.8  MCV 91.6 93.3 93.8  PLT 232 224 258   Cardiac Enzymes: No results for input(s):  CKTOTAL, CKMB, CKMBINDEX, TROPONINI in the last 168 hours. D-Dimer No results for input(s): DDIMER in the last 72 hours. BNP: Invalid input(s): POCBNP CBG: No results for input(s): GLUCAP in the last 168 hours. Anemia work up No results for input(s): VITAMINB12, FOLATE, FERRITIN, TIBC, IRON, RETICCTPCT in the last 72 hours. Urinalysis    Component Value Date/Time   COLORURINE Yellow 03/08/2013 1210   APPEARANCEUR Clear 03/08/2013 1210   LABSPEC 1.017 03/08/2013 1210   PHURINE 5.0 03/08/2013 1210   GLUCOSEU Negative 03/08/2013 1210   HGBUR 1+ 03/08/2013 1210   BILIRUBINUR Negative 03/08/2013 1210   KETONESUR Negative 03/08/2013 1210   PROTEINUR 30 mg/dL 03/08/2013 1210   NITRITE Negative 03/08/2013 1210   LEUKOCYTESUR Negative 03/08/2013 1210   Sepsis Labs Invalid input(s): PROCALCITONIN,  WBC,  LACTICIDVEN   SIGNED:  Marzetta Board, MD  [facility] 02/09/2020, 1:13 PM Pager   If 7PM-7AM, please contact night-coverage  [email or web address] Password [email or web address]

## 2020-03-02 NOTE — Progress Notes (Signed)
Family called out for a nurse to come in and check patient. Patient was comfort care. Patient was absent of vital signs. Patient was pronounced at 11:15 a.m. by two RNs. MD notified.

## 2020-03-02 DEATH — deceased

## 2021-08-11 IMAGING — US US EXTREM LOW VENOUS*L*
1 series · 13 of 24 positions shown · non-contrast
Comparison: None.

CLINICAL DATA: Left lower extremity pain and swelling

EXAM:
LEFT LOWER EXTREMITY VENOUS DOPPLER ULTRASOUND
TECHNIQUE: Gray-scale sonography with compression, as well as color and duplex
ultrasound, were performed to evaluate the deep venous system(s)
from the level of the common femoral vein through the popliteal and
proximal calf veins.

[Series 1: us venous img lower uni left (dvt) · portal-venous · 13 of 44 slices shown]
[im 1/44]
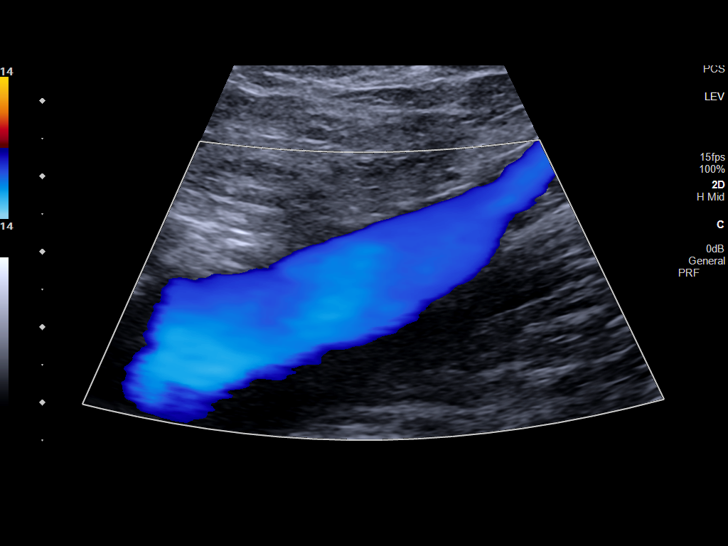
[im 4/44]
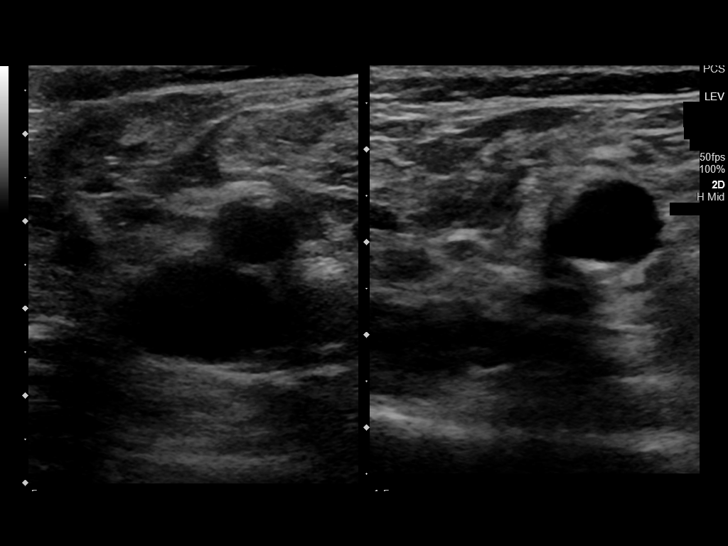
[im 8/44]
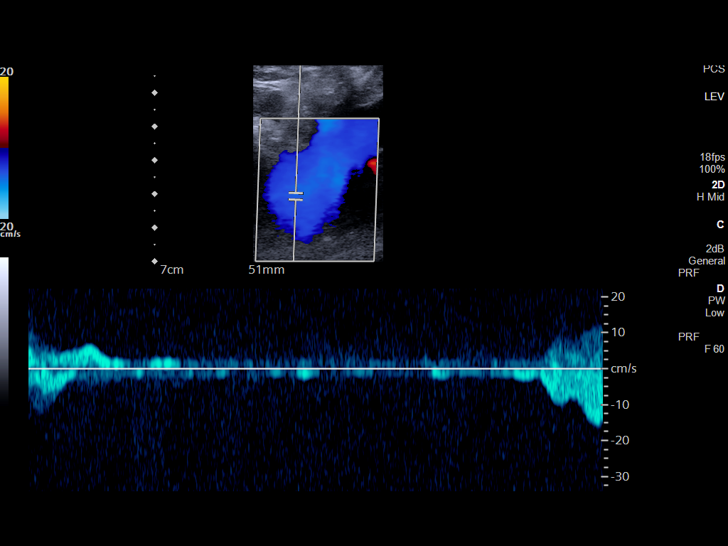
[im 12/44]
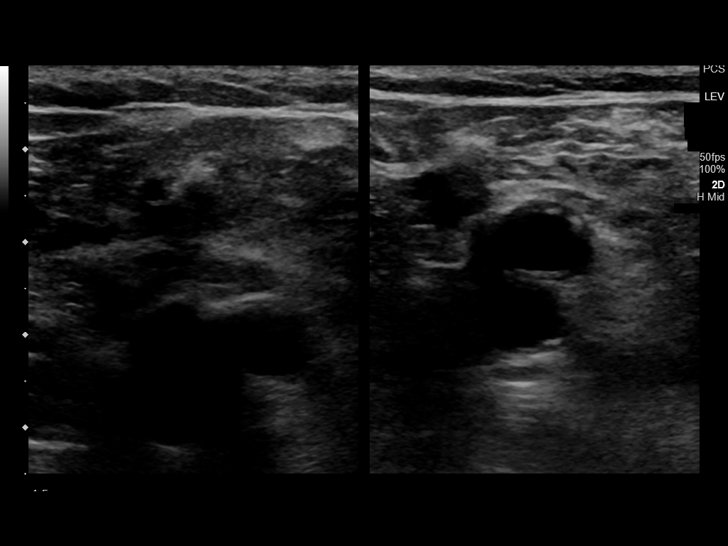
[im 15/44]
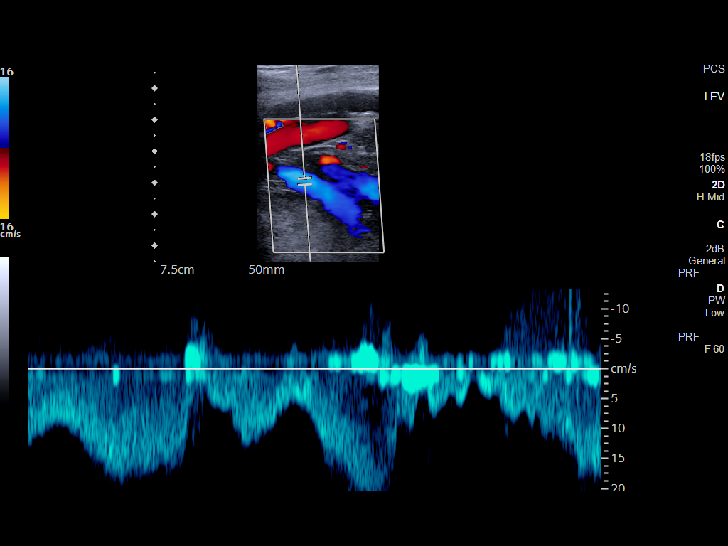
[im 19/44]
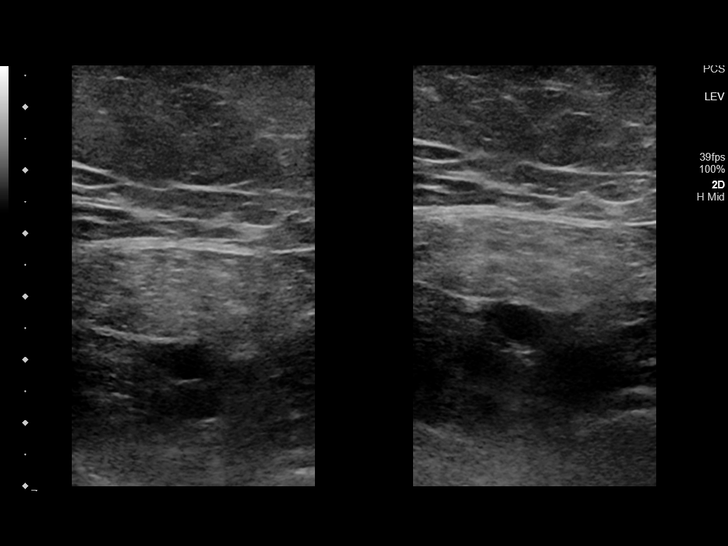
[im 23/44]
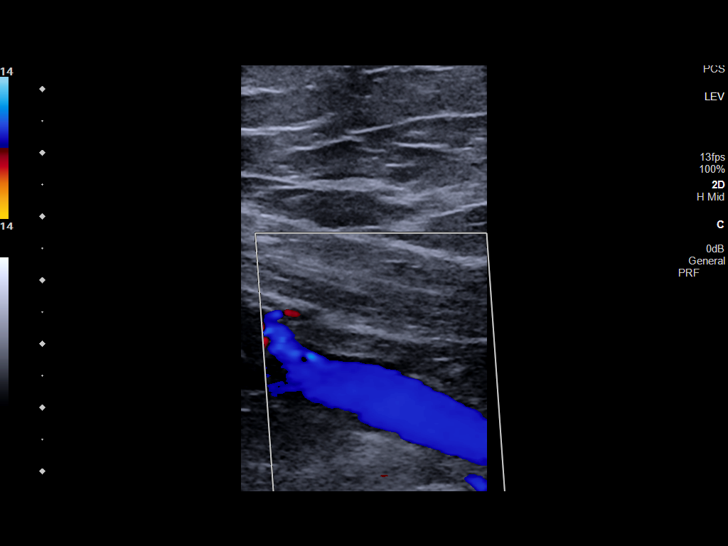
[im 25/44]
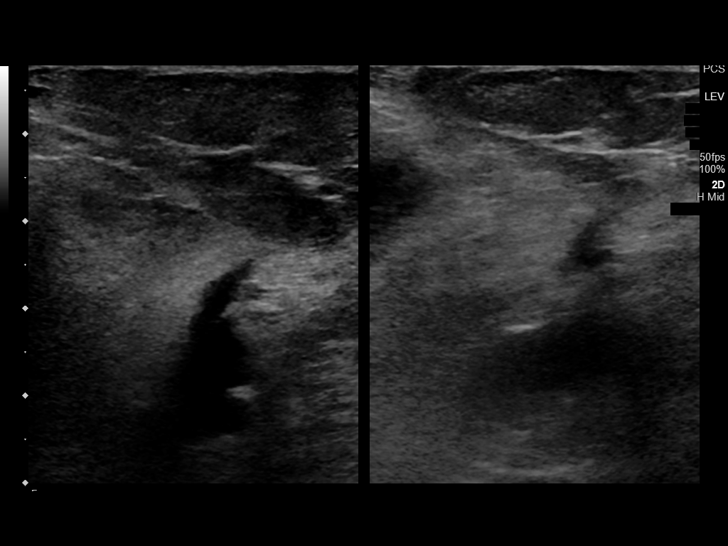
[im 29/44]
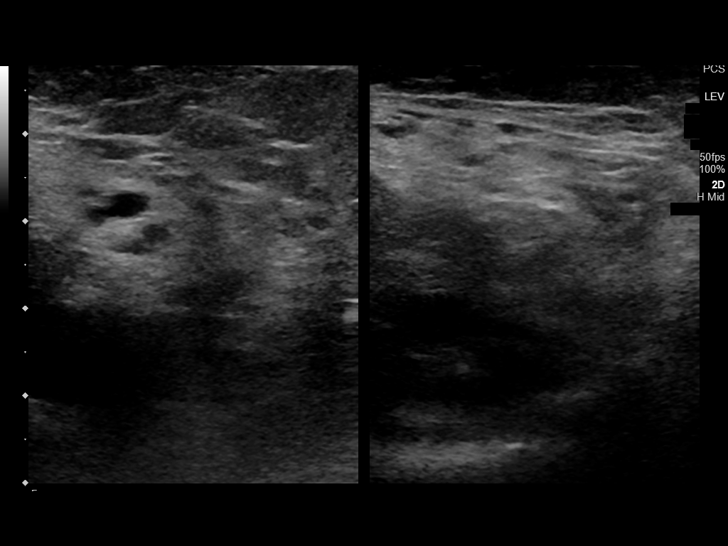
[im 32/44]
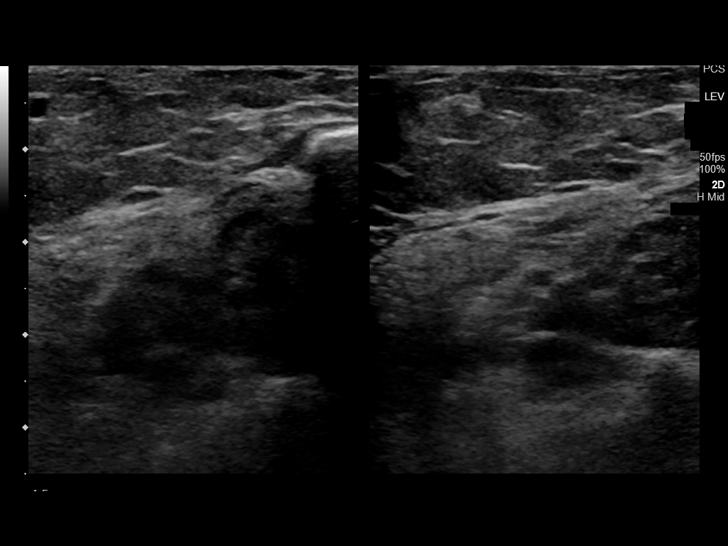
[im 36/44]
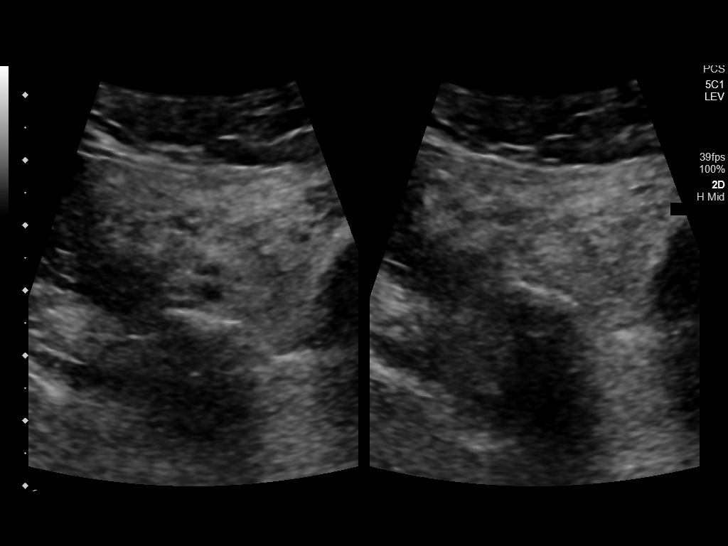
[im 40/44]
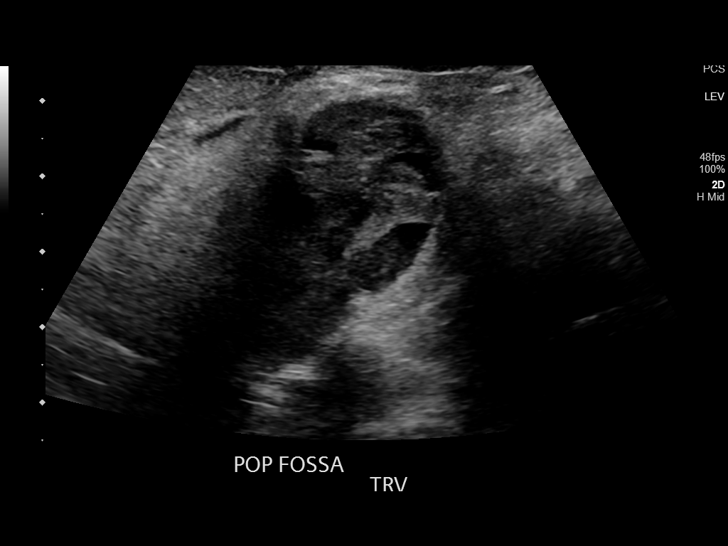
[im 44/44]
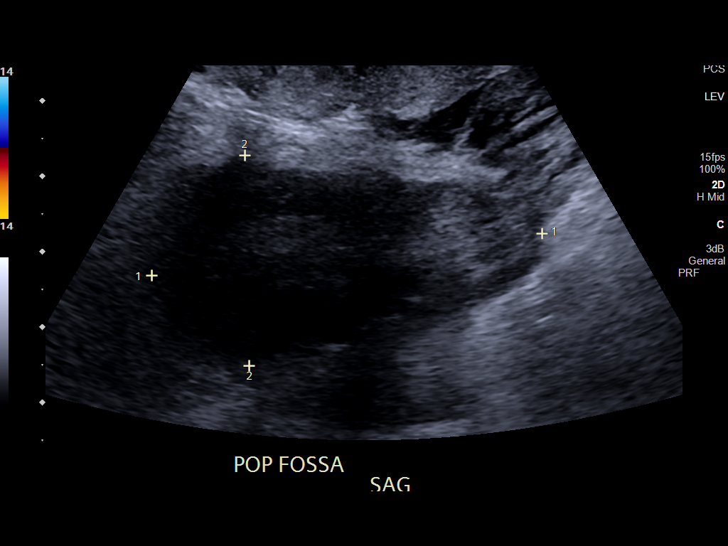

[13 of 24 positions shown; findings below may reference images not displayed]

FINDINGS: VENOUS

Normal compressibility of the common femoral, superficial femoral,
and popliteal veins, as well as the visualized calf veins.
Visualized portions of profunda femoral vein and great saphenous
vein unremarkable. No filling defects to suggest DVT on grayscale or
color Doppler imaging. Doppler waveforms show normal direction of
venous flow, normal respiratory plasticity and response to
augmentation.

Limited views of the contralateral common femoral vein are
unremarkable.

OTHER

Large and highly complex cystic collection is present in the
popliteal fossa measuring approximately 5.2 x 2.8 x 2.6 cm. No
evidence of internal vascularity.

Limitations: none
IMPRESSION: 1. Negative for left lower extremity deep venous thrombosis.
2. Positive for complex cystic mass in the popliteal fossa which is
favored to represent a complex Baker's cyst. The internal components
are either inspissated and partially solidified, or the patient
experienced hemorrhage into the cyst with resultant thrombus
formation.

## 2022-02-10 IMAGING — DX DG CHEST 1V PORT
1 series · 1 of 1 positions shown · non-contrast
Comparison: 12/15/2010

CLINICAL DATA: Shortness of breath

EXAM:
PORTABLE CHEST 1 VIEW

[chest ap]
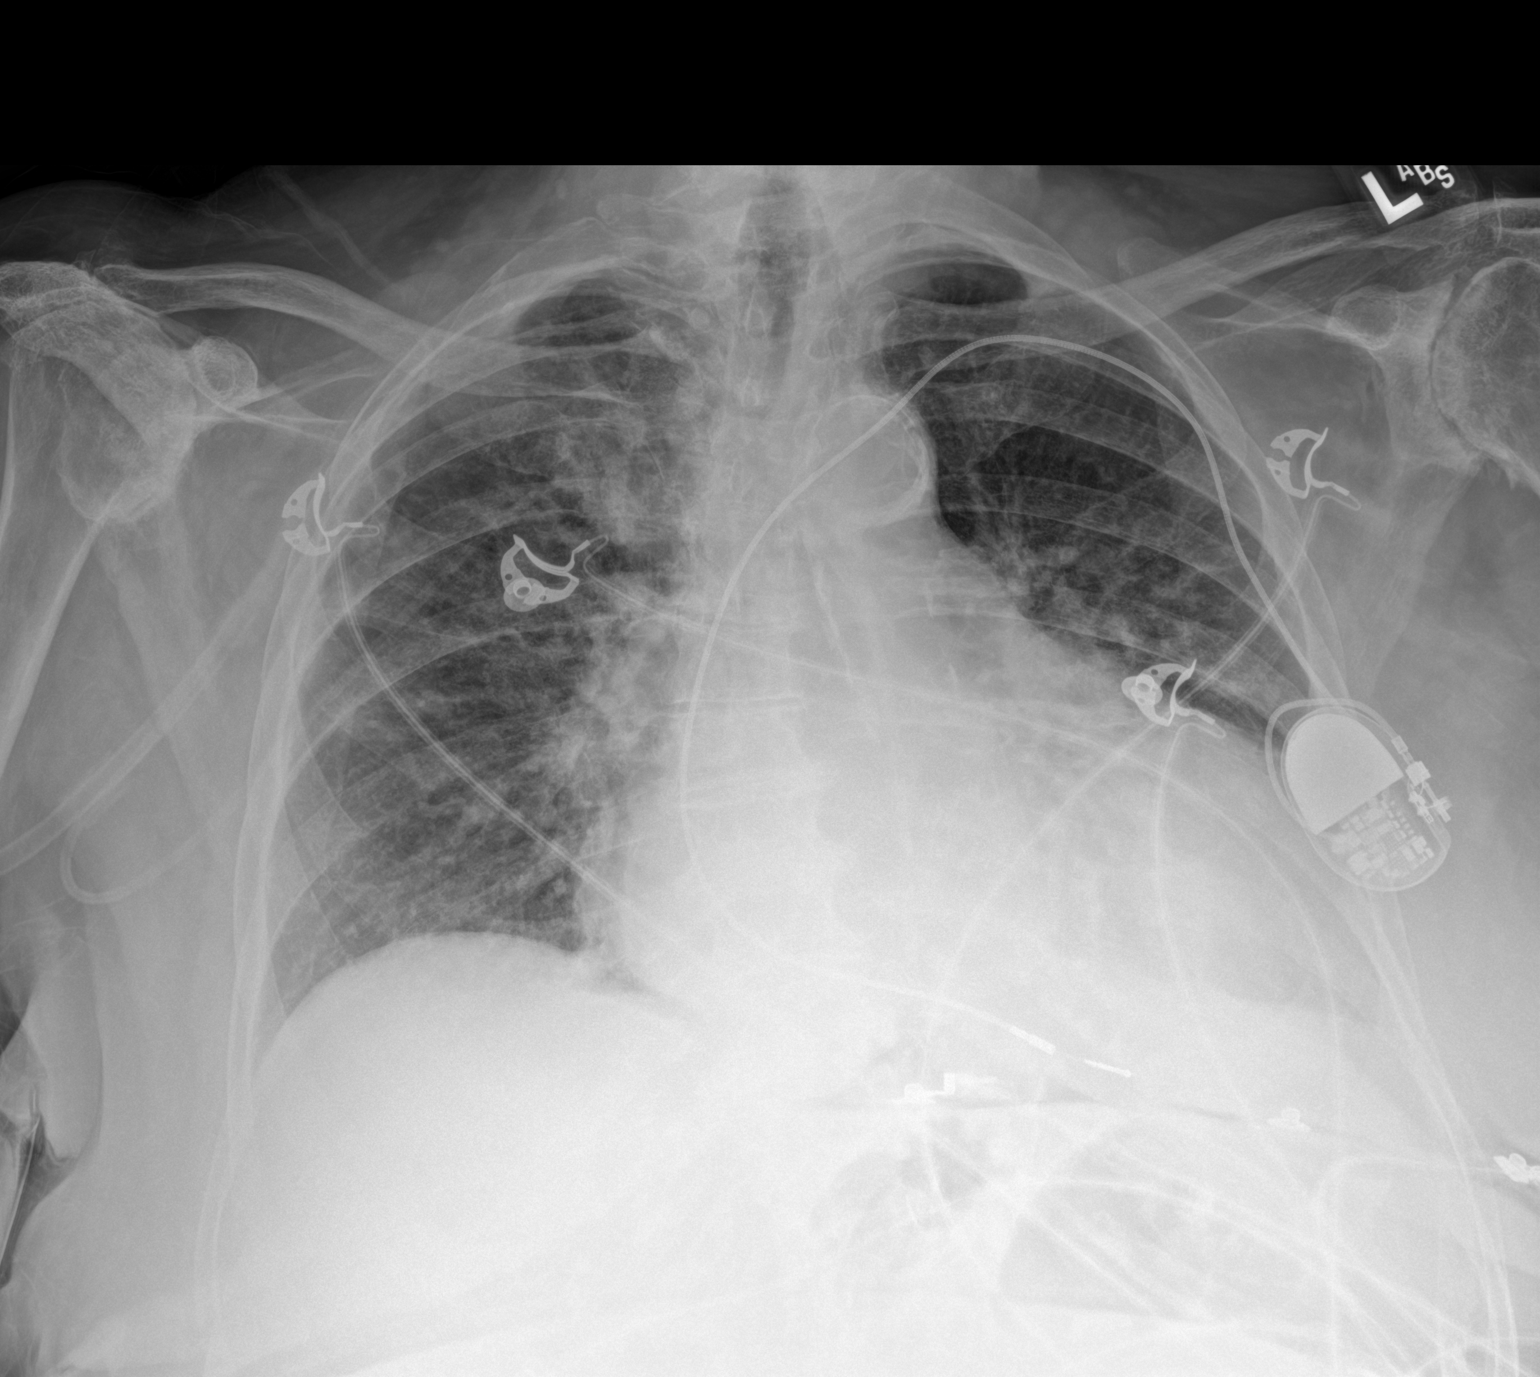

[1 of 1 positions shown; findings below may reference images not displayed]

FINDINGS: Single lead left-sided implanted cardiac device. Stable
cardiomegaly. Atherosclerotic calcification of the aortic knob. Mild
diffuse interstitial prominence bilaterally. No focal airspace
consolidation. No pleural effusion or pneumothorax. Severe
degenerative changes of the bilateral shoulders. Multilevel thoracic
spondylosis.
IMPRESSION: Cardiomegaly with mild diffuse interstitial prominence bilaterally,
which may reflect mild edema. Atypical/viral infectious process not
excluded.
